# Patient Record
Sex: Female | Born: 1965 | Race: White | Hispanic: No | Marital: Married | Smoking: Never smoker
Health system: Southern US, Community
[De-identification: ages and names within clinical notes are randomized; demographics above are authoritative.]

## PROBLEM LIST (undated history)

## (undated) DIAGNOSIS — Z973 Presence of spectacles and contact lenses: Secondary | ICD-10-CM

## (undated) DIAGNOSIS — Z8619 Personal history of other infectious and parasitic diseases: Secondary | ICD-10-CM

## (undated) DIAGNOSIS — D649 Anemia, unspecified: Secondary | ICD-10-CM

## (undated) DIAGNOSIS — H3320 Serous retinal detachment, unspecified eye: Secondary | ICD-10-CM

## (undated) DIAGNOSIS — J189 Pneumonia, unspecified organism: Secondary | ICD-10-CM

## (undated) DIAGNOSIS — H9191 Unspecified hearing loss, right ear: Secondary | ICD-10-CM

## (undated) DIAGNOSIS — K219 Gastro-esophageal reflux disease without esophagitis: Secondary | ICD-10-CM

## (undated) DIAGNOSIS — H269 Unspecified cataract: Secondary | ICD-10-CM

## (undated) HISTORY — PX: OTHER SURGICAL HISTORY: SHX169

## (undated) HISTORY — PX: ABLATION: SHX5711

## (undated) HISTORY — DX: Unspecified cataract: H26.9

## (undated) HISTORY — PX: EXPLORATORY LAPAROTOMY: SUR591

## (undated) HISTORY — DX: Personal history of other infectious and parasitic diseases: Z86.19

## (undated) HISTORY — PX: ABDOMINAL EXPLORATION SURGERY: SHX538

## (undated) HISTORY — PX: DENTAL SURGERY: SHX609

## (undated) HISTORY — DX: Serous retinal detachment, unspecified eye: H33.20

## (undated) HISTORY — DX: Anemia, unspecified: D64.9

## (undated) HISTORY — DX: Unspecified hearing loss, right ear: H91.91

---

## 2014-12-26 ENCOUNTER — Encounter: Payer: Self-pay | Admitting: Family Medicine

## 2014-12-26 ENCOUNTER — Ambulatory Visit (INDEPENDENT_AMBULATORY_CARE_PROVIDER_SITE_OTHER): Payer: Federal, State, Local not specified - PPO | Admitting: Family Medicine

## 2014-12-26 VITALS — BP 129/84 | HR 90 | Temp 98.2°F | Resp 20 | Ht 62.75 in | Wt 162.5 lb

## 2014-12-26 DIAGNOSIS — E669 Obesity, unspecified: Secondary | ICD-10-CM | POA: Insufficient documentation

## 2014-12-26 DIAGNOSIS — R5382 Chronic fatigue, unspecified: Secondary | ICD-10-CM

## 2014-12-26 DIAGNOSIS — L659 Nonscarring hair loss, unspecified: Secondary | ICD-10-CM | POA: Diagnosis not present

## 2014-12-26 DIAGNOSIS — R5383 Other fatigue: Secondary | ICD-10-CM | POA: Insufficient documentation

## 2014-12-26 DIAGNOSIS — Z6829 Body mass index (BMI) 29.0-29.9, adult: Secondary | ICD-10-CM | POA: Diagnosis not present

## 2014-12-26 DIAGNOSIS — Z299 Encounter for prophylactic measures, unspecified: Secondary | ICD-10-CM

## 2014-12-26 DIAGNOSIS — Z0000 Encounter for general adult medical examination without abnormal findings: Secondary | ICD-10-CM | POA: Insufficient documentation

## 2014-12-26 DIAGNOSIS — E611 Iron deficiency: Secondary | ICD-10-CM | POA: Insufficient documentation

## 2014-12-26 NOTE — Progress Notes (Signed)
Subjective:    Patient ID: Gail Duncan, female    DOB: [birth year], 49 y.o.   MRN: [id number]  HPI  Patient presents for new patient establishment with multiple complaints. All past medical history, surgical history, allergies, family history, immunizations and social history was obtained from the patient today and entered into the electronic medical record. Records are requested from her prior PCP, and will be reviewed at the time they are received. All medical records will be updated at that time.  Dysphagia: Patient states that she has difficulty at times swallowing her meals. She states this is intermittent in nature. She states happened a few days ago when she took a bite of chicken it felt like it got caught in the bottom portion, points to lower third esophagus, hung in location for a few minutes, and then she was able to swallow completely. Patient denies choking on solids or liquids.  Iron deficiency: Patient states that she has had a chronic history of being iron deficient. She had uterine ablation surgery in 2014, and feels this has improved her iron deficiency. She stopped taking her iron pills a few weeks ago, she was taking iron at least 4-5 times a week. She is now just taking a multivitamin. Patient reports her menstrual cycles occur every 28 days, last for 3 days are light. She does admit to a 1 day of spotting around day 15 of her period.  Fatigue: Patient reports fatigue that has been worsening over the last few months. She endorses hair loss. She denies constipation, or increasing dry skin. She does have history of iron deficiency anemia, she was on iron supplementation until 1-2 weeks ago. She does take a daily vitamin. She does exercise. 3 times a week. She states she eats a healthy diet. She denies any chest pain, admits to not feeling like she can't catch her breath fully at times, she denies any dyspnea on exertion, lower leg edema, palpitations, constipation or  diarrhea.  Health maintenance:  Colonoscopy: 2012, screened for bleeding and internal hemorrhoids. Next due at age 64.  Mammogram: 2015,  Indicated, a family history, no personal history Cervical cancer screening: 2012, patient states she has had history of abnormal Paps. She has had at least 2 normal Paps consecutively. Her last Pap smear was in 2012. She is searching for a gynecologist in the area. Immunizations: need records, flu indicated Infectious disease screening: HIV indicated  Past Medical History  Diagnosis Date  . Anemia   . History of chicken pox    Allergies  Allergen Reactions  . Asa [Aspirin] Other (See Comments)    Contraindicated due to medical condition   Past Surgical History  Procedure Laterality Date  . Abdominal exploration surgery    . Ablation    . Dental surgery    . Exploratory laparotomy      "fibrous"   Family History  Problem Relation Age of Onset  . Diabetes Mother   . Prostate cancer Father   . Hypertension Father   . Lung cancer Paternal Grandfather    Social History   Social History  . Marital Status: Married    Spouse Name: N/A  . Number of Children: N/A  . Years of Education: N/A   Occupational History  . Not on file.   Social History Main Topics  . Smoking status: Never Smoker   . Smokeless tobacco: Not on file  . Alcohol Use: 0.0 oz/week    0 Standard drinks or equivalent per week  Comment: occasional social use  . Drug Use: No  . Sexual Activity: Yes   Other Topics Concern  . 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 Bachelor's degree    Moved to [place], from [place] in July 2016.    EtOH use occasional,, nonsmoker, denies recreational drugs.   Drinks caffeinated beverages, uses herbal remedies, takes daily vitamins   Patient wears her seatbelt, wears a bike,, exercises at least 3 times a week. There is a smoke alarm in her home.   Review of Systems  Constitutional:  Positive for fatigue. Negative for fever, chills, diaphoresis, activity change, appetite change and unexpected weight change.  HENT: Positive for hearing loss and trouble swallowing. Negative for congestion, dental problem, mouth sores, nosebleeds, postnasal drip, rhinorrhea, sinus pressure, sneezing, sore throat, tinnitus and voice change.   Eyes: Negative for redness and visual disturbance.  Respiratory: Positive for chest tightness. Negative for apnea, cough, choking, shortness of breath, wheezing and stridor.   Cardiovascular: Negative for chest pain, palpitations and leg swelling.  Gastrointestinal: Negative for abdominal pain, diarrhea and constipation.  Endocrine: Negative for polydipsia, polyphagia and polyuria.  Genitourinary: Positive for menstrual problem. Negative for dysuria, frequency and difficulty urinating.  Musculoskeletal: Negative for myalgias, arthralgias and gait problem.  Skin: Negative for rash.  Allergic/Immunologic: Negative for immunocompromised state.  Neurological: Negative for dizziness, tremors, weakness, light-headedness, numbness and headaches.  Hematological: Negative for adenopathy.  Psychiatric/Behavioral: Negative for confusion, dysphoric mood, decreased concentration and agitation. The patient is not nervous/anxious.        Objective:   Physical Exam BP 129/84 mmHg  Pulse 90  Temp(Src) 98.2 F (36.8 C) (Oral)  Resp 20  Ht 5' 2.75" (1.594 m)  Wt 162 lb 8 oz (73.71 kg)  BMI 29.01 kg/m2  SpO2 100%  LMP 12/22/2014 Gen: Afebrile. No acute distress. Nontoxic in appearance, well-developed, well-nourished, Caucasian female. HENT: AT. [place]. Bilateral TM visualized and normal in appearance. MMM. Bilateral nares mild erythema. Throat without erythema or exudates. No cough on exam, no hoarseness on exam. Eyes:Pupils Equal Round Reactive to light, Extraocular movements intact,  Conjunctiva without redness, discharge or icterus. Neck/lymp/endocrine: Supple, no  lymphadenopathy, no thyromegaly CV: RRR, no edema, +2/4 P posterior tibialis pulses Chest: CTAB, no wheeze or crackles Abd: Soft. Round. NTND. BS present. No Masses palpated.  Neuro: Normal gait. PERLA. EOMi. Alert. Orientedx3 Psych: Normal affect, dress and demeanor. Normal speech. Normal thought content and judgment..      Assessment & Plan:  1. Iron deficiency - Ferritin; Future - IBC panel; Future - CBC w/Diff; Future  2. Chronic fatigue - TSH; Future - B12; Future - Vitamin D (25 hydroxy); Future - Comp Met (CMET); Future  3. Hair loss - DDX: Vitamin D deficiency, thyroid disease, stress - TSH and vitamin D ordered.  4. BMI 29.0-29.9,adult - HgB A1c; Future - Lipid panel; Future  5. Preventive measure - MM DIGITAL SCREENING BILATERAL; Future Health maintenance:  Colonoscopy: 2012, screened for bleeding and internal hemorrhoids. Next due at age 82.  Mammogram: 2015,  Indicated, a family history, no personal history. Mammogram ordered today. Cervical cancer screening: 2012, patient states she has had history of abnormal Paps. She has had at least 2 normal Paps consecutively. Her last Pap smear was in 2012. She is searching for a gynecologist in the area. Immunizations: need records, flu declined 2016 Infectious disease screening: HIV declined 2016  Follow up yearly, unless labs indicate need to  be seen sooner, per patient she needs to be sooner.

## 2014-12-26 NOTE — Patient Instructions (Signed)
Health Maintenance, Female Adopting a healthy lifestyle and getting preventive care can go a long way to promote health and wellness. Talk with your health care provider about what schedule of regular examinations is right for you. This is a good chance for you to check in with your provider about disease prevention and staying healthy. In between checkups, there are plenty of things you can do on your own. Experts have done a lot of research about which lifestyle changes and preventive measures are most likely to keep you healthy. Ask your health care provider for more information. WEIGHT AND DIET  Eat a healthy diet  Be sure to include plenty of vegetables, fruits, low-fat dairy products, and lean protein.  Do not eat a lot of foods high in solid fats, added sugars, or salt.  Get regular exercise. This is one of the most important things you can do for your health.  Most adults should exercise for at least 150 minutes each week. The exercise should increase your heart rate and make you sweat (moderate-intensity exercise).  Most adults should also do strengthening exercises at least twice a week. This is in addition to the moderate-intensity exercise.  Maintain a healthy weight  Body mass index (BMI) is a measurement that can be used to identify possible weight problems. It estimates body fat based on height and weight. Your health care provider can help determine your BMI and help you achieve or maintain a healthy weight.  For females 20 years of age and older:   A BMI below 18.5 is considered underweight.  A BMI of 18.5 to 24.9 is normal.  A BMI of 25 to 29.9 is considered overweight.  A BMI of 30 and above is considered obese.  Watch levels of cholesterol and blood lipids  You should start having your blood tested for lipids and cholesterol at 48 years of age, then have this test every 5 years.  You may need to have your cholesterol levels checked more often if:  Your lipid  or cholesterol levels are high.  You are older than 48 years of age.  You are at high risk for heart disease.  CANCER SCREENING   Lung Cancer  Lung cancer screening is recommended for adults 55-80 years old who are at high risk for lung cancer because of a history of smoking.  A yearly low-dose CT scan of the lungs is recommended for people who:  Currently smoke.  Have quit within the past 15 years.  Have at least a 30-pack-year history of smoking. A pack year is smoking an average of one pack of cigarettes a day for 1 year.  Yearly screening should continue until it has been 15 years since you quit.  Yearly screening should stop if you develop a health problem that would prevent you from having lung cancer treatment.  Breast Cancer  Practice breast self-awareness. This means understanding how your breasts normally appear and feel.  It also means doing regular breast self-exams. Let your health care provider know about any changes, no matter how small.  If you are in your 20s or 30s, you should have a clinical breast exam (CBE) by a health care provider every 1-3 years as part of a regular health exam.  If you are 40 or older, have a CBE every year. Also consider having a breast X-ray (mammogram) every year.  If you have a family history of breast cancer, talk to your health care provider about genetic screening.  If you   are at high risk for breast cancer, talk to your health care provider about having an MRI and a mammogram every year.  Breast cancer gene (BRCA) assessment is recommended for women who have family members with BRCA-related cancers. BRCA-related cancers include:  Breast.  Ovarian.  Tubal.  Peritoneal cancers.  Results of the assessment will determine the need for genetic counseling and BRCA1 and BRCA2 testing. Cervical Cancer Your health care provider may recommend that you be screened regularly for cancer of the pelvic organs (ovaries, uterus, and  vagina). This screening involves a pelvic examination, including checking for microscopic changes to the surface of your cervix (Pap test). You may be encouraged to have this screening done every 3 years, beginning at age 21.  For women ages 30-65, health care providers may recommend pelvic exams and Pap testing every 3 years, or they may recommend the Pap and pelvic exam, combined with testing for human papilloma virus (HPV), every 5 years. Some types of HPV increase your risk of cervical cancer. Testing for HPV may also be done on women of any age with unclear Pap test results.  Other health care providers may not recommend any screening for nonpregnant women who are considered low risk for pelvic cancer and who do not have symptoms. Ask your health care provider if a screening pelvic exam is right for you.  If you have had past treatment for cervical cancer or a condition that could lead to cancer, you need Pap tests and screening for cancer for at least 20 years after your treatment. If Pap tests have been discontinued, your risk factors (such as having a new sexual partner) need to be reassessed to determine if screening should resume. Some women have medical problems that increase the chance of getting cervical cancer. In these cases, your health care provider may recommend more frequent screening and Pap tests. Colorectal Cancer  This type of cancer can be detected and often prevented.  Routine colorectal cancer screening usually begins at 48 years of age and continues through 48 years of age.  Your health care provider may recommend screening at an earlier age if you have risk factors for colon cancer.  Your health care provider may also recommend using home test kits to check for hidden blood in the stool.  A small camera at the end of a tube can be used to examine your colon directly (sigmoidoscopy or colonoscopy). This is done to check for the earliest forms of colorectal  cancer.  Routine screening usually begins at age 50.  Direct examination of the colon should be repeated every 5-10 years through 48 years of age. However, you may need to be screened more often if early forms of precancerous polyps or small growths are found. Skin Cancer  Check your skin from head to toe regularly.  Tell your health care provider about any new moles or changes in moles, especially if there is a change in a mole's shape or color.  Also tell your health care provider if you have a mole that is larger than the size of a pencil eraser.  Always use sunscreen. Apply sunscreen liberally and repeatedly throughout the day.  Protect yourself by wearing long sleeves, pants, a wide-brimmed hat, and sunglasses whenever you are outside. HEART DISEASE, DIABETES, AND HIGH BLOOD PRESSURE   High blood pressure causes heart disease and increases the risk of stroke. High blood pressure is more likely to develop in:  People who have blood pressure in the high end   of the normal range (130-139/85-89 mm Hg).  People who are overweight or obese.  People who are African American.  If you are 38-23 years of age, have your blood pressure checked every 3-5 years. If you are 61 years of age or older, have your blood pressure checked every year. You should have your blood pressure measured twice--once when you are at a hospital or clinic, and once when you are not at a hospital or clinic. Record the average of the two measurements. To check your blood pressure when you are not at a hospital or clinic, you can use:  An automated blood pressure machine at a pharmacy.  A home blood pressure monitor.  If you are between 45 years and 39 years old, ask your health care provider if you should take aspirin to prevent strokes.  Have regular diabetes screenings. This involves taking a blood sample to check your fasting blood sugar level.  If you are at a normal weight and have a low risk for diabetes,  have this test once every three years after 48 years of age.  If you are overweight and have a high risk for diabetes, consider being tested at a younger age or more often. PREVENTING INFECTION  Hepatitis B  If you have a higher risk for hepatitis B, you should be screened for this virus. You are considered at high risk for hepatitis B if:  You were born in a country where hepatitis B is common. Ask your health care provider which countries are considered high risk.  Your parents were born in a high-risk country, and you have not been immunized against hepatitis B (hepatitis B vaccine).  You have HIV or AIDS.  You use needles to inject street drugs.  You live with someone who has hepatitis B.  You have had sex with someone who has hepatitis B.  You get hemodialysis treatment.  You take certain medicines for conditions, including cancer, organ transplantation, and autoimmune conditions. Hepatitis C  Blood testing is recommended for:  Everyone born from [birth year] through 1964.  Anyone with known risk factors for hepatitis C. Sexually transmitted infections (STIs)  You should be screened for sexually transmitted infections (STIs) including gonorrhea and chlamydia if:  You are sexually active and are younger than 48 years of age.  You are older than 48 years of age and your health care provider tells you that you are at risk for this type of infection.  Your sexual activity has changed since you were last screened and you are at an increased risk for chlamydia or gonorrhea. Ask your health care provider if you are at risk.  If you do not have HIV, but are at risk, it may be recommended that you take a prescription medicine daily to prevent HIV infection. This is called pre-exposure prophylaxis (PrEP). You are considered at risk if:  You are sexually active and do not regularly use condoms or know the HIV status of your partner(s).  You take drugs by injection.  You are sexually  active with a partner who has HIV. Talk with your health care provider about whether you are at high risk of being infected with HIV. If you choose to begin PrEP, you should first be tested for HIV. You should then be tested every 3 months for as long as you are taking PrEP.  PREGNANCY   If you are premenopausal and you may become pregnant, ask your health care provider about preconception counseling.  If you may  become pregnant, take 400 to 800 micrograms (mcg) of folic acid every day.  If you want to prevent pregnancy, talk to your health care provider about birth control (contraception). OSTEOPOROSIS AND MENOPAUSE   Osteoporosis is a disease in which the bones lose minerals and strength with aging. This can result in serious bone fractures. Your risk for osteoporosis can be identified using a bone density scan.  If you are 55 years of age or older, or if you are at risk for osteoporosis and fractures, ask your health care provider if you should be screened.  Ask your health care provider whether you should take a calcium or vitamin D supplement to lower your risk for osteoporosis.  Menopause may have certain physical symptoms and risks.  Hormone replacement therapy may reduce some of these symptoms and risks. Talk to your health care provider about whether hormone replacement therapy is right for you.  HOME CARE INSTRUCTIONS   Schedule regular health, dental, and eye exams.  Stay current with your immunizations.   Do not use any tobacco products including cigarettes, chewing tobacco, or electronic cigarettes.  If you are pregnant, do not drink alcohol.  If you are breastfeeding, limit how much and how often you drink alcohol.  Limit alcohol intake to no more than 1 drink per day for nonpregnant women. One drink equals 12 ounces of beer, 5 ounces of wine, or 1 ounces of hard liquor.  Do not use street drugs.  Do not share needles.  Ask your health care provider for help if  you need support or information about quitting drugs.  Tell your health care provider if you often feel depressed.  Tell your health care provider if you have ever been abused or do not feel safe at home.   This information is not intended to replace advice given to you by your health care provider. Make sure you discuss any questions you have with your health care provider.   Document Released: 09/01/2010 Document Revised: 03/09/2014 Document Reviewed: 01/18/2013 Elsevier Interactive Patient Education [employer].  F/U 1 year unless labs are abnormal, or need [place] sooner.

## 2014-12-27 ENCOUNTER — Other Ambulatory Visit: Payer: Federal, State, Local not specified - PPO | Admitting: Family Medicine

## 2014-12-28 ENCOUNTER — Other Ambulatory Visit (INDEPENDENT_AMBULATORY_CARE_PROVIDER_SITE_OTHER): Payer: Federal, State, Local not specified - PPO

## 2014-12-28 DIAGNOSIS — Z6829 Body mass index (BMI) 29.0-29.9, adult: Secondary | ICD-10-CM

## 2014-12-28 DIAGNOSIS — R5382 Chronic fatigue, unspecified: Secondary | ICD-10-CM

## 2014-12-28 DIAGNOSIS — E611 Iron deficiency: Secondary | ICD-10-CM | POA: Diagnosis not present

## 2014-12-28 LAB — COMPREHENSIVE METABOLIC PANEL
ALT: 16 U/L (ref 0–35)
AST: 18 U/L (ref 0–37)
Albumin: 4.2 g/dL (ref 3.5–5.2)
Alkaline Phosphatase: 64 U/L (ref 39–117)
BUN: 13 mg/dL (ref 6–23)
CHLORIDE: 106 meq/L (ref 96–112)
CO2: 29 meq/L (ref 19–32)
Calcium: 9.5 mg/dL (ref 8.4–10.5)
Creatinine, Ser: 0.7 mg/dL (ref 0.40–1.20)
GFR: 94.69 mL/min (ref >60.00)
GLUCOSE: 77 mg/dL (ref 70–99)
POTASSIUM: 4.7 meq/L (ref 3.5–5.1)
Sodium: 142 mEq/L (ref 135–145)
TOTAL PROTEIN: 7.1 g/dL (ref 6.0–8.3)
Total Bilirubin: 0.6 mg/dL (ref 0.2–1.2)

## 2014-12-28 LAB — CBC WITH DIFFERENTIAL/PLATELET
BASOS ABS: 0 10*3/uL (ref 0.0–0.1)
Basophils Relative: 0.6 % (ref 0.0–3.0)
EOS PCT: 0.8 % (ref 0.0–5.0)
Eosinophils Absolute: 0 10*3/uL (ref 0.0–0.7)
HEMATOCRIT: 39.3 % (ref 36.0–46.0)
HEMOGLOBIN: 13.1 g/dL (ref 12.0–15.0)
Lymphocytes Relative: 35.7 % (ref 12.0–46.0)
Lymphs Abs: 1.8 10*3/uL (ref 0.7–4.0)
MCHC: 33.4 g/dL (ref 30.0–36.0)
MCV: 87.9 fl (ref 78.0–100.0)
MONOS PCT: 6.2 % (ref 3.0–12.0)
Monocytes Absolute: 0.3 10*3/uL (ref 0.1–1.0)
NEUTROS PCT: 56.7 % (ref 43.0–77.0)
Neutro Abs: 2.8 10*3/uL (ref 1.4–7.7)
Platelets: 226 10*3/uL (ref 150.0–400.0)
RBC: 4.47 Mil/uL (ref 3.87–5.11)
RDW: 13.4 % (ref 11.5–15.5)
WBC: 4.9 10*3/uL (ref 4.0–10.5)

## 2014-12-28 LAB — VITAMIN D 25 HYDROXY (VIT D DEFICIENCY, FRACTURES): VITD: 33.17 ng/mL (ref 30.00–100.00)

## 2014-12-28 LAB — LIPID PANEL
CHOLESTEROL: 220 mg/dL — AB (ref 0–200)
HDL: 82.3 mg/dL (ref >39.00)
LDL Cholesterol: 126 mg/dL — ABNORMAL HIGH (ref 0–99)
NonHDL: 137.62
TRIGLYCERIDES: 56 mg/dL (ref 0.0–149.0)
Total CHOL/HDL Ratio: 3
VLDL: 11.2 mg/dL (ref 0.0–40.0)

## 2014-12-28 LAB — TSH: TSH: 1.08 u[IU]/mL (ref 0.35–4.50)

## 2014-12-28 LAB — IBC PANEL
Iron: 94 ug/dL (ref 42–145)
Saturation Ratios: 32.3 % (ref 20.0–50.0)
Transferrin: 208 mg/dL — ABNORMAL LOW (ref 212.0–360.0)

## 2014-12-28 LAB — VITAMIN B12: VITAMIN B 12: 303 pg/mL (ref 211–911)

## 2014-12-28 LAB — HEMOGLOBIN A1C: Hgb A1c MFr Bld: 4.8 % (ref 4.6–6.5)

## 2014-12-28 LAB — FERRITIN: Ferritin: 86.2 ng/mL (ref 10.0–291.0)

## 2015-01-01 ENCOUNTER — Telehealth: Payer: Self-pay | Admitting: Family Medicine

## 2015-01-01 MED ORDER — VITAMIN D (ERGOCALCIFEROL) 1.25 MG (50000 UNIT) PO CAPS: 50000.0000 [IU] | ORAL_CAPSULE | ORAL | Status: DC

## 2015-01-01 NOTE — Telephone Encounter (Signed)
Please call pt: - her labs returned Her cholesterol is elevated and she should be encouraged to take at least a fish oil supplementation. She should make dietary changes, more fresh fruit/veggies and lean meats. Try to exercise at least 3 times a week.  -  I would want to retest this in 6 months to make sure it is improvementing, if not would consider starting a prescribed mediation at that time.  - her ferritin is normal, her transferrin is very mildly low. She should continue to take iron supplementation to keep iron reserves normal.  - Vitamin B was low normal, she could benefit for higher dose supplementation (she currently takes muti with B12). Consider at least 1000-2000 mcg a week.  - Vit d was also low normal. Her records show 8000u daily drops, I am uncertain if she takes this daily. I would like to call in a prescribed dose at 50000u weekly for 12 weeks and then retest.  - Her thyroid is functioning normal.  - will need to retest D and B in 12 weeks.

## 2015-01-01 NOTE — Telephone Encounter (Signed)
Spoke with patient reviewed lab results and dietary instructions. Patient will start fish oil as instructed . Patient states she is willing to do Vit D 50000 units for 12 weeks. Patient will call to set up lab appt after completing 12 weeks of Vit D . Patient will have Vit D and B12 levels drawn. Patient will set up appt for 6 month recheck on lipids when she comes in for lab work in 6 weeks. Patient verbalized understanding of all instructions.

## 2015-01-23 ENCOUNTER — Ambulatory Visit
Admission: RE | Admit: 2015-01-23 | Discharge: 2015-01-23 | Disposition: A | Discharge status: Home / Self Care | Payer: Federal, State, Local not specified - PPO | Source: Ambulatory Visit | Attending: Family Medicine | Admitting: Family Medicine

## 2015-01-23 DIAGNOSIS — Z299 Encounter for prophylactic measures, unspecified: Secondary | ICD-10-CM

## 2015-12-27 ENCOUNTER — Encounter: Payer: Federal, State, Local not specified - PPO | Admitting: Family Medicine

## 2016-02-05 DIAGNOSIS — K080 Exfoliation of teeth due to systemic causes: Secondary | ICD-10-CM | POA: Diagnosis not present

## 2016-03-05 DIAGNOSIS — K080 Exfoliation of teeth due to systemic causes: Secondary | ICD-10-CM | POA: Diagnosis not present

## 2016-03-17 ENCOUNTER — Ambulatory Visit (INDEPENDENT_AMBULATORY_CARE_PROVIDER_SITE_OTHER): Payer: Federal, State, Local not specified - PPO | Admitting: Family Medicine

## 2016-03-17 ENCOUNTER — Encounter: Payer: Self-pay | Admitting: Family Medicine

## 2016-03-17 VITALS — BP 134/95 | HR 78 | Temp 98.0°F | Resp 16 | Wt 165.0 lb

## 2016-03-17 DIAGNOSIS — R590 Localized enlarged lymph nodes: Secondary | ICD-10-CM | POA: Diagnosis not present

## 2016-03-17 DIAGNOSIS — J029 Acute pharyngitis, unspecified: Secondary | ICD-10-CM

## 2016-03-17 MED ORDER — PREDNISONE 50 MG PO TABS: 50.0000 mg | ORAL_TABLET | Freq: Every day | ORAL | 0 refills | Status: DC

## 2016-03-17 MED ORDER — AMOXICILLIN-POT CLAVULANATE 875-125 MG PO TABS: 1.0000 | ORAL_TABLET | Freq: Two times a day (BID) | ORAL | 0 refills | Status: DC

## 2016-03-17 MED ORDER — METHYLPREDNISOLONE ACETATE 80 MG/ML IJ SUSP
80.0000 mg | Freq: Once | INTRAMUSCULAR | Status: AC
  Administered 2016-03-17: 80 mg via INTRAMUSCULAR

## 2016-03-17 NOTE — Progress Notes (Signed)
Pre visit review using our clinic review tool, if applicable. No additional management support is needed unless otherwise documented below in the visit note. 

## 2016-03-17 NOTE — Addendum Note (Signed)
Addended by: Thomasena EdisWILLIAMS, Shailyn Weyandt N on: 03/17/2016 04:00 PM   Modules accepted: Orders

## 2016-03-17 NOTE — Patient Instructions (Signed)
Steroid shot today, and then start steroid pill tomorrow (3 days only). Start Augmentin today, every 12 hours for 10 days. Rest and hydrate.     Pharyngitis Pharyngitis is a sore throat (pharynx). There is redness, pain, and swelling of your throat. Follow these instructions at home:  Drink enough fluids to keep your pee (urine) clear or pale yellow.  Only take medicine as told by your doctor.  You may get sick again if you do not take medicine as told. Finish your medicines, even if you start to feel better.  Do not take aspirin.  Rest.  Rinse your mouth (gargle) with salt water ( tsp of salt per 1 qt of water) every 1-2 hours. This will help the pain.  If you are not at risk for choking, you can suck on hard candy or sore throat lozenges. Contact a doctor if:  You have large, tender lumps on your neck.  You have a rash.  You cough up green, yellow-brown, or bloody spit. Get help right away if:  You have a stiff neck.  You drool or cannot swallow liquids.  You throw up (vomit) or are not able to keep medicine or liquids down.  You have very bad pain that does not go away with medicine.  You have problems breathing (not from a stuffy nose). This information is not intended to replace advice given to you by your health care provider. Make sure you discuss any questions you have with your health care provider. Document Released: 08/05/2007 Document Revised: 07/25/2015 Document Reviewed: 10/24/2012 Elsevier Interactive Patient Education  2017 [employer]

## 2016-03-17 NOTE — Progress Notes (Signed)
Gail Duncan , Oct 18, 1965, 51 y.o., female MRN: [id number] Patient Care Team    Relationship Specialty Notifications Start End  Natalia Leatherwood, DO PCP - General Family Medicine  12/19/14     CC: Sore throat Subjective: Pt presents for an acute OV with complaints of sore throat of x 10 days duration.  Patient states she started with a fever about 10-12 days ago and started an abx she was given by her brother (Clarithromycin) . She states she started to feel better but then her throat became worse. She felt  a sensation of something being stuck in her throat last night and noticed swollen tonsils. She reports just not feeling like herself the last two days. She is tolerating food/liquid today.   Allergies  Allergen Reactions  . Asa [Aspirin] Other (See Comments)    Contraindicated due to medical condition   Social History  Substance Use Topics  . Smoking status: Never Smoker  . Smokeless tobacco: Never Used  . Alcohol use 0.0 oz/week     Comment: occasional social use   Past Medical History:  Diagnosis Date  . Anemia   . Hearing loss in right ear    Unknown cause, had full work up with Specialist in [place]  . History of chicken pox    Past Surgical History:  Procedure Laterality Date  . ABDOMINAL EXPLORATION SURGERY    . ABLATION    . DENTAL SURGERY    . EXPLORATORY LAPAROTOMY     "fibrous"   Family History  Problem Relation Age of Onset  . Diabetes Mother   . Prostate cancer Father   . Hypertension Father   . Lung cancer Paternal Grandfather    Allergies as of 03/17/2016      Reactions   Asa [aspirin] Other (See Comments)   Contraindicated due to medical condition      Medication List       Accurate as of 03/17/16  3:29 PM. Always use your most recent med list.          COQ10-VITAMIN E PO Take 1,280 mg by mouth.   ergocalciferol 8000 UNIT/ML drops Commonly known as:  DRISDOL Take by mouth daily.   Vitamin D (Ergocalciferol) 50000 units Caps  capsule Commonly known as:  DRISDOL Take 1 capsule (50,000 Units total) by mouth every 7 (seven) days.   IRON 100 PLUS PO Take by mouth.   multivitamin capsule Take 1 capsule by mouth daily.       No results found for this or any previous visit (from the past 24 hour(s)). No results found.   ROS: Negative, with the exception of above mentioned in HPI   Objective:  BP (!) 134/95 (BP Location: Left Arm, Patient Position: Sitting, Cuff Size: Normal)   Pulse 78   Temp 98 F (36.7 C) (Oral)   Resp 16   Wt 165 lb (74.8 kg)   SpO2 97%   BMI 29.46 kg/m  Body mass index is 29.46 kg/m. Gen: Afebrile. No acute distress. Nontoxic in appearance, well developed, well nourished.  HENT: AT. [place]. Bilateral TM visualized wnl. MMM, no oral lesions. Bilateral nares with mild erythema, no swelling, mild drainage. Throat without erythema or exudates. No cough. Hoarseness present, enlarged tonsils.  Eyes:Pupils Equal Round Reactive to light, Extraocular movements intact,  Conjunctiva without redness, discharge or icterus. Neck/lymp/endocrine: Supple,large right ant cervical lymphadenopathy CV: RRR Chest: CTAB, no wheeze or crackles. Good air movement, normal resp effort.  Skin: no rashes, purpura  or petechiae.   Assessment/Plan: Gail Duncan is a 51 y.o. female present for acute OV for  Lymphadenopathy, cervical Acute pharyngitis, unspecified etiology - rest, hydrate, fluids.  - STOP clarithromycin and start Augmentin today.  - IM depo medrol provided today and start prednisone tomorrow for 3 days.  - F/u PRN   electronically signed by:  Felix Pacinienee Kuneff, DO  [facility] [place]

## 2016-07-17 ENCOUNTER — Encounter: Payer: Self-pay | Admitting: Family Medicine

## 2016-07-17 ENCOUNTER — Ambulatory Visit (INDEPENDENT_AMBULATORY_CARE_PROVIDER_SITE_OTHER): Payer: Federal, State, Local not specified - PPO | Admitting: Family Medicine

## 2016-07-17 VITALS — BP 113/77 | HR 75 | Temp 98.1°F | Resp 20 | Wt 161.5 lb

## 2016-07-17 DIAGNOSIS — J029 Acute pharyngitis, unspecified: Secondary | ICD-10-CM | POA: Diagnosis not present

## 2016-07-17 DIAGNOSIS — R591 Generalized enlarged lymph nodes: Secondary | ICD-10-CM | POA: Diagnosis not present

## 2016-07-17 MED ORDER — AMOXICILLIN-POT CLAVULANATE 875-125 MG PO TABS: 1.0000 | ORAL_TABLET | Freq: Two times a day (BID) | ORAL | 0 refills | Status: DC

## 2016-07-17 NOTE — Progress Notes (Signed)
Gail Duncan , [birth year], 51 y.o., female MRN: [id number] Patient Care Team    Relationship Specialty Notifications Start End  Natalia Leatherwood, DO PCP - General Family Medicine  12/19/14     Chief Complaint  Patient presents with  . Adenopathy    right side neck    Subjective:  Gail Duncan is a 52 y.o. female present for sore throat and swollen gland on the right for 5 days. She endorses fever at night last 2 nights. No weight toss. She denies nausea, vomit, diarrhea, cough, headache, congestion. She had a similar episode in January that did completely resolve with prednisone and Augmentin. She is not on an allergy regimen.  Allergies  Allergen Reactions  . Asa [Aspirin] Other (See Comments)    Contraindicated due to medical condition   Social History  Substance Use Topics  . Smoking status: Never Smoker  . Smokeless tobacco: Never Used  . Alcohol use 0.0 oz/week     Comment: occasional social use   Past Medical History:  Diagnosis Date  . Anemia   . Hearing loss in right ear    Unknown cause, had full work up with Specialist in [place]  . History of chicken pox    Past Surgical History:  Procedure Laterality Date  . ABDOMINAL EXPLORATION SURGERY    . ABLATION    . DENTAL SURGERY    . EXPLORATORY LAPAROTOMY     "fibrous"   Family History  Problem Relation Age of Onset  . Diabetes Mother   . Prostate cancer Father   . Hypertension Father   . Lung cancer Paternal Grandfather    Allergies as of 07/17/2016      Reactions   Asa [aspirin] Other (See Comments)   Contraindicated due to medical condition      Medication List       Accurate as of 07/17/16  9:35 AM. Always use your most recent med list.          COQ10-VITAMIN E PO Take 1,280 mg by mouth.   FLAXSEED OIL PO Take by mouth.   IRON 100 PLUS PO Take by mouth.   Vitamin D3 5000 units Caps Take 1 capsule by mouth daily.       No results found for this or any previous visit (from the past 24  hour(s)). No results found.   ROS: Negative, with the exception of above mentioned in HPI   Objective:  BP 113/77 (BP Location: Right Arm, Patient Position: Sitting, Cuff Size: Large)   Pulse 75   Temp 98.1 F (36.7 C)   Resp 20   Wt 161 lb 8 oz (73.3 kg)   SpO2 100%   BMI 28.84 kg/m  Body mass index is 28.84 kg/m. Gen: Afebrile. No acute distress. Nontoxic looks well.  HENT: AT. [place]. Bilateral TM visualized and normal in appearance. MMM. Bilateral nares with erythema and drainage R>L. Throat without erythema or exudates. No cough, no hoarseness.  Eyes:Pupils Equal Round Reactive to light, Extraocular movements intact,  Conjunctiva without redness, discharge or icterus. Neck/lymp/endocrine: Supple, small right ant cervical lymphadenopathy CV: RRR, no edema, +2/4 P posterior tibialis pulses Chest: CTAB, no wheeze or crackles Abd: Soft. NTND. BS present.  Skin: no rashes, purpura or petechiae.  Neuro:  Normal gait. PERLA. EOMi. Alert. Oriented x3  Assessment/Plan: Gail Duncan is a 51 y.o. female present for acute OV for  Lymphadenopathy, cervical Acute pharyngitis, unspecified etiology - Second case of lymphadenitis in 6 months. Explained  to pt if completely resolves with treatment, it is secondary to  illness. If does not resolve then we would want to investigate further.  - rest, hydrate, fluids.  - Augmentin prescribed.  - mucinex and antihistamine encouraged.   - F/u 3-4 weeks if lymph node not resolved with abx--> then image   electronically signed by:  Felix Pacinienee Alanea Woolridge, DO  [facility] [place]

## 2016-07-17 NOTE — Patient Instructions (Signed)
- F/u 3-4 weeks if lymph node not resolved with antibiotics     Lymphangitis, Adult Lymphangitis is inflammation of one or more lymph vessels. This condition is usually caused by a bacterial infection. The lymphatic system is part of the body's defense system (immune system). It is a network of vessels, glands, and organs that carry fluid (lymph) and other substances around the body. Lymph vessels drain into glands called lymph nodes. These nodes filter bacteria and waste products from lymph. Lymphangitis usually develops when bacteria spreads to the lymphatic system from an infected wound or scrape on the skin of the arms or legs. It can also result from a skin infection (cellulitis) that spreads to the lymph nodes. Lymphangitis can spread quickly through your lymph system and into your blood (bacteremia). What are the causes? This condition is usually caused by bacteria that get into the lymph vessels. Most often, this is streptococcus bacteria. In some cases, staphylococcus bacteria may be the cause. Many other types of bacteria can also cause lymphangitis, but that is rare. What increases the risk? The following factors may make you more likely to develop this condition:  Being female. Men are more likely to get lymphangitis caused by cellulitis.  Having a decreased ability to fight infection or a weakened immune system.  Having diabetes.  Taking drugs that suppress the immune system.  Having chickenpox.  Being weak from another illness. What are the signs or symptoms? The most common symptom of lymphangitis is a wound or skin infection that develops red streaks in the skin. These are the infected lymph vessels. The red streaks will extend toward the lymph nodes that drain the vessels. Other symptoms may include:  Warmth and tenderness over the streaks.  Throbbing pain.  Swollen and tender lymph nodes.  For arm infections, these will be under the arm.  For leg infections, these  will be in the groin area.  Fever.  Chills.  Headache.  Appetite loss.  Muscle aches.  Fast pulse. How is this diagnosed? This condition may be diagnosed based on your symptoms and a physical exam. You may also have tests, such as:  Blood tests to check for an increase in white blood cells.  Blood cultures to look for bacteremia.  Culture and sensitivity testing. This is a test to find out what type of bacteria will grow from a sample of pus swabbed from the wound or skin infection. The results help determine which antibiotic medicines will kill the bacteria. How is this treated? Treatment for this condition may include:  Antibiotics.  You may be started on an antibiotic that is known to kill both streptococcus and staphylococcus bacteria.  Your antibiotics may need to be switched if tests show that your condition is caused by another type of bacteria.  If your infection is very bad or has spread to another area of your body, you may need to get antibiotics through an IV at the hospital.  Pain medicine.  Incision and drainage. This is a procedure that may be done at the hospital if pus needs to be drained from your wound. Follow these instructions at home:  Take over-the-counter and prescription medicines only as told by your health care provider.  Take your antibiotic medicine as told by your health care provider. Do not stop taking the antibiotic even if you start to feel better.  Rest at home until your health care provider says that you can return to your normal activities.  Follow instructions from your  health care provider about how to take care of your wound.  Raise (elevate) the affected area above the level of your heart while you are sitting or lying down.  Keep all follow-up visits as told by your health care provider. This is important. Contact a health care provider if:  You have chills or a fever.  Your symptoms do not go away with treatment.  Your  symptoms come back after treatment. This information is not intended to replace advice given to you by your health care provider. Make sure you discuss any questions you have with your health care provider. Document Released: 03/15/2015 Document Revised: 07/25/2015 Document Reviewed: 03/07/2014 Elsevier Interactive Patient Education  2017 [employer]

## 2016-08-25 ENCOUNTER — Encounter: Payer: Self-pay | Admitting: Family Medicine

## 2016-08-25 ENCOUNTER — Telehealth: Payer: Self-pay | Admitting: Family Medicine

## 2016-08-25 ENCOUNTER — Ambulatory Visit (INDEPENDENT_AMBULATORY_CARE_PROVIDER_SITE_OTHER): Payer: Federal, State, Local not specified - PPO | Admitting: Family Medicine

## 2016-08-25 VITALS — BP 128/87 | HR 71 | Temp 98.1°F | Resp 20 | Ht 63.0 in | Wt 167.8 lb

## 2016-08-25 DIAGNOSIS — R635 Abnormal weight gain: Secondary | ICD-10-CM | POA: Diagnosis not present

## 2016-08-25 DIAGNOSIS — R590 Localized enlarged lymph nodes: Secondary | ICD-10-CM | POA: Diagnosis not present

## 2016-08-25 DIAGNOSIS — R5383 Other fatigue: Secondary | ICD-10-CM

## 2016-08-25 LAB — CBC WITH DIFFERENTIAL/PLATELET
Basophils Absolute: 0 10*3/uL (ref 0.0–0.1)
Basophils Relative: 0.5 % (ref 0.0–3.0)
Eosinophils Absolute: 0 10*3/uL (ref 0.0–0.7)
Eosinophils Relative: 0.7 % (ref 0.0–5.0)
HCT: 38.7 % (ref 36.0–46.0)
Hemoglobin: 13.1 g/dL (ref 12.0–15.0)
LYMPHS ABS: 1.5 10*3/uL (ref 0.7–4.0)
LYMPHS PCT: 31.9 % (ref 12.0–46.0)
MCHC: 33.9 g/dL (ref 30.0–36.0)
MCV: 88.2 fl (ref 78.0–100.0)
MONO ABS: 0.3 10*3/uL (ref 0.1–1.0)
MONOS PCT: 5.8 % (ref 3.0–12.0)
NEUTROS ABS: 3 10*3/uL (ref 1.4–7.7)
NEUTROS PCT: 61.1 % (ref 43.0–77.0)
PLATELETS: 227 10*3/uL (ref 150.0–400.0)
RBC: 4.38 Mil/uL (ref 3.87–5.11)
RDW: 12.9 % (ref 11.5–15.5)
WBC: 4.8 10*3/uL (ref 4.0–10.5)

## 2016-08-25 LAB — TSH: TSH: 1.47 u[IU]/mL (ref 0.35–4.50)

## 2016-08-25 NOTE — Telephone Encounter (Signed)
Please call pt: - her labs are normal. US order was placed during her visit yesterday, they will call to schedule and once we get the report we will call her back.

## 2016-08-25 NOTE — Patient Instructions (Signed)
Pleasure seeing you today.  You will get a call to schedule your Ultrasound of your neck and we will call with results of labs and ultrasound once we get the results.   Lymphadenopathy Lymphadenopathy refers to swollen or enlarged lymph glands, also called lymph nodes. Lymph glands are part of your body's defense (immune) system, which protects the body from infections, germs, and diseases. Lymph glands are found in many locations in your body, including the neck, underarm, and groin. Many things can cause lymph glands to become enlarged. When your immune system responds to germs, such as viruses or bacteria, infection-fighting cells and fluid build up. This causes the glands to grow in size. Usually, this is not something to worry about. The swelling and any soreness often go away without treatment. However, swollen lymph glands can also be caused by a number of diseases. Your health care provider may do various tests to help determine the cause. If the cause of your swollen lymph glands cannot be found, it is important to monitor your condition to make sure the swelling goes away. Follow these instructions at home: Watch your condition for any changes. The following actions may help to lessen any discomfort you are feeling:  Get plenty of rest.  Take medicines only as directed by your health care provider. Your health care provider may recommend over-the-counter medicines for pain.  Apply moist heat compresses to the site of swollen lymph nodes as directed by your health care provider. This can help reduce any pain.  Check your lymph nodes daily for any changes.  Keep all follow-up visits as directed by your health care provider. This is important.  Contact a health care provider if:  Your lymph nodes are still swollen after 2 weeks.  Your swelling increases or spreads to other areas.  Your lymph nodes are hard, seem fixed to the skin, or are growing rapidly.  Your skin over the lymph  nodes is red and inflamed.  You have a fever.  You have chills.  You have fatigue.  You develop a sore throat.  You have abdominal pain.  You have weight loss.  You have night sweats. Get help right away if:  You notice fluid leaking from the area of the enlarged lymph node.  You have severe pain in any area of your body.  You have chest pain.  You have shortness of breath. This information is not intended to replace advice given to you by your health care provider. Make sure you discuss any questions you have with your health care provider. Document Released: 11/26/2007 Document Revised: 07/25/2015 Document Reviewed: 09/21/2013 Elsevier Interactive Patient Education  [employer]2018 Elsevier Inc.

## 2016-08-25 NOTE — Progress Notes (Signed)
Gail Duncan , 18-Dec-1965, 51 y.o., female MRN: [id number] Patient Care Team    Relationship Specialty Notifications Start End  Natalia LeatherwoodKuneff, Madaleine Simmon A, DO PCP - General Family Medicine  12/19/14     Chief Complaint  Patient presents with  . Adenopathy    right    Subjective:  Gail Duncan is a 51 y.o. female present for continued lymphadenopathy/swollen lymph node on the right side of her neck just under her jaw line for >7 weeks. She endorses it is painful when exercising or when she touches it. She feels her neck feels more full on that side. She endorses fatigue and weight gain. She denies fever, chills, nausea, vomit or rash. She denies mouth/teeth pain, red gums or drainage. She has issues with her right ear for a few ears and some hearing loss, that has been worked up by another provider (ENT) with a CT and no cause was established.  Pt has never smoked.  Fhx: prostate cancer and lung cancer.   Prior note 07/17/2016: sore throat and swollen gland on the right for 5 days. She endorses fever at night last 2 nights. No weight toss. She denies nausea, vomit, diarrhea, cough, headache, congestion. She had a similar episode in January that did completely resolve with prednisone and Augmentin. She is not on an allergy regimen.    Allergies  Allergen Reactions  . Asa [Aspirin] Other (See Comments)    Contraindicated due to medical condition   Social History  Substance Use Topics  . Smoking status: Never Smoker  . Smokeless tobacco: Never Used  . Alcohol use 0.0 oz/week     Comment: occasional social use   Past Medical History:  Diagnosis Date  . Anemia   . Hearing loss in right ear    Unknown cause, had full work up with Specialist in [place]  . History of chicken pox    Past Surgical History:  Procedure Laterality Date  . ABDOMINAL EXPLORATION SURGERY    . ABLATION    . DENTAL SURGERY    . EXPLORATORY LAPAROTOMY     "fibrous"   Family History  Problem Relation Age of Onset  .  Diabetes Mother   . Prostate cancer Father   . Hypertension Father   . Lung cancer Paternal Grandfather    Allergies as of 08/25/2016      Reactions   Asa [aspirin] Other (See Comments)   Contraindicated due to medical condition      Medication List       Accurate as of 08/25/16  9:30 AM. Always use your most recent med list.          COQ10-VITAMIN E PO Take 1,280 mg by mouth.   FLAXSEED OIL PO Take by mouth.   IRON 100 PLUS PO Take by mouth.   magnesium 30 MG tablet Take 30 mg by mouth 2 (two) times daily.   Vitamin D3 5000 units Caps Take 1 capsule by mouth daily.       No results found for this or any previous visit (from the past 24 hour(s)). No results found.   ROS: Negative, with the exception of above mentioned in HPI   Objective:  BP 128/87 (BP Location: Right Arm, Patient Position: Sitting, Cuff Size: Normal)   Pulse 71   Temp 98.1 F (36.7 C)   Resp 20   Ht 5\' 3"  (1.6 m)   Wt 167 lb 12 oz (76.1 kg)   SpO2 99%   BMI 29.72 kg/m  Body mass index is 29.72 kg/m. Gen: Afebrile. No acute distress. Nontoxic looks well.  HENT: AT. [place]. Bilateral TM visualized and normal in appearance. MMM. Bilateral nares with erythema and drainage R>L. Throat without erythema or exudates. No cough, no hoarseness.  Eyes:Pupils Equal Round Reactive to light, Extraocular movements intact,  Conjunctiva without redness, discharge or icterus. Neck/lymp/endocrine: Supple, small right ant cervical and moderate right submandibular lymphadenopathy CV: RRR, no edema, +2/4 P posterior tibialis pulses Chest: CTAB, no wheeze or crackles Abd: Soft. NTND. BS present.  Skin: no rashes, purpura or petechiae.  Neuro:  Normal gait. PERLA. EOMi. Alert. Oriented x3  Assessment/Plan: Rateel Beldin is a 51 y.o. female present for acute OV for  Lymphadenopathy, cervical Other fatigue Lymphadenopathy of right cervical region - lymphadenopathy present > 7 weeks, did not respond to abx and  steroids, with new onset fatigue.  - US Soft Tissue Head/Neck; Future - CBC w/Diff - TSH - F/U dependent on image/lab results.   Orders Placed This Encounter  Procedures  . US Soft Tissue Head/Neck  . CBC w/Diff  . TSH      electronically signed by:  Felix Pacini, DO   [facility] [place]

## 2016-08-26 NOTE — Telephone Encounter (Signed)
Detailed message left on voice mail. Okay per DPR. 

## 2016-08-27 ENCOUNTER — Encounter: Payer: Self-pay | Admitting: Family Medicine

## 2016-09-07 ENCOUNTER — Encounter: Payer: Self-pay | Admitting: *Deleted

## 2016-09-07 ENCOUNTER — Ambulatory Visit
Admission: RE | Admit: 2016-09-07 | Discharge: 2016-09-07 | Disposition: A | Discharge status: Home / Self Care | Payer: Federal, State, Local not specified - PPO | Source: Ambulatory Visit | Attending: Family Medicine | Admitting: Family Medicine

## 2016-09-07 ENCOUNTER — Telehealth: Payer: Self-pay | Admitting: Family Medicine

## 2016-09-07 DIAGNOSIS — R5383 Other fatigue: Secondary | ICD-10-CM

## 2016-09-07 DIAGNOSIS — R590 Localized enlarged lymph nodes: Secondary | ICD-10-CM | POA: Diagnosis not present

## 2016-09-07 NOTE — Telephone Encounter (Signed)
Left message with results on patient voice mail per DPR Also. [facility]

## 2016-09-07 NOTE — Telephone Encounter (Signed)
Please call pt: - her US of her neck was normal.

## 2016-09-21 DIAGNOSIS — K080 Exfoliation of teeth due to systemic causes: Secondary | ICD-10-CM | POA: Diagnosis not present

## 2016-12-09 DIAGNOSIS — K080 Exfoliation of teeth due to systemic causes: Secondary | ICD-10-CM | POA: Diagnosis not present

## 2017-01-25 DIAGNOSIS — K080 Exfoliation of teeth due to systemic causes: Secondary | ICD-10-CM | POA: Diagnosis not present

## 2017-02-02 DIAGNOSIS — K080 Exfoliation of teeth due to systemic causes: Secondary | ICD-10-CM | POA: Diagnosis not present

## 2017-04-27 DIAGNOSIS — K080 Exfoliation of teeth due to systemic causes: Secondary | ICD-10-CM | POA: Diagnosis not present

## 2017-05-05 ENCOUNTER — Encounter: Payer: Self-pay | Admitting: Family Medicine

## 2017-05-05 ENCOUNTER — Ambulatory Visit: Payer: Federal, State, Local not specified - PPO | Admitting: Family Medicine

## 2017-05-05 VITALS — BP 112/78 | HR 78 | Temp 98.0°F | Resp 20 | Ht 63.0 in | Wt 175.5 lb

## 2017-05-05 DIAGNOSIS — K649 Unspecified hemorrhoids: Secondary | ICD-10-CM

## 2017-05-05 NOTE — Progress Notes (Signed)
Gail Duncan , [birth year], 52 y.o., female MRN: [id number] Patient Care Team    Relationship Specialty Notifications Start End  Natalia LeatherwoodKuneff, Renee A, DO PCP - General Family Medicine  09/07/16     Chief Complaint  Patient presents with  . Hemorrhoids    discuss treatment     Subjective: Pt presents for an OV with complaints of hemorrhoid irritation of 1 week  duration.  Associated symptoms include pain and burning. 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 She has been seen in the past by GI and would like a referral (new pt area). She also would like a referral on her dilt 2% cream if possible. She reports that is what is helpful. She reports she has not had her BM today. She has seen small amount of bright red blood on toilet tissue. She denies palpitations, dizziness or fatigue.    Depression screen PHQ 2/9 05/05/2017  Decreased Interest 0  Down, Depressed, Hopeless 0  PHQ - 2 Score 0    Allergies  Allergen Reactions  . Asa [Aspirin] Other (See Comments)    Contraindicated due to medical condition   Social History   Tobacco Use  . Smoking status: Never Smoker  . Smokeless tobacco: Never Used  Substance Use Topics  . Alcohol use: Yes    Alcohol/week: 0.0 oz    Comment: occasional social use   Past Medical History:  Diagnosis Date  . Anemia   . Hearing loss in right ear    Unknown cause, had full work up with Specialist in [place]  . History of chicken pox    Past Surgical History:  Procedure Laterality Date  . ABDOMINAL EXPLORATION SURGERY    . ABLATION    . DENTAL SURGERY    . EXPLORATORY LAPAROTOMY     "fibrous"   Family History  Problem Relation Age of Onset  . Diabetes Mother   . Prostate cancer Father   . Hypertension Father   . Lung cancer Paternal Grandfather    Allergies as of 05/05/2017      Reactions   Asa [aspirin] Other (See Comments)   Contraindicated due to medical condition      Medication List        Accurate as of 05/05/17  8:29 AM. Always use your most recent med list.          CoQ10-Vitamin E 100-10 MG-UNIT Caps Take by mouth.   FLAXSEED OIL PO Take by mouth.   IRON 100 PLUS PO Take by mouth.   magnesium 30 MG tablet Take 30 mg by mouth 2 (two) times daily.   Vitamin D3 5000 units Caps Take 1 capsule by mouth daily.       All past medical history, surgical history, allergies, family history, immunizations andmedications were updated in the EMR today and reviewed under the history and medication portions of their EMR.     ROS: Negative, with the exception of above mentioned in HPI   Objective:  BP 112/78 (BP Location: Right Arm, Patient Position: Sitting, Cuff Size: Large)   Pulse 78   Temp 98 F (36.7 C)   Resp 20   Ht 5\' 3"  (1.6 m)   Wt 175 lb 8 oz (79.6 kg)   LMP 12/22/2014   SpO2 100%   BMI 31.09 kg/m  Body mass index is 31.09 kg/m. Gen: Afebrile. No acute distress. Nontoxic in appearance, well developed, well nourished.  HENT: AT. [place].. MMM,  no oral lesions.  Eyes:Pupils Equal Round Reactive to light, Extraocular movements intact,  Conjunctiva without redness, discharge or icterus. CV: RRR * Chest: CTAB, no wheeze or crackles.  Neuro: Normal gait. PERLA. EOMi. Alert. Oriented x3    No exam data present No results found. No results found for this or any previous visit (from the past 24 hour(s)).  Assessment/Plan: Gail Duncan is a 52 y.o. female present for OV for  Hemorrhoids, unspecified hemorrhoid type - hand written script for Dilt 2% oint cmp, 1 jar--> [facility] pharmacy recommended for compound.  - Ambulatory referral to Gastroenterology to establish for her chronic issues and she will be due for screening colonoscopy this year.   Reviewed expectations re: course of current medical issues.  Discussed self-management of symptoms.  Outlined signs and symptoms indicating need for more acute intervention.  Patient verbalized understanding  and all questions were answered.  Patient received an After-Visit Summary.    No orders of the defined types were placed in this encounter.    Note is dictated utilizing voice recognition software. Although note has been proof read prior to signing, occasional typographical errors still can be missed. If any questions arise, please do not hesitate to call for verification.   electronically signed by:  Felix Pacini, DO  [facility] [place]

## 2017-05-05 NOTE — Patient Instructions (Addendum)
[facility] will likely be able to fill your compound med.   Located in: [facility]  Address: [address], [place], [place] [address]  Phone: [phone]  Please help us help you:  We are honored [facility] have [facility]. Below you will find basic instructions that you may need to access in the future. Please help us help you by reading the instructions, which cover many of the frequent questions we experience.   Prescription refills and request:  -In order to allow more efficient response time, please call your pharmacy for all refills. They will forward the request electronically to us. This allows for the quickest possible response. Request left on a nurse line can take longer to refill, since these are checked as time allows between office patients and other phone calls.  - refill request can take up to 3-5 working days to complete.  - If request is sent electronically and request is appropiate, it is usually completed in 1-2 business days.  - all patients will need to be seen routinely for all chronic medical conditions requiring prescription medications (see follow-up below). If you are overdue for follow up on your condition, you will be asked to make an appointment and we will call in enough medication to cover you until your appointment (up to 30 days).  - all controlled substances will require a face to face visit to request/refill.  - if you desire your prescriptions to go through a new pharmacy, and have an active script at original pharmacy, you will need to call your pharmacy and have scripts transferred to new pharmacy. This is completed between the pharmacy locations and not by your provider.    Results: If any images or labs were ordered, it can take up to 1 week to get results depending on the test ordered and the lab/facility running and resulting the test. - Normal or stable results, which do not need further  discussion, may be released to your mychart immediately with attached note to you. A call may not be generated for normal results. Please make certain to sign up for mychart. If you have questions on how to activate your mychart you can call the front office.  - If your results need further discussion, our office will attempt to contact you via phone, and if unable to reach you after 2 attempts, we will release your abnormal result to your mychart with instructions.  - All results will be automatically released in mychart after 1 week.  - Your provider will provide you with explanation and instruction on all relevant material in your results. Please keep in mind, results and labs may appear confusing or abnormal to the untrained eye, but it does not mean they are actually abnormal for you personally. If you have any questions about your results that are not covered, or you desire more detailed explanation than what was provided, you should make an appointment with your provider to do so.   Our office handles many outgoing and incoming calls daily. If we have not contacted you within 1 week about your results, please check your mychart to see if there is a message first and if not, then contact our office.  In helping with this matter, you help decrease call volume, and therefore allow us to be able to respond to patients needs more efficiently.   Acute office visits (sick visit):  An acute visit is intended for a  new problem and are scheduled in shorter time slots to allow schedule openings for patients with new problems. This is the appropriate visit to discuss a new problem. In order to provide you with excellent quality medical care with proper time for you to explain your problem, have an exam and receive treatment with instructions, these appointments should be limited to one new problem per visit. If you experience a new problem, in which you desire to be addressed, please make an acute office visit,  we save openings on the schedule to accommodate you. Please do not save your new problem for any other type of visit, let us take care of it properly and quickly for you.   Follow up visits:  Depending on your condition(s) your provider will need to see you routinely in order to provide you with quality care and prescribe medication(s). Most chronic conditions (Example: hypertension, Diabetes, depression/anxiety... etc), require visits a couple times a year. Your provider will instruct you on proper follow up for your personal medical conditions and history. Please make certain to make follow up appointments for your condition as instructed. Failing to do so could result in lapse in your medication treatment/refills. If you request a refill, and are overdue to be seen on a condition, we will always provide you with a 30 day script (once) to allow you time to schedule.    Medicare wellness (well visit): - we have a wonderful Nurse Maudie Mercury), that will meet with you and provide you will yearly medicare wellness visits. These visits should occur yearly (can not be scheduled less than 1 calendar year apart) and cover preventive health, immunizations, advance directives and screenings you are entitled to yearly through your medicare benefits. Do not miss out on your entitled benefits, this is when medicare will pay for these benefits to be ordered for you.  These are strongly encouraged by your provider and is the appropriate type of visit to make certain you are up to date with all preventive health benefits. If you have not had your medicare wellness exam in the last 12 months, please make certain to schedule one by calling the office and schedule your medicare wellness with Maudie Mercury as soon as possible.   Yearly physical (well visit):  - Adults are recommended to be seen yearly for physicals. Check with your insurance and date of your last physical, most insurances require one calendar year between physicals.  Physicals include all preventive health topics, screenings, medical exam and labs that are appropriate for gender/age and history. You may have fasting labs needed at this visit. This is a well visit (not a sick visit), new problems should not be covered during this visit (see acute visit).  - Pediatric patients are seen more frequently when they are younger. Your provider will advise you on well child visit timing that is appropriate for your their age. - This is not a medicare wellness visit. Medicare wellness exams do not have an exam portion to the visit. Some medicare companies allow for a physical, some do not allow a yearly physical. If your medicare allows a yearly physical you can schedule the medicare wellness with our nurse Maudie Mercury and have your physical with your provider after, on the same day. Please check with insurance for your full benefits.   Late Policy/No Shows:  - all new patients should arrive 15-30 minutes earlier than appointment to allow [place] time  to  obtain all personal demographics,  insurance information and for you to complete  office paperwork. - All established patients should arrive 10-15 minutes earlier than appointment time to update all information and be checked in .  - In our best efforts to run on time, if you are late for your appointment you will be asked to either reschedule or if able, we will work you back into the schedule. There will be a wait time to work you back in the schedule,  depending on availability.  - If you are unable to make it to your appointment as scheduled, please call 24 hours ahead of time to allow us to fill the time slot with someone else who needs to be seen. If you do not cancel your appointment ahead of time, you may be charged a no show fee.

## 2017-05-13 ENCOUNTER — Ambulatory Visit (INDEPENDENT_AMBULATORY_CARE_PROVIDER_SITE_OTHER): Payer: Federal, State, Local not specified - PPO | Admitting: Family Medicine

## 2017-05-13 ENCOUNTER — Encounter: Payer: Self-pay | Admitting: Family Medicine

## 2017-05-13 VITALS — BP 92/68 | HR 77 | Temp 97.5°F | Ht 62.0 in | Wt 172.6 lb

## 2017-05-13 DIAGNOSIS — E611 Iron deficiency: Secondary | ICD-10-CM

## 2017-05-13 DIAGNOSIS — E785 Hyperlipidemia, unspecified: Secondary | ICD-10-CM

## 2017-05-13 DIAGNOSIS — Z1239 Encounter for other screening for malignant neoplasm of breast: Secondary | ICD-10-CM

## 2017-05-13 DIAGNOSIS — Z1231 Encounter for screening mammogram for malignant neoplasm of breast: Secondary | ICD-10-CM | POA: Diagnosis not present

## 2017-05-13 DIAGNOSIS — Z0000 Encounter for general adult medical examination without abnormal findings: Secondary | ICD-10-CM | POA: Diagnosis not present

## 2017-05-13 DIAGNOSIS — E669 Obesity, unspecified: Secondary | ICD-10-CM

## 2017-05-13 DIAGNOSIS — Z131 Encounter for screening for diabetes mellitus: Secondary | ICD-10-CM | POA: Diagnosis not present

## 2017-05-13 DIAGNOSIS — Z23 Encounter for immunization: Secondary | ICD-10-CM

## 2017-05-13 LAB — LIPID PANEL
Cholesterol: 245 mg/dL — ABNORMAL HIGH (ref 0–200)
HDL: 98.3 mg/dL (ref >39.00)
LDL CALC: 131 mg/dL — AB (ref 0–99)
NonHDL: 146.32
TRIGLYCERIDES: 78 mg/dL (ref 0.0–149.0)
Total CHOL/HDL Ratio: 2
VLDL: 15.6 mg/dL (ref 0.0–40.0)

## 2017-05-13 LAB — COMPREHENSIVE METABOLIC PANEL
ALBUMIN: 4.4 g/dL (ref 3.5–5.2)
ALK PHOS: 85 U/L (ref 39–117)
ALT: 24 U/L (ref 0–35)
AST: 20 U/L (ref 0–37)
BUN: 13 mg/dL (ref 6–23)
CALCIUM: 9.8 mg/dL (ref 8.4–10.5)
CHLORIDE: 105 meq/L (ref 96–112)
CO2: 32 mEq/L (ref 19–32)
Creatinine, Ser: 0.73 mg/dL (ref 0.40–1.20)
GFR: 89.34 mL/min (ref >60.00)
Glucose, Bld: 79 mg/dL (ref 70–99)
POTASSIUM: 4.2 meq/L (ref 3.5–5.1)
Sodium: 141 mEq/L (ref 135–145)
TOTAL PROTEIN: 7.3 g/dL (ref 6.0–8.3)
Total Bilirubin: 0.6 mg/dL (ref 0.2–1.2)

## 2017-05-13 LAB — CBC WITH DIFFERENTIAL/PLATELET
BASOS ABS: 0 10*3/uL (ref 0.0–0.1)
BASOS PCT: 0.5 % (ref 0.0–3.0)
EOS ABS: 0 10*3/uL (ref 0.0–0.7)
EOS PCT: 0.8 % (ref 0.0–5.0)
HCT: 40.3 % (ref 36.0–46.0)
HEMOGLOBIN: 13.8 g/dL (ref 12.0–15.0)
LYMPHS PCT: 31.5 % (ref 12.0–46.0)
Lymphs Abs: 1.5 10*3/uL (ref 0.7–4.0)
MCHC: 34.1 g/dL (ref 30.0–36.0)
MCV: 87.3 fl (ref 78.0–100.0)
MONOS PCT: 5.3 % (ref 3.0–12.0)
Monocytes Absolute: 0.3 10*3/uL (ref 0.1–1.0)
NEUTROS ABS: 3 10*3/uL (ref 1.4–7.7)
NEUTROS PCT: 61.9 % (ref 43.0–77.0)
Platelets: 237 10*3/uL (ref 150.0–400.0)
RBC: 4.62 Mil/uL (ref 3.87–5.11)
RDW: 13.2 % (ref 11.5–15.5)
WBC: 4.8 10*3/uL (ref 4.0–10.5)

## 2017-05-13 LAB — HEMOGLOBIN A1C: HEMOGLOBIN A1C: 4.8 % (ref 4.6–6.5)

## 2017-05-13 LAB — T4, FREE: Free T4: 0.93 ng/dL (ref 0.60–1.60)

## 2017-05-13 LAB — TSH: TSH: 1.44 u[IU]/mL (ref 0.35–4.50)

## 2017-05-13 NOTE — Progress Notes (Signed)
Patient ID: Arleny Kruger, female  DOB: 1965/11/10, 52 y.o.   MRN: [id number] Patient Care Team    Relationship Specialty Notifications Start End  Natalia Leatherwood, DO PCP - General Family Medicine  09/07/16   Zelphia Cairo, MD Consulting Physician Obstetrics and Gynecology  05/13/17     Chief Complaint  Patient presents with  . Annual Exam    CPE    Subjective:  Naelle Diegel is a 52 y.o.  Female  present for CPE. All past medical history, surgical history, allergies, family history, immunizations, medications and social history were updated in the electronic medical record today. All recent labs, ED visits and hospitalizations within the last year were reviewed.  Health maintenance:  Colonoscopy: 2012, screened for bleeding and internal hemorrhoids. Next due at age 45 she has scheduled for next month to meet new GI at [facility].  Mammogram: 2016,  Indicated, a family history, no personal history. Order placed today. Cervical cancer screening:  patient states she has had history of abnormal Paps. She has had at least 2 normal Paps consecutively. She states she had a PAP 2.5 years ago and is due this year (Dr. Renaldo Fiddler) Immunizations: Tdap due, completed today. Declined flu shot. Shingrix will be offered next year if she has not received by pharmacy then.  Infectious disease screening: HIV completed with pregnancy.  Oxygen WUJ:WJXB Patient has a Dental home. Hospitalizations/ED visits: None Assistive device: None   Depression screen [facility] 2/9 05/13/2017 05/05/2017  Decreased Interest 0 0  Down, Depressed, Hopeless 0 0  PHQ - 2 Score 0 0   No flowsheet data found.   Current Exercise Habits: Home exercise routine, Type of exercise: walking, Time (Minutes): 30, Frequency (Times/Week): 2, Weekly Exercise (Minutes/Week): 60, Intensity: Mild   There is no immunization history for the selected administration types on file for this patient.   Past Medical History:  Diagnosis Date  .  Anemia   . Hearing loss in right ear    Unknown cause, had full work up with Specialist in [place]  . History of chicken pox    Allergies  Allergen Reactions  . Asa [Aspirin] Other (See Comments)    Contraindicated due to medical condition   Past Surgical History:  Procedure Laterality Date  . ABDOMINAL EXPLORATION SURGERY    . ABLATION    . DENTAL SURGERY    . EXPLORATORY LAPAROTOMY     "fibrous"   Family History  Problem Relation Age of Onset  . Diabetes Mother   . Prostate cancer Father   . Hypertension Father   . Lung cancer Paternal Grandfather    Social History   Socioeconomic History  . Marital status: Married    Spouse name: Not on file  . Number of children: Not on file  . Years of education: Not on file  . Highest education level: Not on file  Social Needs  . Financial resource strain: Not on file  . Food insecurity - worry: Not on file  . Food insecurity - inability: Not on file  . Transportation needs - medical: Not on file  . Transportation needs - non-medical: Not on file  Occupational History  . Not on file  Tobacco Use  . Smoking status: Never Smoker  . Smokeless tobacco: Never Used  Substance and Sexual Activity  . Alcohol use: Yes    Alcohol/week: 0.0 oz    Comment: occasional social use  . Drug use: No  . Sexual activity: Yes  Other  Topics Concern  . 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 Bachelor's degree    Moved to [place], from [place] in July 2016.    EtOH use occasional,, nonsmoker, denies recreational drugs.   Drinks caffeinated beverages, uses herbal remedies, takes daily vitamins   Patient wears her seatbelt, wears a bike,, exercises at least 3 times a week. There is a smoke alarm in her home.   Allergies as of 05/13/2017      Reactions   Asa [aspirin] Other (See Comments)   Contraindicated due to medical condition      Medication List        Accurate as of 05/13/17 11:59 PM. Always  use your most recent med list.          CoQ10-Vitamin E 100-10 MG-UNIT Caps Take by mouth.   FLAXSEED OIL PO Take by mouth.   IRON 100 PLUS PO Take by mouth.   magnesium 30 MG tablet Take 30 mg by mouth 2 (two) times daily.   Vitamin D3 5000 units Caps Take 1 capsule by mouth daily.       All past medical history, surgical history, allergies, family history, immunizations andmedications were updated in the EMR today and reviewed under the history and medication portions of their EMR.     Recent Results (from the past 2160 hour(s))  CBC with Differential/Platelet     Status: None   Collection Time: 05/13/17  8:53 AM  Result Value Ref Range   WBC 4.8 4.0 - 10.5 K/uL   RBC 4.62 3.87 - 5.11 Mil/uL   Hemoglobin 13.8 12.0 - 15.0 g/dL   HCT 60.4 54.0 - 98.1 %   MCV 87.3 78.0 - 100.0 fl   MCHC 34.1 30.0 - 36.0 g/dL   RDW 19.1 47.8 - 29.5 %   Platelets 237.0 150.0 - 400.0 K/uL   Neutrophils Relative % 61.9 43.0 - 77.0 %   Lymphocytes Relative 31.5 12.0 - 46.0 %   Monocytes Relative 5.3 3.0 - 12.0 %   Eosinophils Relative 0.8 0.0 - 5.0 %   Basophils Relative 0.5 0.0 - 3.0 %   Neutro Abs 3.0 1.4 - 7.7 K/uL   Lymphs Abs 1.5 0.7 - 4.0 K/uL   Monocytes Absolute 0.3 0.1 - 1.0 K/uL   Eosinophils Absolute 0.0 0.0 - 0.7 K/uL   Basophils Absolute 0.0 0.0 - 0.1 K/uL  Comprehensive metabolic panel     Status: None   Collection Time: 05/13/17  8:53 AM  Result Value Ref Range   Sodium 141 135 - 145 mEq/L   Potassium 4.2 3.5 - 5.1 mEq/L   Chloride 105 96 - 112 mEq/L   CO2 32 19 - 32 mEq/L   Glucose, Bld 79 70 - 99 mg/dL   BUN 13 6 - 23 mg/dL   Creatinine, Ser 6.21 0.40 - 1.20 mg/dL   Total Bilirubin 0.6 0.2 - 1.2 mg/dL   Alkaline Phosphatase 85 39 - 117 U/L   AST 20 0 - 37 U/L   ALT 24 0 - 35 U/L   Total Protein 7.3 6.0 - 8.3 g/dL   Albumin 4.4 3.5 - 5.2 g/dL   Calcium 9.8 8.4 - 30.8 mg/dL   GFR 65.78 >46.96 mL/min  Lipid panel     Status: Abnormal   Collection Time:  05/13/17  8:53 AM  Result Value Ref Range   Cholesterol 245 (H) 0 - 200 mg/dL    Comment: ATP III  Classification       Desirable:  < 200 mg/dL               Borderline High:  200 - 239 mg/dL          High:  > = 161240 mg/dL   Triglycerides 09.678.0 0.0 - 149.0 mg/dL    Comment: Normal:  <045<150 mg/dLBorderline High:  150 - 199 mg/dL   HDL 40.9898.30 >11.91>39.00 mg/dL   VLDL 47.815.6 0.0 - 29.540.0 mg/dL   LDL Cholesterol 621131 (H) 0 - 99 mg/dL   Total CHOL/HDL Ratio 2     Comment:                Men          Women1/2 Average Risk     3.4          3.3Average Risk          5.0          4.42X Average Risk          9.6          7.13X Average Risk          15.0          11.0                       NonHDL 146.32     Comment: NOTE:  Non-HDL goal should be 30 mg/dL higher than patient's LDL goal (i.e. LDL goal of < 70 mg/dL, would have non-HDL goal of < 100 mg/dL)  Hemoglobin H0QA1c     Status: None   Collection Time: 05/13/17  8:53 AM  Result Value Ref Range   Hgb A1c MFr Bld 4.8 4.6 - 6.5 %    Comment: Glycemic Control Guidelines for People with Diabetes:Non Diabetic:  <6%Goal of Therapy: <7%Additional Action Suggested:  >8%   TSH     Status: None   Collection Time: 05/13/17  8:53 AM  Result Value Ref Range   TSH 1.44 0.35 - 4.50 uIU/mL  T4, free     Status: None   Collection Time: 05/13/17  8:53 AM  Result Value Ref Range   Free T4 0.93 0.60 - 1.60 ng/dL    Comment: Specimens from patients who are undergoing biotin therapy and /or ingesting biotin supplements may contain high levels of biotin.  The higher biotin concentration in these specimens interferes with this Free T4 assay.  Specimens that contain high levels  of biotin may cause false high results for this Free T4 assay.  Please interpret results in light of the total clinical presentation of the patient.    Iron, TIBC and Ferritin Panel     Status: None (In process)   Collection Time: 05/13/17  8:53 AM  Result Value Ref Range   Iron 102 45 - 160 mcg/dL   TIBC 657321  846250 - 962450 mcg/dL (calc)   %SAT 32 11 - 50 % (calc)     ROS: 14 pt review of systems performed and negative (unless mentioned in an HPI)  Objective: BP 92/68 (BP Location: Left Arm, Patient Position: Sitting, Cuff Size: Large)   Pulse 77   Temp (!) 97.5 F (36.4 C) (Oral)   Ht 5\' 2"  (1.575 m)   Wt 172 lb 9.6 oz (78.3 kg)   LMP 12/22/2014   SpO2 100%   BMI 31.57 kg/m  Gen: Afebrile. No acute distress. Nontoxic in appearance, well-developed, well-nourished,  Pleasant Caucasian female.  HENT: AT. [place]. Bilateral TM visualized and normal in appearance, normal external auditory canal. MMM, no oral lesions, adequate dentition. Bilateral nares within normal limits. Throat without erythema, ulcerations or exudates. no Cough on exam, no hoarseness on exam. Eyes:Pupils Equal Round Reactive to light, Extraocular movements intact,  Conjunctiva without redness, discharge or icterus. Neck/lymp/endocrine: Supple,no lymphadenopathy, no thyromegaly CV: RRR no murmur, no edema, +2/4 P posterior tibialis pulses. no carotid bruits. No JVD. Chest: CTAB, no wheeze, rhonchi or crackles. normal Respiratory effort. good Air movement. Abd: Soft. round. NTND. BS present. no Masses palpated. No hepatosplenomegaly. No rebound tenderness or guarding. Skin: no rashes, purpura or petechiae. Warm and well-perfused. Skin intact. Neuro/Msk:  Normal gait. PERLA. EOMi. Alert. Oriented x3.  Cranial nerves II through XII intact. Muscle strength 5/5 upper/lower extremity. DTRs equal bilaterally. Psych: Normal affect, dress and demeanor. Normal speech. Normal thought content and judgment.   No exam data present  Assessment/plan: Caylei Sperry is a 52 y.o. female present for CPE. Iron deficiency - CBC with Differential/Platelet - Iron, TIBC and Ferritin Panel Hyperlipidemia, unspecified hyperlipidemia type/obesity - diet and exercise modifications encouraged - Comprehensive metabolic panel - Lipid panel - TSH - T4,  free Diabetes mellitus screening - Hemoglobin A1c Breast cancer screening - MM DIGITAL SCREENING BILATERAL; Future Encounter for preventive health examination Patient was encouraged to exercise greater than 150 minutes a week. Patient was encouraged to choose a diet filled with fresh fruits and vegetables, and lean meats. AVS provided to patient today for education/recommendation on gender specific health and safety maintenance. Colonoscopy: 2012, screened for bleeding and internal hemorrhoids. Next due at age 33 she has scheduled for next month to meet new GI at [facility]  Mammogram: 2016,  Indicated, a family history, no personal history. Order placed today. Cervical cancer screening:  patient states she has had history of abnormal Paps. She has had at least 2 normal Paps consecutively. She states she had a PAP 2.5 years ago and is due this year (Dr. Renaldo Fiddler) Immunizations: Tdap due, completed today. Declined flu shot. Shingrix will be offered next year if she has not received by pharmacy then.  Infectious disease screening: HIV likely completed with pregnancy, will offer next year if it has not been completed by GYN     Return in about 1 year (around 05/14/2018) for CPE.  Electronically signed by: Felix Pacini, DO [facility]

## 2017-05-13 NOTE — Patient Instructions (Signed)
Health Maintenance, Female Adopting a healthy lifestyle and getting preventive care can go a long way to promote health and wellness. Talk with your health care provider about what schedule of regular examinations is right for you. This is a good chance for you to check in with your provider about disease prevention and staying healthy. In between checkups, there are plenty of things you can do on your own. Experts have done a lot of research about which lifestyle changes and preventive measures are most likely to keep you healthy. Ask your health care provider for more information. Weight and diet Eat a healthy diet  Be sure to include plenty of vegetables, fruits, low-fat dairy products, and lean protein.  Do not eat a lot of foods high in solid fats, added sugars, or salt.  Get regular exercise. This is one of the most important things you can do for your health. ? Most adults should exercise for at least 150 minutes each week. The exercise should increase your heart rate and make you sweat (moderate-intensity exercise). ? Most adults should also do strengthening exercises at least twice a week. This is in addition to the moderate-intensity exercise.  Maintain a healthy weight  Body mass index (BMI) is a measurement that can be used to identify possible weight problems. It estimates body fat based on height and weight. Your health care provider can help determine your BMI and help you achieve or maintain a healthy weight.  For females 27 years of age and older: ? A BMI below 18.5 is considered underweight. ? A BMI of 18.5 to 24.9 is normal. ? A BMI of 25 to 29.9 is considered overweight. ? A BMI of 30 and above is considered obese.  Watch levels of cholesterol and blood lipids  You should start having your blood tested for lipids and cholesterol at 51 years of age, then have this test every 5 years.  You may need to have your cholesterol levels checked more often if: ? Your lipid or  cholesterol levels are high. ? You are older than 51 years of age. ? You are at high risk for heart disease.  Cancer screening Lung Cancer  Lung cancer screening is recommended for adults 86-88 years old who are at high risk for lung cancer because of a history of smoking.  A yearly low-dose CT scan of the lungs is recommended for people who: ? Currently smoke. ? Have quit within the past 15 years. ? Have at least a 30-pack-year history of smoking. A pack year is smoking an average of one pack of cigarettes a day for 1 year.  Yearly screening should continue until it has been 15 years since you quit.  Yearly screening should stop if you develop a health problem that would prevent you from having lung cancer treatment.  Breast Cancer  Practice breast self-awareness. This means understanding how your breasts normally appear and feel.  It also means doing regular breast self-exams. Let your health care provider know about any changes, no matter how small.  If you are in your 20s or 30s, you should have a clinical breast exam (CBE) by a health care provider every 1-3 years as part of a regular health exam.  If you are 15 or older, have a CBE every year. Also consider having a breast X-ray (mammogram) every year.  If you have a family history of breast cancer, talk to your health care provider about genetic screening.  If you are at high risk  for breast cancer, talk to your health care provider about having an MRI and a mammogram every year.  Breast cancer gene (BRCA) assessment is recommended for women who have family members with BRCA-related cancers. BRCA-related cancers include: ? Breast. ? Ovarian. ? Tubal. ? Peritoneal cancers.  Results of the assessment will determine the need for genetic counseling and BRCA1 and BRCA2 testing.  Cervical Cancer Your health care provider may recommend that you be screened regularly for cancer of the pelvic organs (ovaries, uterus, and  vagina). This screening involves a pelvic examination, including checking for microscopic changes to the surface of your cervix (Pap test). You may be encouraged to have this screening done every 3 years, beginning at age 22.  For women ages 56-65, health care providers may recommend pelvic exams and Pap testing every 3 years, or they may recommend the Pap and pelvic exam, combined with testing for human papilloma virus (HPV), every 5 years. Some types of HPV increase your risk of cervical cancer. Testing for HPV may also be done on women of any age with unclear Pap test results.  Other health care providers may not recommend any screening for nonpregnant women who are considered low risk for pelvic cancer and who do not have symptoms. Ask your health care provider if a screening pelvic exam is right for you.  If you have had past treatment for cervical cancer or a condition that could lead to cancer, you need Pap tests and screening for cancer for at least 20 years after your treatment. If Pap tests have been discontinued, your risk factors (such as having a new sexual partner) need to be reassessed to determine if screening should resume. Some women have medical problems that increase the chance of getting cervical cancer. In these cases, your health care provider may recommend more frequent screening and Pap tests.  Colorectal Cancer  This type of cancer can be detected and often prevented.  Routine colorectal cancer screening usually begins at 51 years of age and continues through 51 years of age.  Your health care provider may recommend screening at an earlier age if you have risk factors for colon cancer.  Your health care provider may also recommend using home test kits to check for hidden blood in the stool.  A small camera at the end of a tube can be used to examine your colon directly (sigmoidoscopy or colonoscopy). This is done to check for the earliest forms of colorectal  cancer.  Routine screening usually begins at age 33.  Direct examination of the colon should be repeated every 5-10 years through 51 years of age. However, you may need to be screened more often if early forms of precancerous polyps or small growths are found.  Skin Cancer  Check your skin from head to toe regularly.  Tell your health care provider about any new moles or changes in moles, especially if there is a change in a mole's shape or color.  Also tell your health care provider if you have a mole that is larger than the size of a pencil eraser.  Always use sunscreen. Apply sunscreen liberally and repeatedly throughout the day.  Protect yourself by wearing long sleeves, pants, a wide-brimmed hat, and sunglasses whenever you are outside.  Heart disease, diabetes, and high blood pressure  High blood pressure causes heart disease and increases the risk of stroke. High blood pressure is more likely to develop in: ? People who have blood pressure in the high end of  the normal range (130-139/85-89 mm Hg). ? People who are overweight or obese. ? People who are African American.  If you are 21-29 years of age, have your blood pressure checked every 3-5 years. If you are 3 years of age or older, have your blood pressure checked every year. You should have your blood pressure measured twice-once when you are at a hospital or clinic, and once when you are not at a hospital or clinic. Record the average of the two measurements. To check your blood pressure when you are not at a hospital or clinic, you can use: ? An automated blood pressure machine at a pharmacy. ? A home blood pressure monitor.  If you are between 17 years and 37 years old, ask your health care provider if you should take aspirin to prevent strokes.  Have regular diabetes screenings. This involves taking a blood sample to check your fasting blood sugar level. ? If you are at a normal weight and have a low risk for diabetes,  have this test once every three years after 51 years of age. ? If you are overweight and have a high risk for diabetes, consider being tested at a younger age or more often. Preventing infection Hepatitis B  If you have a higher risk for hepatitis B, you should be screened for this virus. You are considered at high risk for hepatitis B if: ? You were born in a country where hepatitis B is common. Ask your health care provider which countries are considered high risk. ? Your parents were born in a high-risk country, and you have not been immunized against hepatitis B (hepatitis B vaccine). ? You have HIV or AIDS. ? You use needles to inject street drugs. ? You live with someone who has hepatitis B. ? You have had sex with someone who has hepatitis B. ? You get hemodialysis treatment. ? You take certain medicines for conditions, including cancer, organ transplantation, and autoimmune conditions.  Hepatitis C  Blood testing is recommended for: ? Everyone born from [birth year] through 1964. ? Anyone with known risk factors for hepatitis C.  Sexually transmitted infections (STIs)  You should be screened for sexually transmitted infections (STIs) including gonorrhea and chlamydia if: ? You are sexually active and are younger than 51 years of age. ? You are older than 51 years of age and your health care provider tells you that you are at risk for this type of infection. ? Your sexual activity has changed since you were last screened and you are at an increased risk for chlamydia or gonorrhea. Ask your health care provider if you are at risk.  If you do not have HIV, but are at risk, it may be recommended that you take a prescription medicine daily to prevent HIV infection. This is called pre-exposure prophylaxis (PrEP). You are considered at risk if: ? You are sexually active and do not regularly use condoms or know the HIV status of your partner(s). ? You take drugs by injection. ? You are  sexually active with a partner who has HIV.  Talk with your health care provider about whether you are at high risk of being infected with HIV. If you choose to begin PrEP, you should first be tested for HIV. You should then be tested every 3 months for as long as you are taking PrEP. Pregnancy  If you are premenopausal and you may become pregnant, ask your health care provider about preconception counseling.  If you may become  pregnant, take 400 to 800 micrograms (mcg) of folic acid every day.  If you want to prevent pregnancy, talk to your health care provider about birth control (contraception). Osteoporosis and menopause  Osteoporosis is a disease in which the bones lose minerals and strength with aging. This can result in serious bone fractures. Your risk for osteoporosis can be identified using a bone density scan.  If you are 62 years of age or older, or if you are at risk for osteoporosis and fractures, ask your health care provider if you should be screened.  Ask your health care provider whether you should take a calcium or vitamin D supplement to lower your risk for osteoporosis.  Menopause may have certain physical symptoms and risks.  Hormone replacement therapy may reduce some of these symptoms and risks. Talk to your health care provider about whether hormone replacement therapy is right for you. Follow these instructions at home:  Schedule regular health, dental, and eye exams.  Stay current with your immunizations.  Do not use any tobacco products including cigarettes, chewing tobacco, or electronic cigarettes.  If you are pregnant, do not drink alcohol.  If you are breastfeeding, limit how much and how often you drink alcohol.  Limit alcohol intake to no more than 1 drink per day for nonpregnant women. One drink equals 12 ounces of beer, 5 ounces of wine, or 1 ounces of hard liquor.  Do not use street drugs.  Do not share needles.  Ask your health care  provider for help if you need support or information about quitting drugs.  Tell your health care provider if you often feel depressed.  Tell your health care provider if you have ever been abused or do not feel safe at home. This information is not intended to replace advice given to you by your health care provider. Make sure you discuss any questions you have with your health care provider. Document Released: 09/01/2010 Document Revised: 07/25/2015 Document Reviewed: 11/20/2014 Elsevier Interactive Patient Education  2018 [employer].   Halitosis Halitosis is bad breath. Halitosis may be caused by:  Foods and drinks that you ingest.  Poor oral hygiene.  Medical conditions, such as sinus infections, mouth infections, and diabetes.  Medicines that dry out your mouth.  Smoking.  Follow these instructions at home:  Practice good oral hygiene. Do this by: ? Flossing every day. Ask your dentist to show you the best way to floss. ? Brushing your teeth at least two times each day using toothpaste that is recommended by your dentist. Ask your dentist to show you the best way to brush your teeth. ? Brushing your tongue when you brush your teeth. This may help to improve your breath. ? Rinsing your mouth one time each day using mouthwash that is recommended by your dentist. ? Scheduling and attending regular dental appointments.  Drink enough water to keep your urine clear or pale yellow.  Eat foods that help to keep your teeth clean, such as carrots and celery.  Avoid foods and drinks that can lead to bad breath, such as: ? Garlic. ? Onions. ? Fish. ? Coffee. ? Alcohol. ? Horseradish. ? Red meat.  Do not use any tobacco products, including cigarettes, chewing tobacco, or electronic cigarettes. If you need help quitting, ask your health care provider.  Make sure that any mouth devices that you wear, such as a retainer or dentures, are worn and cleaned properly.  If you have  a dry mouth, try chewing gum  or mints that do not contain sugar. Contact a health care provider if:  You have new symptoms.  Your symptoms get worse or they do not improve with home care. This information is not intended to replace advice given to you by your health care provider. Make sure you discuss any questions you have with your health care provider. Document Released: 03/26/2004 Document Revised: 07/25/2015 Document Reviewed: 02/12/2014 Elsevier Interactive Patient Education  [employer].

## 2017-05-14 ENCOUNTER — Telehealth: Payer: Self-pay | Admitting: Family Medicine

## 2017-05-14 ENCOUNTER — Encounter: Payer: Self-pay | Admitting: Family Medicine

## 2017-05-14 LAB — IRON,TIBC AND FERRITIN PANEL
%SAT: 32 % (ref 11–50)
Ferritin: 131 ng/mL (ref 10–232)
IRON: 102 ug/dL (ref 45–160)
TIBC: 321 mcg/dL (calc) (ref 250–450)

## 2017-05-14 NOTE — Telephone Encounter (Signed)
Left detailed message with results  on patient voice mail per DPR 

## 2017-05-14 NOTE — Telephone Encounter (Signed)
Please inform patient the following information: Her labs look good. Thyroid and diabetes screen also normal. Iron is a good.  Her cholesterol looks higher, but that is because her good cholesterol (HDL) is so high, which is good.

## 2017-05-24 ENCOUNTER — Other Ambulatory Visit: Payer: Self-pay | Admitting: Family Medicine

## 2017-05-24 DIAGNOSIS — Z1231 Encounter for screening mammogram for malignant neoplasm of breast: Secondary | ICD-10-CM

## 2017-06-09 DIAGNOSIS — R1310 Dysphagia, unspecified: Secondary | ICD-10-CM | POA: Diagnosis not present

## 2017-06-09 DIAGNOSIS — R196 Halitosis: Secondary | ICD-10-CM | POA: Diagnosis not present

## 2017-06-09 DIAGNOSIS — K625 Hemorrhage of anus and rectum: Secondary | ICD-10-CM | POA: Diagnosis not present

## 2017-06-09 DIAGNOSIS — K601 Chronic anal fissure: Secondary | ICD-10-CM | POA: Diagnosis not present

## 2017-06-11 ENCOUNTER — Other Ambulatory Visit: Payer: Self-pay | Admitting: Gastroenterology

## 2017-06-11 ENCOUNTER — Ambulatory Visit
Admission: RE | Admit: 2017-06-11 | Discharge: 2017-06-11 | Disposition: A | Discharge status: Home / Self Care | Payer: Federal, State, Local not specified - PPO | Source: Ambulatory Visit | Attending: Family Medicine | Admitting: Family Medicine

## 2017-06-11 DIAGNOSIS — R196 Halitosis: Secondary | ICD-10-CM

## 2017-06-11 DIAGNOSIS — Z1231 Encounter for screening mammogram for malignant neoplasm of breast: Secondary | ICD-10-CM

## 2017-06-14 ENCOUNTER — Ambulatory Visit
Admission: RE | Admit: 2017-06-14 | Discharge: 2017-06-14 | Disposition: A | Discharge status: Home / Self Care | Payer: Federal, State, Local not specified - PPO | Source: Ambulatory Visit | Attending: Gastroenterology | Admitting: Gastroenterology

## 2017-06-14 DIAGNOSIS — R196 Halitosis: Secondary | ICD-10-CM

## 2017-06-14 DIAGNOSIS — K449 Diaphragmatic hernia without obstruction or gangrene: Secondary | ICD-10-CM | POA: Diagnosis not present

## 2017-06-30 DIAGNOSIS — K625 Hemorrhage of anus and rectum: Secondary | ICD-10-CM | POA: Diagnosis not present

## 2017-06-30 DIAGNOSIS — B9681 Helicobacter pylori [H. pylori] as the cause of diseases classified elsewhere: Secondary | ICD-10-CM | POA: Diagnosis not present

## 2017-06-30 DIAGNOSIS — R1310 Dysphagia, unspecified: Secondary | ICD-10-CM | POA: Diagnosis not present

## 2017-06-30 DIAGNOSIS — K2930 Chronic superficial gastritis without bleeding: Secondary | ICD-10-CM | POA: Diagnosis not present

## 2017-06-30 DIAGNOSIS — K449 Diaphragmatic hernia without obstruction or gangrene: Secondary | ICD-10-CM | POA: Diagnosis not present

## 2017-06-30 LAB — HM COLONOSCOPY

## 2017-07-07 DIAGNOSIS — B9681 Helicobacter pylori [H. pylori] as the cause of diseases classified elsewhere: Secondary | ICD-10-CM | POA: Diagnosis not present

## 2017-07-07 DIAGNOSIS — K2930 Chronic superficial gastritis without bleeding: Secondary | ICD-10-CM | POA: Diagnosis not present

## 2017-07-20 DIAGNOSIS — K649 Unspecified hemorrhoids: Secondary | ICD-10-CM | POA: Diagnosis not present

## 2017-07-20 DIAGNOSIS — A048 Other specified bacterial intestinal infections: Secondary | ICD-10-CM | POA: Diagnosis not present

## 2017-07-20 DIAGNOSIS — K209 Esophagitis, unspecified: Secondary | ICD-10-CM | POA: Diagnosis not present

## 2017-08-09 ENCOUNTER — Ambulatory Visit (INDEPENDENT_AMBULATORY_CARE_PROVIDER_SITE_OTHER): Payer: Federal, State, Local not specified - PPO | Admitting: Ophthalmology

## 2017-08-09 ENCOUNTER — Encounter (INDEPENDENT_AMBULATORY_CARE_PROVIDER_SITE_OTHER): Payer: Self-pay | Admitting: Ophthalmology

## 2017-08-09 ENCOUNTER — Encounter (HOSPITAL_COMMUNITY)
Admission: RE | Disposition: A | Discharge status: Home / Self Care | Payer: Self-pay | Source: Ambulatory Visit | Attending: Ophthalmology

## 2017-08-09 ENCOUNTER — Ambulatory Visit (HOSPITAL_COMMUNITY): Payer: Federal, State, Local not specified - PPO | Admitting: Critical Care Medicine

## 2017-08-09 ENCOUNTER — Encounter (HOSPITAL_COMMUNITY): Payer: Self-pay | Admitting: Emergency Medicine

## 2017-08-09 ENCOUNTER — Emergency Department (HOSPITAL_COMMUNITY): Payer: Federal, State, Local not specified - PPO

## 2017-08-09 ENCOUNTER — Ambulatory Visit (HOSPITAL_COMMUNITY)
Admission: RE | Admit: 2017-08-09 | Discharge: 2017-08-09 | Disposition: A | Discharge status: Home / Self Care | Payer: Federal, State, Local not specified - PPO | Source: Ambulatory Visit | Attending: Ophthalmology | Admitting: Ophthalmology

## 2017-08-09 ENCOUNTER — Emergency Department (HOSPITAL_COMMUNITY)
Admission: EM | Admit: 2017-08-09 | Discharge: 2017-08-09 | Disposition: A | Discharge status: Home / Self Care | Payer: Federal, State, Local not specified - PPO | Source: Home / Self Care | Attending: Emergency Medicine | Admitting: Emergency Medicine

## 2017-08-09 ENCOUNTER — Encounter (HOSPITAL_COMMUNITY): Payer: Self-pay | Admitting: *Deleted

## 2017-08-09 ENCOUNTER — Other Ambulatory Visit: Payer: Self-pay

## 2017-08-09 DIAGNOSIS — H5711 Ocular pain, right eye: Secondary | ICD-10-CM | POA: Insufficient documentation

## 2017-08-09 DIAGNOSIS — H43813 Vitreous degeneration, bilateral: Secondary | ICD-10-CM

## 2017-08-09 DIAGNOSIS — Z79899 Other long term (current) drug therapy: Secondary | ICD-10-CM | POA: Insufficient documentation

## 2017-08-09 DIAGNOSIS — H35412 Lattice degeneration of retina, left eye: Secondary | ICD-10-CM | POA: Diagnosis not present

## 2017-08-09 DIAGNOSIS — H53451 Other localized visual field defect, right eye: Secondary | ICD-10-CM | POA: Diagnosis not present

## 2017-08-09 DIAGNOSIS — H3321 Serous retinal detachment, right eye: Secondary | ICD-10-CM | POA: Diagnosis not present

## 2017-08-09 DIAGNOSIS — H33011 Retinal detachment with single break, right eye: Secondary | ICD-10-CM | POA: Insufficient documentation

## 2017-08-09 DIAGNOSIS — H33332 Multiple defects of retina without detachment, left eye: Secondary | ICD-10-CM | POA: Diagnosis not present

## 2017-08-09 DIAGNOSIS — H35413 Lattice degeneration of retina, bilateral: Secondary | ICD-10-CM | POA: Diagnosis not present

## 2017-08-09 DIAGNOSIS — H25813 Combined forms of age-related cataract, bilateral: Secondary | ICD-10-CM

## 2017-08-09 DIAGNOSIS — H5340 Unspecified visual field defects: Secondary | ICD-10-CM | POA: Insufficient documentation

## 2017-08-09 DIAGNOSIS — H33021 Retinal detachment with multiple breaks, right eye: Secondary | ICD-10-CM | POA: Diagnosis not present

## 2017-08-09 DIAGNOSIS — R29818 Other symptoms and signs involving the nervous system: Secondary | ICD-10-CM | POA: Diagnosis not present

## 2017-08-09 DIAGNOSIS — H3581 Retinal edema: Secondary | ICD-10-CM

## 2017-08-09 DIAGNOSIS — H338 Other retinal detachments: Secondary | ICD-10-CM | POA: Diagnosis not present

## 2017-08-09 DIAGNOSIS — H539 Unspecified visual disturbance: Secondary | ICD-10-CM | POA: Diagnosis not present

## 2017-08-09 DIAGNOSIS — H33302 Unspecified retinal break, left eye: Secondary | ICD-10-CM | POA: Diagnosis not present

## 2017-08-09 HISTORY — DX: Gastro-esophageal reflux disease without esophagitis: K21.9

## 2017-08-09 HISTORY — PX: LASER PHOTO ABLATION: SHX5942

## 2017-08-09 HISTORY — PX: SCLERAL BUCKLE WITH POSSIBLE 25 GAUGE PARS PLANA VITRECTOMY: SHX6206

## 2017-08-09 HISTORY — PX: EYE SURGERY: SHX253

## 2017-08-09 HISTORY — PX: GAS INSERTION: SHX5336

## 2017-08-09 HISTORY — PX: RETINAL DETACHMENT SURGERY: SHX105

## 2017-08-09 LAB — CBC WITH DIFFERENTIAL/PLATELET
Basophils Absolute: 0 10*3/uL (ref 0.0–0.1)
Basophils Relative: 0 %
Eosinophils Absolute: 0 10*3/uL (ref 0.0–0.7)
Eosinophils Relative: 0 %
HEMATOCRIT: 39.3 % (ref 36.0–46.0)
HEMOGLOBIN: 13.1 g/dL (ref 12.0–15.0)
LYMPHS ABS: 1.2 10*3/uL (ref 0.7–4.0)
LYMPHS PCT: 21 %
MCH: 29.6 pg (ref 26.0–34.0)
MCHC: 33.3 g/dL (ref 30.0–36.0)
MCV: 88.7 fL (ref 78.0–100.0)
MONOS PCT: 3 %
Monocytes Absolute: 0.2 10*3/uL (ref 0.1–1.0)
NEUTROS PCT: 76 %
Neutro Abs: 4.1 10*3/uL (ref 1.7–7.7)
Platelets: 228 10*3/uL (ref 150–400)
RBC: 4.43 MIL/uL (ref 3.87–5.11)
RDW: 12.7 % (ref 11.5–15.5)
WBC: 5.5 10*3/uL (ref 4.0–10.5)

## 2017-08-09 LAB — BASIC METABOLIC PANEL
Anion gap: 8 (ref 5–15)
BUN: 14 mg/dL (ref 6–20)
CHLORIDE: 108 mmol/L (ref 101–111)
CO2: 28 mmol/L (ref 22–32)
Calcium: 9.6 mg/dL (ref 8.9–10.3)
Creatinine, Ser: 0.78 mg/dL (ref 0.44–1.00)
GFR calc Af Amer: >60 mL/min (ref >60)
GFR calc non Af Amer: >60 mL/min (ref >60)
GLUCOSE: 103 mg/dL — AB (ref 65–99)
Potassium: 4.2 mmol/L (ref 3.5–5.1)
Sodium: 144 mmol/L (ref 135–145)

## 2017-08-09 LAB — PROTIME-INR
INR: 0.92
PROTHROMBIN TIME: 12.3 s (ref 11.4–15.2)

## 2017-08-09 SURGERY — SCLERAL BUCKLE WITH POSSIBLE 25 GAUGE PARS PLANA VITRECTOMY
Anesthesia: General | Site: Eye | Laterality: Right

## 2017-08-09 MED ORDER — HYDROCODONE-ACETAMINOPHEN 5-325 MG PO TABS: 1.0000 | ORAL_TABLET | ORAL | 0 refills | Status: DC | PRN

## 2017-08-09 MED ORDER — PROPOFOL 10 MG/ML IV BOLUS
INTRAVENOUS | Status: DC | PRN
  Administered 2017-08-09: 150 mg via INTRAVENOUS

## 2017-08-09 MED ORDER — DORZOLAMIDE HCL-TIMOLOL MAL 2-0.5 % OP SOLN
OPHTHALMIC | Status: DC | PRN
  Administered 2017-08-09: 1 [drp] via OPHTHALMIC

## 2017-08-09 MED ORDER — BSS IO SOLN
INTRAOCULAR | Status: AC
  Filled 2017-08-09: qty 15

## 2017-08-09 MED ORDER — CARBACHOL 0.01 % IO SOLN
INTRAOCULAR | Status: AC
  Filled 2017-08-09: qty 1.5

## 2017-08-09 MED ORDER — BRIMONIDINE TARTRATE 0.2 % OP SOLN
OPHTHALMIC | Status: AC
  Filled 2017-08-09: qty 5

## 2017-08-09 MED ORDER — BUPIVACAINE HCL (PF) 0.75 % IJ SOLN
INTRAMUSCULAR | Status: DC | PRN
  Administered 2017-08-09: 5 mL

## 2017-08-09 MED ORDER — ACETAZOLAMIDE SODIUM 500 MG IJ SOLR
INTRAMUSCULAR | Status: AC
  Filled 2017-08-09: qty 500

## 2017-08-09 MED ORDER — PREDNISOLONE ACETATE 1 % OP SUSP
OPHTHALMIC | Status: DC | PRN
  Administered 2017-08-09: 1 [drp] via OPHTHALMIC

## 2017-08-09 MED ORDER — LIDOCAINE HCL (PF) 4 % IJ SOLN
INTRAMUSCULAR | Status: AC
  Filled 2017-08-09: qty 5

## 2017-08-09 MED ORDER — STERILE WATER FOR INJECTION IJ SOLN
INTRAMUSCULAR | Status: AC
  Filled 2017-08-09: qty 10

## 2017-08-09 MED ORDER — GATIFLOXACIN 0.5 % OP SOLN
OPHTHALMIC | Status: DC | PRN
  Administered 2017-08-09: 1 [drp] via OPHTHALMIC

## 2017-08-09 MED ORDER — HYDROMORPHONE HCL 2 MG/ML IJ SOLN
INTRAMUSCULAR | Status: AC
  Filled 2017-08-09: qty 1

## 2017-08-09 MED ORDER — LIDOCAINE HCL (CARDIAC) PF 100 MG/5ML IV SOSY
PREFILLED_SYRINGE | INTRAVENOUS | Status: DC | PRN
  Administered 2017-08-09: 60 mg via INTRAVENOUS

## 2017-08-09 MED ORDER — LIDOCAINE HCL (PF) 2 % IJ SOLN
INTRAMUSCULAR | Status: AC
  Filled 2017-08-09: qty 10

## 2017-08-09 MED ORDER — LIDOCAINE HCL 2 % IJ SOLN
INTRAMUSCULAR | Status: DC | PRN
  Administered 2017-08-09: 5 mL

## 2017-08-09 MED ORDER — EPINEPHRINE PF 1 MG/ML IJ SOLN
INTRAMUSCULAR | Status: AC
  Filled 2017-08-09: qty 1

## 2017-08-09 MED ORDER — DEXAMETHASONE SODIUM PHOSPHATE 10 MG/ML IJ SOLN
INTRAMUSCULAR | Status: DC | PRN
  Administered 2017-08-09: 10 mg via INTRAVENOUS

## 2017-08-09 MED ORDER — BUPIVACAINE HCL (PF) 0.75 % IJ SOLN
INTRAMUSCULAR | Status: AC
Start: 2017-08-09 — End: 2017-08-09
  Filled 2017-08-09: qty 10

## 2017-08-09 MED ORDER — SODIUM CHLORIDE 0.9 % IJ SOLN
INTRAMUSCULAR | Status: AC
  Filled 2017-08-09: qty 10

## 2017-08-09 MED ORDER — PHENYLEPHRINE HCL 10 % OP SOLN
1.0000 [drp] | OPHTHALMIC | Status: AC | PRN
  Administered 2017-08-09 (×3): 1 [drp] via OPHTHALMIC
  Filled 2017-08-09: qty 5

## 2017-08-09 MED ORDER — DEXTROSE 50 % IV SOLN
INTRAVENOUS | Status: AC
  Filled 2017-08-09: qty 50

## 2017-08-09 MED ORDER — TRIAMCINOLONE ACETONIDE 40 MG/ML IJ SUSP
INTRAMUSCULAR | Status: AC
  Filled 2017-08-09: qty 5

## 2017-08-09 MED ORDER — FLUORESCEIN SODIUM 1 MG OP STRP
1.0000 | ORAL_STRIP | Freq: Once | OPHTHALMIC | Status: AC
  Administered 2017-08-09: 1 via OPHTHALMIC
  Filled 2017-08-09: qty 1

## 2017-08-09 MED ORDER — ROCURONIUM BROMIDE 100 MG/10ML IV SOLN
INTRAVENOUS | Status: DC | PRN
  Administered 2017-08-09: 20 mg via INTRAVENOUS
  Administered 2017-08-09: 50 mg via INTRAVENOUS

## 2017-08-09 MED ORDER — DEXAMETHASONE SODIUM PHOSPHATE 10 MG/ML IJ SOLN
INTRAMUSCULAR | Status: AC
  Filled 2017-08-09: qty 1

## 2017-08-09 MED ORDER — TROPICAMIDE 1 % OP SOLN
1.0000 [drp] | OPHTHALMIC | Status: AC | PRN
  Administered 2017-08-09 (×3): 1 [drp] via OPHTHALMIC
  Filled 2017-08-09: qty 15

## 2017-08-09 MED ORDER — BACITRACIN-POLYMYXIN B 500-10000 UNIT/GM OP OINT
TOPICAL_OINTMENT | OPHTHALMIC | Status: DC | PRN
  Administered 2017-08-09: 1 via OPHTHALMIC

## 2017-08-09 MED ORDER — NA CHONDROIT SULF-NA HYALURON 40-30 MG/ML IO SOLN
INTRAOCULAR | Status: AC
  Filled 2017-08-09: qty 1

## 2017-08-09 MED ORDER — ATROPINE SULFATE 1 % OP SOLN
OPHTHALMIC | Status: AC
  Filled 2017-08-09: qty 5

## 2017-08-09 MED ORDER — NA CHONDROIT SULF-NA HYALURON 40-30 MG/ML IO SOLN
INTRAOCULAR | Status: DC | PRN
  Administered 2017-08-09: 0.5 mL via INTRAOCULAR

## 2017-08-09 MED ORDER — ATROPINE SULFATE 1 % OP SOLN
OPHTHALMIC | Status: DC | PRN
  Administered 2017-08-09: 1 [drp] via OPHTHALMIC

## 2017-08-09 MED ORDER — BACITRACIN-POLYMYXIN B 500-10000 UNIT/GM OP OINT
TOPICAL_OINTMENT | OPHTHALMIC | Status: AC
  Filled 2017-08-09: qty 3.5

## 2017-08-09 MED ORDER — FENTANYL CITRATE (PF) 250 MCG/5ML IJ SOLN
INTRAMUSCULAR | Status: AC
  Filled 2017-08-09: qty 5

## 2017-08-09 MED ORDER — TETRACAINE HCL 0.5 % OP SOLN
2.0000 [drp] | Freq: Once | OPHTHALMIC | Status: AC
  Administered 2017-08-09: 2 [drp] via OPHTHALMIC
  Filled 2017-08-09: qty 4

## 2017-08-09 MED ORDER — ONDANSETRON HCL 4 MG/2ML IJ SOLN
INTRAMUSCULAR | Status: DC | PRN
  Administered 2017-08-09: 4 mg via INTRAVENOUS

## 2017-08-09 MED ORDER — EPINEPHRINE PF 1 MG/ML IJ SOLN
INTRAOCULAR | Status: DC | PRN
  Administered 2017-08-09: 14:00:00

## 2017-08-09 MED ORDER — STERILE WATER FOR INJECTION IJ SOLN
INTRAMUSCULAR | Status: DC | PRN
  Administered 2017-08-09: 20 mL

## 2017-08-09 MED ORDER — GADOBENATE DIMEGLUMINE 529 MG/ML IV SOLN
20.0000 mL | Freq: Once | INTRAVENOUS | Status: AC | PRN
  Administered 2017-08-09: 16 mL via INTRAVENOUS

## 2017-08-09 MED ORDER — BSS PLUS IO SOLN
INTRAOCULAR | Status: AC
  Filled 2017-08-09: qty 500

## 2017-08-09 MED ORDER — 0.9 % SODIUM CHLORIDE (POUR BTL) OPTIME
TOPICAL | Status: DC | PRN
  Administered 2017-08-09: 1000 mL

## 2017-08-09 MED ORDER — TOBRAMYCIN-DEXAMETHASONE 0.3-0.1 % OP OINT
TOPICAL_OINTMENT | OPHTHALMIC | Status: AC
  Filled 2017-08-09: qty 3.5

## 2017-08-09 MED ORDER — TRIAMCINOLONE ACETONIDE 40 MG/ML IJ SUSP
INTRAMUSCULAR | Status: DC | PRN
  Administered 2017-08-09: 1 mL

## 2017-08-09 MED ORDER — GATIFLOXACIN 0.5 % OP SOLN
OPHTHALMIC | Status: AC
  Filled 2017-08-09: qty 2.5

## 2017-08-09 MED ORDER — PREDNISOLONE ACETATE 1 % OP SUSP
OPHTHALMIC | Status: AC
  Filled 2017-08-09: qty 5

## 2017-08-09 MED ORDER — SUGAMMADEX SODIUM 200 MG/2ML IV SOLN
INTRAVENOUS | Status: DC | PRN
  Administered 2017-08-09: 160 mg via INTRAVENOUS

## 2017-08-09 MED ORDER — PROMETHAZINE HCL 25 MG/ML IJ SOLN: 6.2500 mg | INTRAMUSCULAR | Status: DC | PRN

## 2017-08-09 MED ORDER — POLYMYXIN B SULFATE 500000 UNITS IJ SOLR
INTRAMUSCULAR | Status: AC
  Filled 2017-08-09: qty 500000

## 2017-08-09 MED ORDER — BRIMONIDINE TARTRATE 0.2 % OP SOLN
OPHTHALMIC | Status: DC | PRN
  Administered 2017-08-09: 1 [drp] via OPHTHALMIC

## 2017-08-09 MED ORDER — MIDAZOLAM HCL 5 MG/5ML IJ SOLN
INTRAMUSCULAR | Status: DC | PRN
  Administered 2017-08-09: 2 mg via INTRAVENOUS

## 2017-08-09 MED ORDER — INDOCYANINE GREEN 25 MG IV SOLR
INTRAVENOUS | Status: AC
  Filled 2017-08-09: qty 25

## 2017-08-09 MED ORDER — SODIUM CHLORIDE 0.9 % IV SOLN
INTRAVENOUS | Status: DC
Start: 2017-08-09 — End: 2017-08-09
  Administered 2017-08-09: 13:00:00 via INTRAVENOUS

## 2017-08-09 MED ORDER — LACTATED RINGERS IV SOLN
INTRAVENOUS | Status: DC | PRN
Start: 2017-08-09 — End: 2017-08-09
  Administered 2017-08-09: 14:00:00 via INTRAVENOUS

## 2017-08-09 MED ORDER — FENTANYL CITRATE (PF) 100 MCG/2ML IJ SOLN
INTRAMUSCULAR | Status: DC | PRN
  Administered 2017-08-09 (×2): 50 ug via INTRAVENOUS
  Administered 2017-08-09: 25 ug via INTRAVENOUS
  Administered 2017-08-09 (×2): 50 ug via INTRAVENOUS
  Administered 2017-08-09: 25 ug via INTRAVENOUS

## 2017-08-09 MED ORDER — DORZOLAMIDE HCL-TIMOLOL MAL 2-0.5 % OP SOLN
OPHTHALMIC | Status: AC
  Filled 2017-08-09: qty 10

## 2017-08-09 MED ORDER — PROPARACAINE HCL 0.5 % OP SOLN
1.0000 [drp] | OPHTHALMIC | Status: AC | PRN
  Administered 2017-08-09 (×3): 1 [drp] via OPHTHALMIC
  Filled 2017-08-09: qty 15

## 2017-08-09 MED ORDER — CYCLOPENTOLATE HCL 1 % OP SOLN
1.0000 [drp] | OPHTHALMIC | Status: AC | PRN
  Administered 2017-08-09 (×3): 1 [drp] via OPHTHALMIC
  Filled 2017-08-09: qty 2

## 2017-08-09 MED ORDER — HYDROMORPHONE HCL 2 MG/ML IJ SOLN
0.3000 mg | INTRAMUSCULAR | Status: DC | PRN
  Administered 2017-08-09: 0.3 mg via INTRAVENOUS

## 2017-08-09 MED ORDER — CEFTAZIDIME 1 G IJ SOLR
INTRAMUSCULAR | Status: AC
  Filled 2017-08-09: qty 1

## 2017-08-09 SURGICAL SUPPLY — 66 items
APPLICATOR DR MATTHEWS STRL (MISCELLANEOUS) ×12 IMPLANT
BAND SCLERAL BUCKLING TYPE 41 (Ophthalmic Related) ×3 IMPLANT
BANDAGE EYE OVAL (MISCELLANEOUS) IMPLANT
BLADE EYE CATARACT 19 1.4 BEAV (BLADE) IMPLANT
BLADE MVR KNIFE 19G (BLADE) IMPLANT
CABLE BIPOLOR RESECTION CORD (MISCELLANEOUS) ×3 IMPLANT
CANNULA DUAL BORE 23G (CANNULA) IMPLANT
CANNULA FLEX TIP 25G (CANNULA) ×3 IMPLANT
CANNULA VLV SOFT TIP 25GA (OPHTHALMIC) IMPLANT
COVER SURGICAL LIGHT HANDLE (MISCELLANEOUS) ×3 IMPLANT
DRAPE MICROSCOPE LEICA 46X105 (MISCELLANEOUS) ×3 IMPLANT
DRAPE OPHTHALMIC 77X100 STRL (CUSTOM PROCEDURE TRAY) ×3 IMPLANT
ERASER HMR WETFIELD 23G BP (MISCELLANEOUS) IMPLANT
FILTER BLUE MILLIPORE (MISCELLANEOUS) IMPLANT
FILTER STRAW FLUID ASPIR (MISCELLANEOUS) IMPLANT
FORCEPS GRIESHABER ILM 25G A (INSTRUMENTS) IMPLANT
GAS AUTO FILL CONSTEL (OPHTHALMIC) ×3
GAS AUTO FILL CONSTELLATION (OPHTHALMIC) ×2 IMPLANT
GLOVE BIOGEL M 7.0 STRL (GLOVE) ×3 IMPLANT
GLOVE BIOGEL PI IND STRL 6.5 (GLOVE) ×4 IMPLANT
GLOVE BIOGEL PI INDICATOR 6.5 (GLOVE) ×2
GOWN STRL REUS W/ TWL LRG LVL3 (GOWN DISPOSABLE) ×4 IMPLANT
GOWN STRL REUS W/ TWL XL LVL3 (GOWN DISPOSABLE) ×2 IMPLANT
GOWN STRL REUS W/TWL LRG LVL3 (GOWN DISPOSABLE) ×2
GOWN STRL REUS W/TWL XL LVL3 (GOWN DISPOSABLE) ×1
KIT BASIN OR (CUSTOM PROCEDURE TRAY) ×3 IMPLANT
KIT PERFLUORON PROCEDURE 5ML (MISCELLANEOUS) IMPLANT
KNIFE CRESCENT 1.75 EDGEAHEAD (BLADE) IMPLANT
KNIFE GRIESHABER SHARP 2.5MM (MISCELLANEOUS) ×3 IMPLANT
LENS BIOM SUPER VIEW SET DISP (OPHTHALMIC RELATED) IMPLANT
LENS VITRECTOMY FLAT OCLR DISP (MISCELLANEOUS) IMPLANT
MICROPICK 25G (MISCELLANEOUS)
NEEDLE 18GX1X1/2 (RX/OR ONLY) (NEEDLE) ×3 IMPLANT
NEEDLE 25GX 5/8IN NON SAFETY (NEEDLE) ×6 IMPLANT
NEEDLE HYPO 30X.5 LL (NEEDLE) ×6 IMPLANT
NEEDLE PRECISIONGLIDE 27X1.5 (NEEDLE) IMPLANT
NS IRRIG 1000ML POUR BTL (IV SOLUTION) ×3 IMPLANT
PACK VITRECTOMY CUSTOM (CUSTOM PROCEDURE TRAY) ×3 IMPLANT
PAD ARMBOARD 7.5X6 YLW CONV (MISCELLANEOUS) ×6 IMPLANT
PAK PIK VITRECTOMY CVS 25GA (OPHTHALMIC) ×3 IMPLANT
PIC ILLUMINATED 25G (OPHTHALMIC)
PICK MICROPICK 25G (MISCELLANEOUS) IMPLANT
PIK ILLUMINATED 25G (OPHTHALMIC) IMPLANT
PROBE DIATHERMY DSP 27GA (MISCELLANEOUS) ×3 IMPLANT
PROBE ENDO DIATHERMY 25G (MISCELLANEOUS) IMPLANT
PROBE LASER ILLUM FLEX CVD 25G (OPHTHALMIC) ×3 IMPLANT
REPL STRA BRUSH NEEDLE (NEEDLE) IMPLANT
RESERVOIR BACK FLUSH (MISCELLANEOUS) IMPLANT
ROLLS DENTAL (MISCELLANEOUS) ×6 IMPLANT
SCRAPER DIAMOND 25GA (OPHTHALMIC RELATED) IMPLANT
SPONGE SURGIFOAM ABS GEL 12-7 (HEMOSTASIS) IMPLANT
STOPCOCK 4 WAY LG BORE MALE ST (IV SETS) IMPLANT
SUT CHROMIC 7 0 TG140 8 (SUTURE) IMPLANT
SUT ETHILON 5.0 S-24 (SUTURE) ×3 IMPLANT
SUT ETHILON 9 0 TG140 8 (SUTURE) IMPLANT
SUT SILK 2 0 (SUTURE) ×1
SUT SILK 2-0 18XBRD TIE 12 (SUTURE) ×2 IMPLANT
SUT VICRYL 7 0 TG140 8 (SUTURE) ×3 IMPLANT
SYR 10ML LL (SYRINGE) IMPLANT
SYR 20CC LL (SYRINGE) ×6 IMPLANT
SYR 5ML LL (SYRINGE) IMPLANT
SYR TB 1ML LUER SLIP (SYRINGE) ×9 IMPLANT
TOWEL NATURAL 6PK STERILE (DISPOSABLE) ×3 IMPLANT
TRAY FOLEY CATH 14FR (SET/KITS/TRAYS/PACK) IMPLANT
TUBING HIGH PRESS EXTEN 6IN (TUBING) IMPLANT
WATER STERILE IRR 1000ML POUR (IV SOLUTION) ×3 IMPLANT

## 2017-08-09 NOTE — Discharge Instructions (Signed)
POSTOPERATIVE INSTRUCTIONS ° °Your doctor has performed vitreoretinal surgery on you at . [facility]. ° °- Keep eye patched and shielded until seen by Dr. Adline Kirshenbaum 9 AM tomorrow in clinic °- Do not use drops until return °- FACE DOWN POSITIONING WHILE AWAKE °- Sleep with belly down or on left side, avoid laying flat on back.   ° °- No strenuous bending, stooping or lifting. ° °- You may not drive until further notice. ° °- If your doctor used a gas bubble in your eye during the procedure he will advise you on postoperative positioning. If you have a gas bubble you will be wearing a green bracelet that was applied in the operating room. The green bracelet should stay on as long as the gas bubble is in your eye. While the gas bubble is present you should not fly in an airplane. If you require general anesthesia while the gas bubble is present you must notify your anesthesiologist that an intraocular gas bubble is present so he can take the appropriate precautions. ° °- Tylenol or any other over-the-counter pain reliever can be used according to your doctor. If more pain medicine is required, your doctor will have a prescription for you. ° °- You may read, go up and down stairs, and watch television. ° ° ° ° Steffie Waggoner, M.D., Ph.D. ° °

## 2017-08-09 NOTE — ED Provider Notes (Addendum)
[facility]-EMERGENCY DEPT Provider Note   CSN: [id number] Arrival date & time: 08/09/17  0343     History   Chief Complaint Chief Complaint  Patient presents with  . Eye Problem    HPI Gail Duncan is a 52 y.o. female.  HPI   52 yo F here with visual field loss. Pt reports that over the last several days, she's noticed dark spots and now a large dark "wedge" in her right nasal visual field. She first noticed it and thought it was 2/2 floaters (h/o same), but states that the visual loss has now worsened and is persistent. She's not had any associated HA, weakness, or numbness. No headache. She does have a mild R eye pressure but this has been constant. Denies any difficulty speaking or swallowing. No h/o previous eye injuries or problems other than mild nearsightedness. No h/o stroke. Sx do not improve or change when she opens/closes her other eye. No left-sided eye.visual sx.  Past Medical History:  Diagnosis Date  . Anemia   . Hearing loss in right ear    Unknown cause, had full work up with Specialist in [place]  . History of chicken pox     Patient Active Problem List   Diagnosis Date Noted  . Lymphadenopathy of right cervical region 08/25/2016  . Iron deficiency 12/26/2014  . Obesity (BMI 30-39.9) 12/26/2014    Past Surgical History:  Procedure Laterality Date  . ABDOMINAL EXPLORATION SURGERY    . ABLATION    . DENTAL SURGERY    . EXPLORATORY LAPAROTOMY     "fibrous"     OB History    Gravida  2   Para  1   Term      Preterm      AB      Living  1     SAB      TAB      Ectopic      Multiple      Live Births               Home Medications    Prior to Admission medications   Medication Sig Start Date End Date Taking? Authorizing Provider  ANUCORT-HC 25 MG suppository Place 25 mg rectally 2 (two) times daily as needed for hemorrhoids or anal itching.  07/28/17  Yes [provider]  BIOTIN PO Take 1 capsule by  mouth daily.   Yes [provider]  Cholecalciferol (VITAMIN D3) 5000 units CAPS Take 5,000 capsules by mouth 3 (three) times a week.    Yes [provider]  clarithromycin (BIAXIN) 500 MG tablet Take 500 mg by mouth 2 (two) times daily. 07/25/17 08/12/17 Yes [provider]  Coenzyme Q10-Vitamin E (COQ10-VITAMIN E) 100-10 MG-UNIT CAPS Take 1 capsule by mouth daily.    Yes [provider]  Flaxseed, Linseed, (FLAXSEED OIL PO) Take 15 mLs by mouth daily.    Yes [provider]  pantoprazole (PROTONIX) 40 MG tablet Take 40 mg by mouth 2 (two) times daily.   Yes [provider]    Family History Family History  Problem Relation Age of Onset  . Diabetes Mother   . Prostate cancer Father   . Hypertension Father   . Lung cancer Paternal Grandfather     Social History Social History   Tobacco Use  . Smoking status: Never Smoker  . Smokeless tobacco: Never Used  Substance Use Topics  . Alcohol use: Yes    Alcohol/week:  0.0 oz    Comment: occasional social use  . Drug use: No     Allergies   Asa [aspirin]   Review of Systems Review of Systems  Constitutional: Negative for chills and fever.  HENT: Negative for congestion, rhinorrhea and sore throat.   Eyes: Positive for visual disturbance.  Respiratory: Negative for cough, shortness of breath and wheezing.   Cardiovascular: Negative for chest pain and leg swelling.  Gastrointestinal: Negative for abdominal pain, diarrhea, nausea and vomiting.  Genitourinary: Negative for dysuria, flank pain, vaginal bleeding and vaginal discharge.  Musculoskeletal: Negative for neck pain.  Skin: Negative for rash.  Allergic/Immunologic: Negative for immunocompromised state.  Neurological: Negative for syncope and headaches.  Hematological: Does not bruise/bleed easily.  All other systems reviewed and are negative.    Physical Exam Updated Vital Signs BP (!) 151/88   Pulse 74   Temp 98  F (36.7 C) (Oral)   Resp 14   Ht 5\' 2"  (1.575 m)   Wt 77.1 kg (170 lb)   LMP 12/22/2014   SpO2 99%   BMI 31.09 kg/m   Physical Exam  Constitutional: She is oriented to person, place, and time. She appears well-developed and well-nourished. No distress.  HENT:  Head: Normocephalic and atraumatic.  Eyes: Conjunctivae are normal.  EOM normal. PERRL with no apparent APD. IOP 12 OS, 13 OD. Visualized retina and disc appear normal. Anterior chamber unremarkable. No fluorescein uptake.   Neck: Neck supple.  Cardiovascular: Normal rate, regular rhythm and normal heart sounds. Exam reveals no friction rub.  No murmur heard. Pulmonary/Chest: Effort normal and breath sounds normal. No respiratory distress. She has no wheezes. She has no rales.  Abdominal: She exhibits no distension.  Musculoskeletal: She exhibits no edema.  Neurological: She is alert and oriented to person, place, and time. She exhibits normal muscle tone.  Skin: Skin is warm. Capillary refill takes less than 2 seconds.  Psychiatric: She has a normal mood and affect.  Nursing note and vitals reviewed.   Neurological Exam:  Mental Status: Alert and oriented to person, place, and time. Attention and concentration normal. Speech clear. Recent memory is intact. Cranial Nerves:  Diminished vision in nasal inferior quadrant of right eye, visual fields o/w intact. EOMI and PERRLA. No nystagmus noted. Facial sensation intact at forehead, maxillary cheek, and chin/mandible bilaterally. No facial asymmetry or weakness. Hearing grossly normal. Uvula is midline, and palate elevates symmetrically. Normal SCM and trapezius strength. Tongue midline without fasciculations. Motor: Muscle strength 5/5 in proximal and distal UE and LE bilaterally. No pronator drift. Muscle tone normal. Reflexes: 2+ and symmetrical in all four extremities.  Sensation: Intact to light touch in upper and lower extremities distally bilaterally.  Gait: Normal  without ataxia. Coordination: Normal FTN bilaterally.     ED Treatments / Results  Labs (all labs ordered are listed, but only abnormal results are displayed) Labs Reviewed  BASIC METABOLIC PANEL - Abnormal; Notable for the following components:      Result Value   Glucose, Bld 103 (*)    All other components within normal limits  CBC WITH DIFFERENTIAL/PLATELET  PROTIME-INR    EKG None  Radiology Ct Head Wo Contrast  Result Date: 08/09/2017 CLINICAL DATA:  Focal neurologic deficit greater than 6 hours. Stroke suspected. Right vision changes. EXAM: CT HEAD WITHOUT CONTRAST TECHNIQUE: Contiguous axial images were obtained from the base of the skull through the vertex without intravenous contrast. COMPARISON:  None. FINDINGS: Brain: No intracranial hemorrhage, mass effect,  or midline shift. No hydrocephalus. The basilar cisterns are patent. No evidence of territorial infarct or acute ischemia. No extra-axial or intracranial fluid collection. Vascular: No hyperdense vessel or unexpected calcification. Skull: No fracture or focal lesion. Sinuses/Orbits: Paranasal sinuses and mastoid air cells are clear. The visualized orbits are unremarkable. Other: None. IMPRESSION: No acute intracranial abnormality. Electronically Signed   By: Rubye Oaks M.D.   On: 08/09/2017 06:03   Mr Maxine Glenn Head Wo Contrast  Result Date: 08/09/2017 CLINICAL DATA:  Right eye vision changes. EXAM: MRI HEAD AND ORBITS WITHOUT AND WITH CONTRAST MRA HEAD WITHOUT CONTRAST TECHNIQUE: Multiplanar, multiecho pulse sequences of the brain and surrounding structures were obtained without and with intravenous contrast. Multiplanar, multiecho pulse sequences of the orbits and surrounding structures were obtained including fat saturation techniques, before and after intravenous contrast administration. Angiographic images of the head were obtained using MRA technique without contrast. CONTRAST:  16mL MULTIHANCE GADOBENATE DIMEGLUMINE  529 MG/ML IV SOLN COMPARISON:  Head CT 08/09/2017 FINDINGS: MRI HEAD FINDINGS Brain: There is no evidence of acute infarct, intracranial hemorrhage, mass, midline shift, or extra-axial fluid collection. The ventricles and sulci are normal. The brain is normal in signal. No abnormal enhancement is identified. Vascular: Major intracranial vascular flow voids are preserved. Skull and upper cervical spine: Unremarkable bone marrow signal. Other: Scattered small benign-appearing scalp nodules measuring up to 1 cm in size, possibly sebaceous cysts. MRI ORBITS FINDINGS Orbits: The globes appear intact. The optic nerve complexes are unremarkable. No orbital mass or inflammation is identified. The extraocular muscles and lacrimal glands are symmetric and normal in appearance. Visualized sinuses: The paranasal sinuses are clear. Trapped fluid or a small mucocele is noted in the left petrous apex without associated enhancement. Soft tissues: None. Limited intracranial: Unremarkable pituitary gland, optic chiasm, and cavernous sinuses. MRA HEAD FINDINGS The visualized distal vertebral arteries are widely patent to the basilar and codominant. Visualized cerebellar branches are unremarkable. The basilar artery is widely patent. Posterior communicating arteries are not identified and may be diminutive or absent. PCAs are patent without evidence of significant stenosis. The internal carotid arteries are widely patent from skull base to carotid termini. ACAs and MCAs are patent without evidence of proximal branch occlusion or significant proximal stenosis. No aneurysm is identified. IMPRESSION: 1. Unremarkable appearance of the brain and orbits. No acute intracranial abnormality. 2. Left petrous apex fluid/small mucocele. 3. Negative head MRA. Electronically Signed   By: Sebastian Ache M.D.   On: 08/09/2017 07:43   Mr Brain W And Wo Contrast  Result Date: 08/09/2017 CLINICAL DATA:  Right eye vision changes. EXAM: MRI HEAD AND  ORBITS WITHOUT AND WITH CONTRAST MRA HEAD WITHOUT CONTRAST TECHNIQUE: Multiplanar, multiecho pulse sequences of the brain and surrounding structures were obtained without and with intravenous contrast. Multiplanar, multiecho pulse sequences of the orbits and surrounding structures were obtained including fat saturation techniques, before and after intravenous contrast administration. Angiographic images of the head were obtained using MRA technique without contrast. CONTRAST:  16mL MULTIHANCE GADOBENATE DIMEGLUMINE 529 MG/ML IV SOLN COMPARISON:  Head CT 08/09/2017 FINDINGS: MRI HEAD FINDINGS Brain: There is no evidence of acute infarct, intracranial hemorrhage, mass, midline shift, or extra-axial fluid collection. The ventricles and sulci are normal. The brain is normal in signal. No abnormal enhancement is identified. Vascular: Major intracranial vascular flow voids are preserved. Skull and upper cervical spine: Unremarkable bone marrow signal. Other: Scattered small benign-appearing scalp nodules measuring up to 1 cm in size, possibly sebaceous cysts. MRI ORBITS  FINDINGS Orbits: The globes appear intact. The optic nerve complexes are unremarkable. No orbital mass or inflammation is identified. The extraocular muscles and lacrimal glands are symmetric and normal in appearance. Visualized sinuses: The paranasal sinuses are clear. Trapped fluid or a small mucocele is noted in the left petrous apex without associated enhancement. Soft tissues: None. Limited intracranial: Unremarkable pituitary gland, optic chiasm, and cavernous sinuses. MRA HEAD FINDINGS The visualized distal vertebral arteries are widely patent to the basilar and codominant. Visualized cerebellar branches are unremarkable. The basilar artery is widely patent. Posterior communicating arteries are not identified and may be diminutive or absent. PCAs are patent without evidence of significant stenosis. The internal carotid arteries are widely patent  from skull base to carotid termini. ACAs and MCAs are patent without evidence of proximal branch occlusion or significant proximal stenosis. No aneurysm is identified. IMPRESSION: 1. Unremarkable appearance of the brain and orbits. No acute intracranial abnormality. 2. Left petrous apex fluid/small mucocele. 3. Negative head MRA. Electronically Signed   By: Sebastian Ache M.D.   On: 08/09/2017 07:43   Mr Rockwell [place] ON Contrast  Result Date: 08/09/2017 CLINICAL DATA:  Right eye vision changes. EXAM: MRI HEAD AND ORBITS WITHOUT AND WITH CONTRAST MRA HEAD WITHOUT CONTRAST TECHNIQUE: Multiplanar, multiecho pulse sequences of the brain and surrounding structures were obtained without and with intravenous contrast. Multiplanar, multiecho pulse sequences of the orbits and surrounding structures were obtained including fat saturation techniques, before and after intravenous contrast administration. Angiographic images of the head were obtained using MRA technique without contrast. CONTRAST:  16mL MULTIHANCE GADOBENATE DIMEGLUMINE 529 MG/ML IV SOLN COMPARISON:  Head CT 08/09/2017 FINDINGS: MRI HEAD FINDINGS Brain: There is no evidence of acute infarct, intracranial hemorrhage, mass, midline shift, or extra-axial fluid collection. The ventricles and sulci are normal. The brain is normal in signal. No abnormal enhancement is identified. Vascular: Major intracranial vascular flow voids are preserved. Skull and upper cervical spine: Unremarkable bone marrow signal. Other: Scattered small benign-appearing scalp nodules measuring up to 1 cm in size, possibly sebaceous cysts. MRI ORBITS FINDINGS Orbits: The globes appear intact. The optic nerve complexes are unremarkable. No orbital mass or inflammation is identified. The extraocular muscles and lacrimal glands are symmetric and normal in appearance. Visualized sinuses: The paranasal sinuses are clear. Trapped fluid or a small mucocele is noted in the left petrous apex without  associated enhancement. Soft tissues: None. Limited intracranial: Unremarkable pituitary gland, optic chiasm, and cavernous sinuses. MRA HEAD FINDINGS The visualized distal vertebral arteries are widely patent to the basilar and codominant. Visualized cerebellar branches are unremarkable. The basilar artery is widely patent. Posterior communicating arteries are not identified and may be diminutive or absent. PCAs are patent without evidence of significant stenosis. The internal carotid arteries are widely patent from skull base to carotid termini. ACAs and MCAs are patent without evidence of proximal branch occlusion or significant proximal stenosis. No aneurysm is identified. IMPRESSION: 1. Unremarkable appearance of the brain and orbits. No acute intracranial abnormality. 2. Left petrous apex fluid/small mucocele. 3. Negative head MRA. Electronically Signed   By: Sebastian Ache M.D.   On: 08/09/2017 07:43    Procedures Procedures (including critical care time)  Medications Ordered in ED Medications  tetracaine (PONTOCAINE) 0.5 % ophthalmic solution 2 drop (2 drops Both Eyes Given by Other 08/09/17 0443)  fluorescein ophthalmic strip 1 strip (1 strip Both Eyes Given by Other 08/09/17 0443)  gadobenate dimeglumine (MULTIHANCE) injection 20 mL (16 mLs Intravenous Contrast Given 08/09/17  [id number])    EMERGENCY DEPARTMENT US OCULAR EXAM "Study: Limited Ultrasound of Orbit "  INDICATIONS: Floaters/Flashes Linear probe utilized to obtain images in both long and short axis of the orbit having the patient look left and right if possible.  PERFORMED BY: Myself IMAGES ARCHIVED?: Yes LIMITATIONS: none VIEWS USED: Right orbit INTERPRETATION: Lens in proper position, Normal optic nerve diameter; no large retinal detachment noted   Initial Impression / Assessment and Plan / ED Course  I have reviewed the triage vital signs and the nursing notes.  Pertinent labs & imaging results that were available during my  care of the patient were reviewed by me and considered in my medical decision making (see chart for details).     52 yo F here with R sided visual field cut. Pt with monocular, unilateral hemianopsia on exam. Visual acuity normal. U/S without large RD though clinically, concern for small RD not apparent on gross exam or U/S. Discussed with Neuro, Radiology - they recommend MR Orbit for eval of mass/compression lesion, ischemia which was obtained and is neg. Discussed with Dr. Vanessa BarbaraZamora of Ophthalmology as well - will send immediately to clinic for eval of possible small RD. Pt updated and in agreement. No other apparent emergent pathologies. IOP normal bilaterally.  Final Clinical Impressions(s) / ED Diagnoses   Final diagnoses:  Visual field defect    ED Discharge Orders    None       Shaune PollackIsaacs, Sarea Fyfe, MD 08/09/17 [id number]    Shaune PollackIsaacs, Cage Gupton, MD 08/09/17 1525

## 2017-08-09 NOTE — Progress Notes (Signed)
[facility] Note  08/10/2017     CHIEF COMPLAINT Patient presents for Post-op Follow-up   HISTORY OF PRESENT ILLNESS: Gail Duncan is a 52 y.o. female who presents to the clinic today for:   HPI    Post-op Follow-up    In right eye.  Discomfort includes pain and discharge.  Negative for itching, foreign body sensation, tearing, floaters and none.  Vision is stable.  I, the attending physician,  performed the HPI with the patient and updated documentation appropriately.          Comments    Pt presents for POD 1, pt states she had a hard time bieng on her stomach bc she has acid reflux, so she tried to be on her stomach and then would sit up with her head down, states she was able to keep the head down position as instructed, states she did not eat much bc her stomach was upset and she still has shakes when she tries to exert herself, states she is not in any pain, OS is crusted over so shes ubale to see out of it        Last edited by Bernarda Caffey, MD on 08/10/2017 11:50 AM. (History)      Referring physician: Ma Hillock, DO [id number] [place], [place] [address]  HISTORICAL INFORMATION:   Selected notes from the MEDICAL RECORD NUMBER Referred by ED for possible RD   CURRENT MEDICATIONS: No current outpatient medications on file. (Ophthalmic Drugs)   No current facility-administered medications for this visit.  (Ophthalmic Drugs)   Current Outpatient Medications (Other)  Medication Sig  . ANUCORT-HC 25 MG suppository Place 25 mg rectally 2 (two) times daily as needed for hemorrhoids or anal itching.   Marland Kitchen BIOTIN PO Take 1 capsule by mouth daily.  . Cholecalciferol (VITAMIN D3) 5000 units CAPS Take 5,000 capsules by mouth 3 (three) times a week.   . clarithromycin (BIAXIN) 500 MG tablet Take 500 mg by mouth 2 (two) times daily.  . Coenzyme Q10-Vitamin E (COQ10-VITAMIN E) 100-10 MG-UNIT CAPS Take 1 capsule by mouth daily.   . Flaxseed,  Linseed, (FLAXSEED OIL PO) Take 15 mLs by mouth daily.   Marland Kitchen HYDROcodone-acetaminophen (NORCO/VICODIN) 5-325 MG tablet Take 1 tablet by mouth every 4 (four) hours as needed for moderate pain.  . pantoprazole (PROTONIX) 40 MG tablet Take 40 mg by mouth 2 (two) times daily.   No current facility-administered medications for this visit.  (Other)      REVIEW OF SYSTEMS: ROS    Positive for: Eyes   Negative for: Constitutional, Gastrointestinal, Neurological, Skin, Genitourinary, Musculoskeletal, HENT, Endocrine, Cardiovascular, Respiratory, Psychiatric, Allergic/Imm, Heme/Lymph   Last edited by Debbrah Alar, COT on 08/10/2017  9:36 AM. (History)       ALLERGIES Allergies  Allergen Reactions  . Asa [Aspirin] Other (See Comments)    Contraindicated due to medical condition    PAST MEDICAL HISTORY Past Medical History:  Diagnosis Date  . Anemia   . GERD (gastroesophageal reflux disease)   . Hearing loss in right ear    Unknown cause, had full work up with Specialist in [place]  . History of chicken pox    Past Surgical History:  Procedure Laterality Date  . ABDOMINAL EXPLORATION SURGERY    . ABLATION    . DENTAL SURGERY    . EXPLORATORY LAPAROTOMY     "fibrous"    FAMILY HISTORY Family History  Problem Relation  Age of Onset  . Diabetes Mother   . Prostate cancer Father   . Hypertension Father   . Retinal detachment Father   . Lung cancer Paternal Grandfather   . Amblyopia Neg Hx   . Blindness Neg Hx   . Cataracts Neg Hx   . Glaucoma Neg Hx   . Macular degeneration Neg Hx   . Strabismus Neg Hx   . Retinitis pigmentosa Neg Hx     SOCIAL HISTORY Social History   Tobacco Use  . Smoking status: Never Smoker  . Smokeless tobacco: Never Used  Substance Use Topics  . Alcohol use: Yes    Alcohol/week: 0.0 oz    Comment: occasional social use  . Drug use: No         OPHTHALMIC EXAM:  Base Eye Exam    Visual Acuity (Snellen - Linear)      Right Left   Dist cc  HM 20/50 +2   Dist ph cc NI 20/25 -2       Tonometry (Tonopen, 9:44 AM)      Right Left   Pressure 19 18       Pupils      Dark Light Shape React APD   Right 7 7 Round NR None   Left 7 7 Round NR None       Neuro/Psych    Oriented x3:  Yes   Mood/Affect:  Normal       Dilation    Both eyes:  1.0% Mydriacyl, 2.5% Phenylephrine @ 9:44 AM        Slit Lamp and Fundus Exam    External Exam      Right Left   External Edema, Ecchymosis        Slit Lamp Exam      Right Left   Lids/Lashes Normal Normal   Conjunctiva/Sclera 360 Subconjunctival hemorrhage White and quiet   Cornea Central epi defect 1+ Punctate epithelial erosions   Anterior Chamber Deep Deep and quiet   Iris Round and well dilated Round and well dilated   Lens 2+ Nuclear sclerosis, 2+ Cortical cataract, 1-2+ PC feathering  2+ Nuclear sclerosis, 2+ Cortical cataract   Vitreous Post vitrectomy; good gas fill Vitreous syneresis, Posterior vitreous detachment       Fundus Exam      Right Left   Disc Pink and Sharp, mildly Tilted disc Pink and Sharp, mildly Tilted disc   C/D Ratio 0.4 0.4   Macula Flat under gas Flat, mild Retinal pigment epithelial mottling, No heme or edema   Vessels Mild Vascular attenuation Normal   Periphery Retina attached, good buckle height, good laser 360 on buckle and surrounding breaks Attached, pigmented Lattice degeneration with atrophic holes at 0430; lattice at 1200 with atrophic break          IMAGING AND PROCEDURES  Imaging and Procedures for @TODAY @           ASSESSMENT/PLAN:    ICD-10-CM   1. 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 Retinal edema H35.81   5. Posterior vitreous detachment of both eyes H43.813   6. Combined forms of age-related cataract of both eyes H25.813     1. Rhegmatogenous retinal detachment, RIGHT eye - bullous superotemporal  mac on detachment, onset of foveal involvement Saturday, 01/30/17 by history - detached from 1030-1200 oclock, large horseshoe tear from 1030-1100  and linear tear at 1130 within bed of lattice - POD1 s/p SBP + PPV/EL/FAX/14% C3F8 OD, 8.17.18             - doing well this morning             - retina attached and in good position unde gas -- good buckle height and laser around breaks             - IOP mildly elevated             - start   PF 6x/day OD                         zymaxid QID OD                         Atropine BID OD                         Alphagan P BID OD                         Cosopt BID OD                         PSO ung QID OD             - cont face down positioning x3 days; avoid laying flat on back             - eye shield when sleeping             - post op drop and positioning instructions reviewed             - tylenol/ibuprofen for pain             - Rx given for breakthrough pain  - F/U Friday  2,3. Lattice degeneration OU;  w/ atrophic breaks left eye - OD - superotemporal lattice at site of breaks and detachment - OS - patches of lattice at 12 and 430 with a single atrophic breaks at each site - s/p laser retinopexy OS at time of surgery OD - will start PF QID OS x7 days  4. No retinal edema on exam or OCT  5. PVD / vitreous syneresis OU  Discussed findings and prognosis  RTs and RD as above  6. Combined form age-related cataract OU-  - The symptoms of cataract, surgical options, and treatments and risks were discussed with patient. - discussed diagnosis and likely progression following PPV OD - monitor for now   Ophthalmic Meds Ordered this visit:  No orders of the defined types were placed in this encounter.      Return in about 3 days (around 08/13/2017) for POV 2.  There are no Patient Instructions on file for this visit.   Explained the diagnoses, plan, and follow up with the patient and they expressed understanding.  Patient  expressed understanding of the importance of proper follow up care.   This document serves as a record of services personally performed by Gardiner Sleeper, MD, PhD. It was created on their behalf by Ernest Mallick, OA, an ophthalmic assistant. The creation of this record is the provider's dictation and/or activities during the visit.    Electronically signed by: Ernest Mallick, OA  06.10.2019 11:51 AM     Gardiner Sleeper, M.D., Ph.D. Diseases & Surgery  of the Retina and Vitreous [facility]   I have reviewed the above documentation for accuracy and completeness, and I agree with the above. Gardiner Sleeper, M.D., Ph.D. 08/10/17 11:52 AM     Abbreviations: M myopia (nearsighted); A astigmatism; H hyperopia (farsighted); P presbyopia; Mrx spectacle prescription;  CTL contact lenses; OD right eye; OS left eye; OU both eyes  XT exotropia; ET esotropia; PEK punctate epithelial keratitis; PEE punctate epithelial erosions; DES dry eye syndrome; MGD meibomian gland dysfunction; ATs artificial tears; PFAT's preservative free artificial tears; [place] nuclear sclerotic cataract; PSC posterior subcapsular cataract; ERM epi-retinal membrane; PVD posterior vitreous detachment; RD retinal detachment; DM diabetes mellitus; DR diabetic retinopathy; NPDR non-proliferative diabetic retinopathy; PDR proliferative diabetic retinopathy; CSME clinically significant macular edema; DME diabetic macular edema; dbh dot blot hemorrhages; CWS cotton wool spot; POAG primary open angle glaucoma; C/D cup-to-disc ratio; HVF humphrey visual field; GVF goldmann visual field; OCT optical coherence tomography; IOP intraocular pressure; BRVO Branch retinal vein occlusion; CRVO central retinal vein occlusion; CRAO central retinal artery occlusion; BRAO branch retinal artery occlusion; RT retinal tear; SB scleral buckle; PPV pars plana vitrectomy; VH Vitreous hemorrhage; PRP panretinal laser photocoagulation; IVK  intravitreal kenalog; VMT vitreomacular traction; MH Macular hole;  NVD neovascularization of the disc; NVE neovascularization elsewhere; AREDS age related eye disease study; ARMD age related macular degeneration; POAG primary open angle glaucoma; EBMD epithelial/anterior basement membrane dystrophy; ACIOL anterior chamber intraocular lens; IOL intraocular lens; PCIOL posterior chamber intraocular lens; Phaco/IOL phacoemulsification with intraocular lens placement; [place] photorefractive keratectomy; LASIK laser assisted in situ keratomileusis; HTN hypertension; DM diabetes mellitus; COPD chronic obstructive pulmonary disease

## 2017-08-09 NOTE — Discharge Instructions (Addendum)
Go to Dr. Laban EmperorZamora's office immediately. You could still have a small retinal detachment and it's important that you see him to clear this.

## 2017-08-09 NOTE — Op Note (Signed)
Date of procedure: 6.10.19  Surgeon: Rennis Chris, MD, PhD  Assistant: Virgilio Belling, COA  Pre-operative Diagnosis:  1. Macula-on, Rhegmatogenous Retinal Detachment, Right Eye 2. Lattice degeneration with atrophic retinal breaks, Left Eye  Post-operative diagnosis:  1. Macula-on, Rhegmatogenous Retinal Detachment, Right Eye 2. Lattice degeneration with atrophic retinal breaks, Left Eye  Anesthesia: GETA  Procedure: 1. Laser indirect ophthalmoscopy, Left Eye CPT [phone] 2. Scleral buckle procedure, Right Eye 3. 25 gauge pars plana vitrectomy, Right Eye CPT [phone] 4. Fluid-air exchange, Right Eye 5. Endolaser, Right Eye 6. Injection of 14% C3F8 gas  Complications: none Estimated blood loss: minimal Specimens: none  Brief history: The patient has a history of decreased vision in the affected eye, and on examination, was noted to have a macula-on retinal detachment, affecting activities of daily living. The risks, benefits, and alternatives were explained to the patient, including pain, bleeding, infection, loss of vision, double vision, droopy eyelids, and need for more surgeries. Informed consent was obtained from the patient and placed in the chart.    Procedure:    The patient was brought to the preoperative holding area where the correct eye was confirmed and marked. The patient was then brought to the operating room where general endotracheal anesthesia was induced. Prophylactic laser retinopexy was performed via laser indirect ophthalmoscopy to the left eye. Atrophic breaks within lattice degeneration were treated at the 1200 and 0430 positions without complication.   Attention was then turned to the retinal detachment repair, right eye, portion of case. A secondary time-out was performed again to identify the correct patient, eye, surgical procedure, and any allergies. The eye was prepped and draped in the usual sterile ophthalmic fashion followed by placement  of a lid speculum.  A 360 conjunctival peritomy was created using Westcott scissors and 0.12 forceps. Each of the four quadrants between the rectus muscles was dissected using Stevens scissors to detach Tenon's attachments from the globe. Each of the four rectus muscles was isolated on a muscle hook and slung using 2-0 Silk suture in the usual standard fashion. Each of the four quadrants between the rectus muscles was inspected and there were noted to be no areas of scleral thinning. A #41 silicone band was then brought onto the field and was threaded under each rectus muscle. The band was then loosely secured using a #70 Watzke sleeve in the superotemporal quadrant The band was then sutured to the sclera in each quadrant using 5-0 nylon sutures passed partial thickness through the sclera in a horizontal mattress fashion. The scleral buckle was then tightened to the appropriate height with two locking needle drivers. Attention was then turned to the vitrectomy portion of the procedure.  A 25 gauge trocar was placed in the inferotemporal quadrant in a beveled fashion. A 4 mm infusion cannula was placed through this trocar, and the infusion cannula was confirmed in the vitreous cavity with no incarceration of retina or choroid prior to turning it on. Two additional 25 gauge trocars were placed in the superonasal and superotemporal quadrants (2 and 10 oclock, respectively) in a similar beveled fashion. A standard three-port pars plana vitrectomy was performed using the light pipe, the cutter, and the BIOM viewing system. A thorough core and peripheral vitreous dissection was performed. A posterior vitreous detachment was confirmed over the optic nerve.There was a superior bullous retinal detachment from 1030-1200 oclock with the macula attached. Within the detached retina, there was a large ragged horseshoe tear from 1030-11, a linear tear at 1130, and  lattice degeneration. Another large ragged retinal tear  was noted at the 0900 oclock position, but the surrounding retina remained attached.   Traction was removed from all retinal breaks. The breaks were trimmed using the cutter to smooth the edges. A complete fluid-air exchange was performed with a soft tip extrusion cannula over the superior breaks. An endolaser probe was brought onto the field and was used to apply photocoagulation around all the retinal breaks and 360 panretinal photocoagulation over and just posterior to the scleral buckle. After completion of these maneuvers, the retina was noted to be flat. Additional endolaser was applied to the anterior peripheral retina.  At this time, the buckle height was confirmed and the buckle was finalized by trimming the band ends. The superotemporal trocar was removed and sutured with 7-0 vicryl in an interrupted fashion. A complete air to 14% C3F8 gas exchange was performed through the infusion cannula and vented through the superonasal trocar using the extrusion cannula.The superonasal trocar and infusion cannula and associated trocar were then removed and sutured with 7-0 vicryl in an interrupted fashion. A subtenon's block containing 0.75% marcaine and 2% lidocaine was administered. Kefzol + polymixin irrigation was then used over the buckle.  The conjunctiva was closed with 7-0 vicryl sutures. The eye was confirmed to be at a physiologic level by digital palpation. Subconjunctival injections of Antibiotic and kenalog were administered. The lid speculum and drapes were removed. Drops of an antibiotic, steroid were given. The eye was patched and shielded. The patient tolerated the procedure well without any intraoperative or immediate postoperative complications. The patient was taken to the recovery room in good condition. The patient was instructed to maintain a strict face-down position and will be seen by Dr. Vanessa BarbaraZamora in clinic, tomorrow morning.

## 2017-08-09 NOTE — ED Triage Notes (Signed)
Pt reports having worsening development of right eye having a black area on lower portion of eye. Pt reports initial symptoms started on Wednesday but progressed last night.

## 2017-08-09 NOTE — Anesthesia Procedure Notes (Signed)
Procedure Name: Intubation Date/Time: 08/09/2017 12:51 PM Performed by: Cleda Daub, CRNA Pre-anesthesia Checklist: Patient identified, Emergency Drugs available, Suction available and Patient being monitored Patient Re-evaluated:Patient Re-evaluated prior to induction Oxygen Delivery Method: Circle system utilized Preoxygenation: Pre-oxygenation with 100% oxygen Induction Type: IV induction Ventilation: Mask ventilation without difficulty and Mask ventilation throughout procedure Laryngoscope Size: Mac and 3 Grade View: Grade I Tube type: Oral Tube size: 7.0 mm Number of attempts: 1 Airway Equipment and Method: Stylet Placement Confirmation: ETT inserted through vocal cords under direct vision,  positive ETCO2 and breath sounds checked- equal and bilateral Secured at: 21 cm Tube secured with: Tape Dental Injury: Teeth and Oropharynx as per pre-operative assessment

## 2017-08-09 NOTE — H&P (Signed)
Gail Duncan is an 52 y.o. female.    Chief Complaint: inf nasal scotoma, right eye  HPI: Pt presents from ED with 3 day history of floaters and inf nasal scotoma right eye. On exam, found to have a macula-sparing retinal detachment right eye with multiple retinal tears. Left eye with lattice degeneration and atrophic retinal breaks.  Past Medical History:  Diagnosis Date  . Anemia   . Hearing loss in right ear    Unknown cause, had full work up with Specialist in [place]  . History of chicken pox     Past Surgical History:  Procedure Laterality Date  . ABDOMINAL EXPLORATION SURGERY    . ABLATION    . DENTAL SURGERY    . EXPLORATORY LAPAROTOMY     "fibrous"    Family History  Problem Relation Age of Onset  . Diabetes Mother   . Prostate cancer Father   . Hypertension Father   . Retinal detachment Father   . Lung cancer Paternal Grandfather   . Amblyopia Neg Hx   . Blindness Neg Hx   . Cataracts Neg Hx   . Glaucoma Neg Hx   . Macular degeneration Neg Hx   . Strabismus Neg Hx   . Retinitis pigmentosa Neg Hx    Social History:  reports that she has never smoked. She has never used smokeless tobacco. She reports that she drinks alcohol. She reports that she does not use drugs.  Allergies:  Allergies  Allergen Reactions  . Asa [Aspirin] Other (See Comments)    Contraindicated due to medical condition    No medications prior to admission.    Review of systems otherwise negative  Last menstrual period 12/22/2014.  Physical exam: Mental status: oriented x3. Eyes: See eye exam associated with this date of surgery Ears, Nose, Throat: within normal limits Neck: Within Normal limits General: within normal limits Chest: Within normal limits Breast: deferred Heart: Within normal limits Abdomen: Within normal limits GU: deferred Extremities: within normal limits Skin: within normal limits  Assessment/Plan 1. Retinal detachment with multiple retinal breaks, OD -  mac-on superotemporal detachment OD with multiple breaks  2. Lattice degeneration w/ atrophic retinal breaks OS - asymptomatic  Plan: To [facility] for scleral buckle procedure + 25g PPV with endolaser and gas OD, and laser retinopexy OS under general anesthesia    Gardiner Sleeper, M.D., Ph.D. Vitreoretinal Surgeon [facility]

## 2017-08-09 NOTE — Anesthesia Preprocedure Evaluation (Signed)
Anesthesia Evaluation  Patient identified by MRN, date of birth, ID band Patient awake    Reviewed: Allergy & Precautions, NPO status , Patient's Chart, lab work & pertinent test results  Airway Mallampati: II  TM Distance: >3 FB Neck ROM: Full    Dental no notable dental hx.    Pulmonary neg pulmonary ROS,    Pulmonary exam normal breath sounds clear to auscultation       Cardiovascular negative cardio ROS Normal cardiovascular exam Rhythm:Regular Rate:Normal     Neuro/Psych negative neurological ROS  negative psych ROS   GI/Hepatic negative GI ROS, Neg liver ROS,   Endo/Other  negative endocrine ROS  Renal/GU negative Renal ROS  negative genitourinary   Musculoskeletal negative musculoskeletal ROS (+)   Abdominal   Peds negative pediatric ROS (+)  Hematology negative hematology ROS (+)   Anesthesia Other Findings   Reproductive/Obstetrics negative OB ROS                             Anesthesia Physical Anesthesia Plan  ASA: II  Anesthesia Plan: General   Post-op Pain Management:    Induction: Intravenous  PONV Risk Score and Plan: 3 and Ondansetron, Dexamethasone and Treatment may vary due to age or medical condition  Airway Management Planned: Oral ETT  Additional Equipment:   Intra-op Plan:   Post-operative Plan: Extubation in OR  Informed Consent: I have reviewed the patients History and Physical, chart, labs and discussed the procedure including the risks, benefits and alternatives for the proposed anesthesia with the patient or authorized representative who has indicated his/her understanding and acceptance.     Dental advisory given  Plan Discussed with: CRNA and Surgeon  Anesthesia Plan Comments:         Anesthesia Quick Evaluation  

## 2017-08-09 NOTE — Brief Op Note (Signed)
08/09/2017  4:25 PM  PATIENT:  Gail Duncan  52 y.o. female  PRE-OPERATIVE DIAGNOSIS:  mac-on retinal detachment  POST-OPERATIVE DIAGNOSIS:  mac-on retinal detachment  PROCEDURE:  Procedure(s): SCLERAL BUCKLE WITH 25 GAUGE PARS PLANA VITRECTOMY AND ENDOLASER (Right) LASER PHOTO ABLATION (Left) INSERTION OF GAS (Right)  SURGEON:  Surgeon(s) and Role:    Bernarda Caffey, MD - Primary  ASSISTANTS: Catha Brow, COA   ANESTHESIA:   local and general  EBL:  minimal   BLOOD ADMINISTERED:none  DRAINS: none   LOCAL MEDICATIONS USED:  BUPIVICAINE  and XYLOCAINE   SPECIMEN:  No Specimen  DISPOSITION OF SPECIMEN:  N/A  COUNTS:  YES  TOURNIQUET:  * No tourniquets in log *  DICTATION: .Note written in EPIC  PLAN OF CARE: Discharge to home after PACU  PATIENT DISPOSITION:  PACU - hemodynamically stable.   Delay start of Pharmacological VTE agent (>24hrs) due to surgical blood loss or risk of bleeding: not applicable

## 2017-08-09 NOTE — ED Notes (Signed)
Patient transported to MRI 

## 2017-08-09 NOTE — Transfer of Care (Signed)
Immediate Anesthesia Transfer of Care Note  Patient: Gail Duncan  Procedure(s) Performed: SCLERAL BUCKLE WITH 25 GAUGE PARS PLANA VITRECTOMY AND ENDOLASER (Right Eye) LASER PHOTO ABLATION (Left Eye) INSERTION OF GAS (Right Eye)  Patient Location: PACU  Anesthesia Type:General  Level of Consciousness: awake, alert , oriented and patient cooperative  Airway & Oxygen Therapy: Patient Spontanous Breathing and Patient connected to face mask oxygen  Post-op Assessment: Report given to RN and Post -op Vital signs reviewed and stable  Post vital signs: Reviewed and stable  Last Vitals:  Vitals Value Taken Time  BP 143/86 08/09/2017  4:22 PM  Temp 36.4 C 08/09/2017  4:22 PM  Pulse 106 08/09/2017  4:27 PM  Resp 12 08/09/2017  4:27 PM  SpO2 97 % 08/09/2017  4:27 PM  Vitals shown include unvalidated device data.  Last Pain:  Vitals:   08/09/17 1622  TempSrc:   PainSc: (P) 0-No pain      Patients Stated Pain Goal: 3 (08/09/17 1221)  Complications: No apparent anesthesia complications

## 2017-08-09 NOTE — Progress Notes (Signed)
[facility] Note  08/09/2017     CHIEF COMPLAINT Patient presents for Retina Evaluation   HISTORY OF PRESENT ILLNESS: Gail Duncan is a 52 y.o. female who presents to the clinic today for:   HPI    Retina Evaluation    In right eye.  Duration of 7 hours.  Associated Symptoms Floaters and Pain.  Negative for Flashes, Blind Spot, Photophobia, Scalp Tenderness, Fever, Weight Loss, Jaw Claudication, Glare, Fatigue, Shoulder/Hip pain, Trauma, Redness and Distortion.  Context:  distance vision, mid-range vision and near vision.  Treatments tried include no treatments.  I, the attending physician,  performed the HPI with the patient and updated documentation appropriately.          Comments    Pt presents on the referral of the ED for concern of RD OD; Pt states she went to ED at 2 am bc she was afraid she was having a RD, pt states from 0600-0900 there is a dark, shadowy area that was all black earlier this morning, pt states when she looked up the area turned red, but that is gone now, pt states when she looks down she cannot see it, pt states this started Thursday night when she thought she saw black floaters that would move whenever her eye moved, pt states they were not like regular floaters, pt states Friday it was like a shadow on her nose and she planned to see regular eye dr this morning, pt denies flashes or wavy vision, but states eye feels swollen       Last edited by Bernarda Caffey, MD on 08/09/2017 11:16 AM. (History)    Pt states on Thursday she was having floaters that were "really dark"; Pt states she began to have a shadow nasally on Friday; Pt states there is a pressure feeling; Pt states she went to the ED at 3:30 this morning; Pt states she sees Dr. Gwynn Burly in [place];  Referring physician: Ma Hillock, DO [address] [place], [place] [address]  HISTORICAL INFORMATION:   Selected notes from the MEDICAL RECORD NUMBER Referred by ED for  possible RD   CURRENT MEDICATIONS: No current outpatient medications on file. (Ophthalmic Drugs)   No current facility-administered medications for this visit.  (Ophthalmic Drugs)   No current outpatient medications on file. (Other)   No current facility-administered medications for this visit.  (Other)      REVIEW OF SYSTEMS: ROS    Positive for: Eyes   Negative for: Constitutional, Gastrointestinal, Neurological, Skin, Genitourinary, Musculoskeletal, HENT, Endocrine, Cardiovascular, Respiratory, Psychiatric, Allergic/Imm, Heme/Lymph   Last edited by Cherrie Gauze, COA on 08/09/2017  9:01 AM. (History)       ALLERGIES Allergies  Allergen Reactions  . Asa [Aspirin] Other (See Comments)    Contraindicated due to medical condition    PAST MEDICAL HISTORY Past Medical History:  Diagnosis Date  . Anemia   . Hearing loss in right ear    Unknown cause, had full work up with Specialist in [place]  . History of chicken pox    Past Surgical History:  Procedure Laterality Date  . ABDOMINAL EXPLORATION SURGERY    . ABLATION    . DENTAL SURGERY    . EXPLORATORY LAPAROTOMY     "fibrous"    FAMILY HISTORY Family History  Problem Relation Age of Onset  . Diabetes Mother   . Prostate cancer Father   . Hypertension Father   . Retinal detachment Father   .  Lung cancer Paternal Grandfather   . Amblyopia Neg Hx   . Blindness Neg Hx   . Cataracts Neg Hx   . Glaucoma Neg Hx   . Macular degeneration Neg Hx   . Strabismus Neg Hx   . Retinitis pigmentosa Neg Hx     SOCIAL HISTORY Social History   Tobacco Use  . Smoking status: Never Smoker  . Smokeless tobacco: Never Used  Substance Use Topics  . Alcohol use: Yes    Alcohol/week: 0.0 oz    Comment: occasional social use  . Drug use: No         OPHTHALMIC EXAM:  Base Eye Exam    Visual Acuity (Snellen - Linear)      Right Left   Dist cc 20/40 20/40 +1   Dist ph cc 20/25 -1 20/25 -2   Correction:  Glasses        Tonometry (Tonopen, 9:09 AM)      Right Left   Pressure 14 17       Pupils      Dark Light Shape React APD   Right 6 3 Round Brisk None   Left 6 3 Round Brisk None       Visual Fields (Counting fingers)      Left Right    Full Full       Extraocular Movement      Right Left    Full, Ortho Full, Ortho       Neuro/Psych    Oriented x3:  Yes   Mood/Affect:  Normal       Dilation    Both eyes:  1.0% Mydriacyl, 2.5% Phenylephrine @ 9:09 AM        Slit Lamp and Fundus Exam    Slit Lamp Exam      Right Left   Lids/Lashes Normal Normal   Conjunctiva/Sclera White and quiet White and quiet   Cornea 1+ Punctate epithelial erosions 1+ Punctate epithelial erosions   Anterior Chamber Deep and quiet Deep and quiet   Iris Round and well dilated Round and well dilated   Lens 2+ Nuclear sclerosis, 2+ Cortical cataract 2+ Nuclear sclerosis, 2+ Cortical cataract   Vitreous Vitreous syneresis, +tobacco dust, Posterior vitreous detachment, vitreous debris inferiorly Vitreous syneresis, Posterior vitreous detachment       Fundus Exam      Right Left   Disc Pink and Sharp, mildly Tilted disc Pink and Sharp, mildly Tilted disc   C/D Ratio 0.4 0.4   Macula Flat, mild Retinal pigment epithelial mottling, No heme or edema Flat, mild Retinal pigment epithelial mottling, No heme or edema   Vessels Mild Vascular attenuation Normal   Periphery Focal bullous RRD from 1030 to 1200 with lattice, large horseshoe tear 1030-11, Linear retinal tear at 1130, large ragged horseshoe tear from 0800 to 0900 with mild SRF Attached, pigmented Lattice degeneration with atrophic holes at 0430; lattice at 1200 with atrophic break          IMAGING AND PROCEDURES  Imaging and Procedures for _0 @  OCT, Retina - OU - Both Eyes       Right Eye Quality was good. Central Foveal Thickness: 285. Progression has no prior data. Findings include normal foveal contour, no IRF, subretinal fluid (+SRF caught  on wide field).   Left Eye Quality was good. Central Foveal Thickness: 289. Progression has no prior data. Findings include normal foveal contour, no IRF, no SRF.   Notes *Images captured and stored on drive  Diagnosis / Impression:  OD: focal SRF ST quadrant; macula -- NFP, No IRF/SRF  OS: NFP, No IRF/SRF  Clinical management:  See below  Abbreviations: NFP - Normal foveal profile. CME - cystoid macular edema. PED - pigment epithelial detachment. IRF - intraretinal fluid. SRF - subretinal fluid. EZ - ellipsoid zone. ERM - epiretinal membrane. ORA - outer retinal atrophy. ORT - outer retinal tubulation. SRHM - subretinal hyper-reflective material         CT Head Wo Contrast       CLINICAL DATA:  Focal neurologic deficit greater than 6 hours. Stroke suspected. Right vision changes.  EXAM: CT HEAD WITHOUT CONTRAST  TECHNIQUE: Contiguous axial images were obtained from the base of the skull through the vertex without intravenous contrast.  COMPARISON:  None.  FINDINGS: Brain: No intracranial hemorrhage, mass effect, or midline shift. No hydrocephalus. The basilar cisterns are patent. No evidence of territorial infarct or acute ischemia. No extra-axial or intracranial fluid collection.  Vascular: No hyperdense vessel or unexpected calcification.  Skull: No fracture or focal lesion.  Sinuses/Orbits: Paranasal sinuses and mastoid air cells are clear. The visualized orbits are unremarkable.  Other: None.  IMPRESSION: No acute intracranial abnormality.   Electronically Signed   By: Jeb Levering M.D.   On: 08/09/2017 06:03        CBC with Differential     Component Value Flag Ref Range Units Status   WBC 5.5    4.0 - 10.5 K/uL Final   RBC 4.43    3.87 - 5.11 MIL/uL Final   Hemoglobin 13.1    12.0 - 15.0 g/dL Final   HCT 39.3    36.0 - 46.0 % Final   MCV 88.7    78.0 - 100.0 fL Final   MCH 29.6    26.0 - 34.0 pg Final   MCHC 33.3    30.0 - 36.0 g/dL  Final   RDW 12.7    11.5 - 15.5 % Final   Platelets 228    150 - 400 K/uL Final   Neutrophils Relative % 76     % Final   Neutro Abs 4.1    1.7 - 7.7 K/uL Final   Lymphocytes Relative 21     % Final   Lymphs Abs 1.2    0.7 - 4.0 K/uL Final   Monocytes Relative 3     % Final   Monocytes Absolute 0.2    0.1 - 1.0 K/uL Final   Eosinophils Relative 0     % Final   Eosinophils Absolute 0.0    0.0 - 0.7 K/uL Final   Basophils Relative 0     % Final   Basophils Absolute 0.0    0.0 - 0.1 K/uL Final   Comment:   Performed at [facility], [place] [address], [place], [place] [address]        Basic metabolic panel     Component Value Flag Ref Range Units Status   Sodium 144    135 - 145 mmol/L Final   Potassium 4.2    3.5 - 5.1 mmol/L Final   Chloride 108    101 - 111 mmol/L Final   CO2 28    22 - 32 mmol/L Final   Glucose, Bld 103    65 - 99 mg/dL Final   BUN 14    6 - 20 mg/dL Final   Creatinine, Ser 0.78    0.44 - 1.00 mg/dL Final  Calcium 9.6    8.9 - 10.3 mg/dL Final   GFR calc non Af Amer >60    >60 mL/min Final   GFR calc Af Amer >60    >60 mL/min Final   Comment:   (NOTE) The eGFR has been calculated using the CKD EPI equation. This calculation has not been validated in all clinical situations. eGFR's persistently <60 mL/min signify possible Chronic Kidney Disease.    Anion gap 8    5 - 15  Final   Comment:   Performed at [facility], [place] [address] [place], [place] [address]        Protime-INR     Component Value Flag Ref Range Units Status   Prothrombin Time 12.3    11.4 - 15.2 seconds Final   INR 0.92      Final   Comment:   Performed at [facility], [place] [address], [place], [place] [address]        MR Brain W and Wo Contrast       CLINICAL DATA:  Right eye vision changes.  EXAM: MRI HEAD AND ORBITS WITHOUT AND WITH CONTRAST  MRA HEAD WITHOUT CONTRAST  TECHNIQUE: Multiplanar, multiecho pulse  sequences of the brain and surrounding structures were obtained without and with intravenous contrast. Multiplanar, multiecho pulse sequences of the orbits and surrounding structures were obtained including fat saturation techniques, before and after intravenous contrast administration. Angiographic images of the head were obtained using MRA technique without contrast.  CONTRAST:  55m MULTIHANCE GADOBENATE DIMEGLUMINE 529 MG/ML IV SOLN  COMPARISON:  Head CT 08/09/2017  FINDINGS: MRI HEAD FINDINGS  Brain: There is no evidence of acute infarct, intracranial hemorrhage, mass, midline shift, or extra-axial fluid collection. The ventricles and sulci are normal. The brain is normal in signal. No abnormal enhancement is identified.  Vascular: Major intracranial vascular flow voids are preserved.  Skull and upper cervical spine: Unremarkable bone marrow signal.  Other: Scattered small benign-appearing scalp nodules measuring up to 1 cm in size, possibly sebaceous cysts.  MRI ORBITS FINDINGS  Orbits: The globes appear intact. The optic nerve complexes are unremarkable. No orbital mass or inflammation is identified. The extraocular muscles and lacrimal glands are symmetric and normal in appearance.  Visualized sinuses: The paranasal sinuses are clear. Trapped fluid or a small mucocele is noted in the left petrous apex without associated enhancement.  Soft tissues: None.  Limited intracranial: Unremarkable pituitary gland, optic chiasm, and cavernous sinuses.  MRA HEAD FINDINGS  The visualized distal vertebral arteries are widely patent to the basilar and codominant. Visualized cerebellar branches are unremarkable. The basilar artery is widely patent. Posterior communicating arteries are not identified and may be diminutive or absent. PCAs are patent without evidence of significant stenosis.  The internal carotid arteries are widely patent from skull base to carotid termini.  ACAs and MCAs are patent without evidence of proximal branch occlusion or significant proximal stenosis. No aneurysm is identified.  IMPRESSION: 1. Unremarkable appearance of the brain and orbits. No acute intracranial abnormality. 2. Left petrous apex fluid/small mucocele. 3. Negative head MRA.   Electronically Signed   By: ALogan BoresM.D.   On: 08/09/2017 07:43        MR MRA HEAD WO CONTRAST       CLINICAL DATA:  Right eye vision changes.  EXAM: MRI HEAD AND ORBITS WITHOUT AND WITH CONTRAST  MRA HEAD WITHOUT CONTRAST  TECHNIQUE: Multiplanar, multiecho pulse sequences of the brain and surrounding  structures were obtained without and with intravenous contrast. Multiplanar, multiecho pulse sequences of the orbits and surrounding structures were obtained including fat saturation techniques, before and after intravenous contrast administration. Angiographic images of the head were obtained using MRA technique without contrast.  CONTRAST:  72m MULTIHANCE GADOBENATE DIMEGLUMINE 529 MG/ML IV SOLN  COMPARISON:  Head CT 08/09/2017  FINDINGS: MRI HEAD FINDINGS  Brain: There is no evidence of acute infarct, intracranial hemorrhage, mass, midline shift, or extra-axial fluid collection. The ventricles and sulci are normal. The brain is normal in signal. No abnormal enhancement is identified.  Vascular: Major intracranial vascular flow voids are preserved.  Skull and upper cervical spine: Unremarkable bone marrow signal.  Other: Scattered small benign-appearing scalp nodules measuring up to 1 cm in size, possibly sebaceous cysts.  MRI ORBITS FINDINGS  Orbits: The globes appear intact. The optic nerve complexes are unremarkable. No orbital mass or inflammation is identified. The extraocular muscles and lacrimal glands are symmetric and normal in appearance.  Visualized sinuses: The paranasal sinuses are clear. Trapped fluid or a small mucocele is noted in the left  petrous apex without associated enhancement.  Soft tissues: None.  Limited intracranial: Unremarkable pituitary gland, optic chiasm, and cavernous sinuses.  MRA HEAD FINDINGS  The visualized distal vertebral arteries are widely patent to the basilar and codominant. Visualized cerebellar branches are unremarkable. The basilar artery is widely patent. Posterior communicating arteries are not identified and may be diminutive or absent. PCAs are patent without evidence of significant stenosis.  The internal carotid arteries are widely patent from skull base to carotid termini. ACAs and MCAs are patent without evidence of proximal branch occlusion or significant proximal stenosis. No aneurysm is identified.  IMPRESSION: 1. Unremarkable appearance of the brain and orbits. No acute intracranial abnormality. 2. Left petrous apex fluid/small mucocele. 3. Negative head MRA.   Electronically Signed   By: ALogan BoresM.D.   On: 08/09/2017 07:43        MR ORBITS W WO CONTRAST       CLINICAL DATA:  Right eye vision changes.  EXAM: MRI HEAD AND ORBITS WITHOUT AND WITH CONTRAST  MRA HEAD WITHOUT CONTRAST  TECHNIQUE: Multiplanar, multiecho pulse sequences of the brain and surrounding structures were obtained without and with intravenous contrast. Multiplanar, multiecho pulse sequences of the orbits and surrounding structures were obtained including fat saturation techniques, before and after intravenous contrast administration. Angiographic images of the head were obtained using MRA technique without contrast.  CONTRAST:  155mMULTIHANCE GADOBENATE DIMEGLUMINE 529 MG/ML IV SOLN  COMPARISON:  Head CT 08/09/2017  FINDINGS: MRI HEAD FINDINGS  Brain: There is no evidence of acute infarct, intracranial hemorrhage, mass, midline shift, or extra-axial fluid collection. The ventricles and sulci are normal. The brain is normal in signal. No abnormal enhancement is  identified.  Vascular: Major intracranial vascular flow voids are preserved.  Skull and upper cervical spine: Unremarkable bone marrow signal.  Other: Scattered small benign-appearing scalp nodules measuring up to 1 cm in size, possibly sebaceous cysts.  MRI ORBITS FINDINGS  Orbits: The globes appear intact. The optic nerve complexes are unremarkable. No orbital mass or inflammation is identified. The extraocular muscles and lacrimal glands are symmetric and normal in appearance.  Visualized sinuses: The paranasal sinuses are clear. Trapped fluid or a small mucocele is noted in the left petrous apex without associated enhancement.  Soft tissues: None.  Limited intracranial: Unremarkable pituitary gland, optic chiasm, and cavernous sinuses.  MRA HEAD FINDINGS  The visualized  distal vertebral arteries are widely patent to the basilar and codominant. Visualized cerebellar branches are unremarkable. The basilar artery is widely patent. Posterior communicating arteries are not identified and may be diminutive or absent. PCAs are patent without evidence of significant stenosis.  The internal carotid arteries are widely patent from skull base to carotid termini. ACAs and MCAs are patent without evidence of proximal branch occlusion or significant proximal stenosis. No aneurysm is identified.  IMPRESSION: 1. Unremarkable appearance of the brain and orbits. No acute intracranial abnormality. 2. Left petrous apex fluid/small mucocele. 3. Negative head MRA.   Electronically Signed   By: Logan Bores M.D.   On: 08/09/2017 07:43                 ASSESSMENT/PLAN:    ICD-10-CM   1. 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 Retinal edema H35.81 OCT, Retina - OU - Both Eyes  5. Posterior vitreous detachment of both eyes H43.813   6. Combined forms of age-related  cataract of both eyes H25.813     1. Rhegmatogenous retinal detachment, RIGHT eye - bullous superotemporal mac on detachment, onset of foveal involvement Saturday, 01/30/17 by history - detached from 1030-1200 oclock, large horseshoe tear from 1030-1100 and linear tear at 1130 within bed of lattice - The incidence, risk factors, and natural history of retinal detachment was discussed with patient.  Potential treatment options including delimiting laser, pneumatic retinopexy, scleral buckle, and vitrectomy, cryotherapy and laser, and the use of air, gas, and oil discussed with patient.  The risks of blindness, loss of vision, infection, hemorrhage, cataract progression or lens displacement were discussed with patient. - recommend SBP + 25g PPV/EL/Gas OD under general anesthesia today - pt wishes to proceed with surgery - RBA of procedure discussed, questions answered - informed consent obtained and signed - case scheduled for 1230 -- MC OR 8 - f/u POD1  2,3. Lattice degeneration OU;  w/ atrophic breaks left eye - OD - superotemporal lattice at site of breaks and detachment - OS - patches of lattice at 12 and 430 with a single atrophic breaks at each site - discussed findings, prognosis, and treatment options including observation - recommend laser retinopexy OS at time of surgery OD - wil start PF QID OS x7 days tomorrow  4. No retinal edema on exam or OCT  5. PVD / vitreous syneresis OU  Discussed findings and prognosis  RTs and RD as above  6. Combined form age-related cataract OU-  - The symptoms of cataract, surgical options, and treatments and risks were discussed with patient. - discussed diagnosis and likely progression following PPV OD - monitor for now   Ophthalmic Meds Ordered this visit:  No orders of the defined types were placed in this encounter.      Return in about 1 day (around 08/10/2017) for POV.  There are no Patient Instructions on file for this  visit.   Explained the diagnoses, plan, and follow up with the patient and they expressed understanding.  Patient expressed understanding of the importance of proper follow up care.   This document serves as a record of services personally performed by Gardiner Sleeper, MD, PhD. It was created on their behalf by Catha Brow, [place], a certified ophthalmic assistant. The creation of this record is the provider's dictation and/or activities during the visit.  Electronically signed by:  Catha Brow, COA  06.10.19 11:42 AM    Gardiner Sleeper, M.D., Ph.D. Diseases & Surgery of the Retina and Vitreous [facility]  I have reviewed the above documentation for accuracy and completeness, and I agree with the above. Gardiner Sleeper, M.D., Ph.D. 08/09/17 11:54 AM     Abbreviations: M myopia (nearsighted); A astigmatism; H hyperopia (farsighted); P presbyopia; Mrx spectacle prescription;  CTL contact lenses; OD right eye; OS left eye; OU both eyes  XT exotropia; ET esotropia; PEK punctate epithelial keratitis; PEE punctate epithelial erosions; DES dry eye syndrome; MGD meibomian gland dysfunction; ATs artificial tears; PFAT's preservative free artificial tears; [place] nuclear sclerotic cataract; PSC posterior subcapsular cataract; ERM epi-retinal membrane; PVD posterior vitreous detachment; RD retinal detachment; DM diabetes mellitus; DR diabetic retinopathy; NPDR non-proliferative diabetic retinopathy; PDR proliferative diabetic retinopathy; CSME clinically significant macular edema; DME diabetic macular edema; dbh dot blot hemorrhages; CWS cotton wool spot; POAG primary open angle glaucoma; C/D cup-to-disc ratio; HVF humphrey visual field; GVF goldmann visual field; OCT optical coherence tomography; IOP intraocular pressure; BRVO Branch retinal vein occlusion; CRVO central retinal vein occlusion; CRAO central retinal artery occlusion; BRAO branch retinal artery occlusion; RT retinal  tear; SB scleral buckle; PPV pars plana vitrectomy; VH Vitreous hemorrhage; PRP panretinal laser photocoagulation; IVK intravitreal kenalog; VMT vitreomacular traction; MH Macular hole;  NVD neovascularization of the disc; NVE neovascularization elsewhere; AREDS age related eye disease study; ARMD age related macular degeneration; POAG primary open angle glaucoma; EBMD epithelial/anterior basement membrane dystrophy; ACIOL anterior chamber intraocular lens; IOL intraocular lens; PCIOL posterior chamber intraocular lens; Phaco/IOL phacoemulsification with intraocular lens placement; [place] photorefractive keratectomy; LASIK laser assisted in situ keratomileusis; HTN hypertension; DM diabetes mellitus; COPD chronic obstructive pulmonary disease

## 2017-08-10 ENCOUNTER — Encounter (INDEPENDENT_AMBULATORY_CARE_PROVIDER_SITE_OTHER): Payer: Self-pay | Admitting: Ophthalmology

## 2017-08-10 ENCOUNTER — Ambulatory Visit (INDEPENDENT_AMBULATORY_CARE_PROVIDER_SITE_OTHER): Payer: Federal, State, Local not specified - PPO | Admitting: Ophthalmology

## 2017-08-10 DIAGNOSIS — H33332 Multiple defects of retina without detachment, left eye: Secondary | ICD-10-CM

## 2017-08-10 DIAGNOSIS — H3581 Retinal edema: Secondary | ICD-10-CM

## 2017-08-10 DIAGNOSIS — H43813 Vitreous degeneration, bilateral: Secondary | ICD-10-CM

## 2017-08-10 DIAGNOSIS — H25813 Combined forms of age-related cataract, bilateral: Secondary | ICD-10-CM

## 2017-08-10 DIAGNOSIS — H35413 Lattice degeneration of retina, bilateral: Secondary | ICD-10-CM

## 2017-08-10 DIAGNOSIS — H3321 Serous retinal detachment, right eye: Secondary | ICD-10-CM

## 2017-08-10 NOTE — Anesthesia Postprocedure Evaluation (Signed)
Anesthesia Post Note  Patient: Gail Duncan  Procedure(s) Performed: SCLERAL BUCKLE WITH 25 GAUGE PARS PLANA VITRECTOMY AND ENDOLASER (Right Eye) LASER PHOTO ABLATION (Left Eye) INSERTION OF GAS (Right Eye)     Patient location during evaluation: PACU Anesthesia Type: General Level of consciousness: awake and sedated Pain management: pain level controlled Vital Signs Assessment: post-procedure vital signs reviewed and stable Respiratory status: spontaneous breathing, nonlabored ventilation, respiratory function stable and patient connected to nasal cannula oxygen Cardiovascular status: blood pressure returned to baseline and stable Postop Assessment: no apparent nausea or vomiting Anesthetic complications: no    Last Vitals:  Vitals:   08/09/17 1730 08/09/17 1818  BP: 140/89 (!) 143/89  Pulse: 69 78  Resp: 14 14  Temp: (!) 36.3 C (!) 36.3 C  SpO2: 98% 98%    Last Pain:  Vitals:   08/09/17 1730  TempSrc:   PainSc: Asleep                 Alise Calais,JAMES TERRILL

## 2017-08-12 NOTE — Progress Notes (Signed)
[facility] Note  08/13/2017     CHIEF COMPLAINT Patient presents for Post-op Follow-up   HISTORY OF PRESENT ILLNESS: Gail Duncan is a 52 y.o. female who presents to the clinic today for:   HPI    Post-op Follow-up    In right eye.  Discomfort includes none.  Negative for pain, itching, foreign body sensation, tearing, discharge and floaters.  Vision is stable.          Comments    POV 2 S/P 25g [place] vit. W/fluid-air exch. Lannette Donath /14% C3F8gas OD ;Laser OS (08/09/17). Patient states she can distinguish colors, she also can see her hand, she denies eye pain. Pt is using gtt's as instructed       Last edited by Cherrie Gauze, COA on 08/13/2017 10:04 AM. (History)      Referring physician: Ma Hillock, DO [address] [place], [place] [address]  HISTORICAL INFORMATION:   Selected notes from the MEDICAL RECORD NUMBER Referred by ED for possible RD   CURRENT MEDICATIONS: Current Outpatient Medications (Ophthalmic Drugs)  Medication Sig  . prednisoLONE acetate (PRED FORTE) 1 % ophthalmic suspension Place 1 drop into the right eye 6 (six) times daily.   No current facility-administered medications for this visit.  (Ophthalmic Drugs)   Current Outpatient Medications (Other)  Medication Sig  . ANUCORT-HC 25 MG suppository Place 25 mg rectally 2 (two) times daily as needed for hemorrhoids or anal itching.   Marland Kitchen BIOTIN PO Take 1 capsule by mouth daily.  . Cholecalciferol (VITAMIN D3) 5000 units CAPS Take 5,000 capsules by mouth 3 (three) times a week.   . Coenzyme Q10-Vitamin E (COQ10-VITAMIN E) 100-10 MG-UNIT CAPS Take 1 capsule by mouth daily.   . Flaxseed, Linseed, (FLAXSEED OIL PO) Take 15 mLs by mouth daily.   Marland Kitchen HYDROcodone-acetaminophen (NORCO/VICODIN) 5-325 MG tablet Take 1 tablet by mouth every 4 (four) hours as needed for moderate pain.  . pantoprazole (PROTONIX) 40 MG tablet Take 40 mg by mouth 2 (two) times daily.   No  current facility-administered medications for this visit.  (Other)      REVIEW OF SYSTEMS: ROS    Positive for: Eyes   Negative for: Constitutional, Gastrointestinal, Neurological, Skin, Genitourinary, Musculoskeletal, HENT, Endocrine, Cardiovascular, Respiratory, Psychiatric, Allergic/Imm, Heme/Lymph   Last edited by Zenovia Jordan, LPN on 6/46/8032  1:22 AM. (History)       ALLERGIES Allergies  Allergen Reactions  . Asa [Aspirin] Other (See Comments)    Contraindicated due to medical condition    PAST MEDICAL HISTORY Past Medical History:  Diagnosis Date  . Anemia   . GERD (gastroesophageal reflux disease)   . Hearing loss in right ear    Unknown cause, had full work up with Specialist in [place]  . History of chicken pox    Past Surgical History:  Procedure Laterality Date  . ABDOMINAL EXPLORATION SURGERY    . ABLATION    . DENTAL SURGERY    . EXPLORATORY LAPAROTOMY     "fibrous"  . GAS INSERTION Right 08/09/2017   Procedure: INSERTION OF GAS;  Surgeon: Bernarda Caffey, MD;  Location: [facility];  Service: Ophthalmology;  Laterality: Right;  . LASER PHOTO ABLATION Left 08/09/2017   Procedure: LASER PHOTO ABLATION;  Surgeon: Bernarda Caffey, MD;  Location: [facility];  Service: Ophthalmology;  Laterality: Left;  . SCLERAL BUCKLE WITH POSSIBLE 25 GAUGE PARS PLANA VITRECTOMY Right 08/09/2017   Procedure: SCLERAL BUCKLE WITH 25 GAUGE  PARS PLANA VITRECTOMY AND ENDOLASER;  Surgeon: Bernarda Caffey, MD;  Location: [facility];  Service: Ophthalmology;  Laterality: Right;    FAMILY HISTORY Family History  Problem Relation Age of Onset  . Diabetes Mother   . Prostate cancer Father   . Hypertension Father   . Retinal detachment Father   . Lung cancer Paternal Grandfather   . Amblyopia Neg Hx   . Blindness Neg Hx   . Cataracts Neg Hx   . Glaucoma Neg Hx   . Macular degeneration Neg Hx   . Strabismus Neg Hx   . Retinitis pigmentosa Neg Hx     SOCIAL HISTORY Social History   Tobacco Use   . Smoking status: Never Smoker  . Smokeless tobacco: Never Used  Substance Use Topics  . Alcohol use: Yes    Alcohol/week: 0.0 oz    Comment: occasional social use  . Drug use: No         OPHTHALMIC EXAM:  Base Eye Exam    Visual Acuity (Snellen - Linear)      Right Left   Dist cc HM 20/50 +2   Dist ph cc NI NI   Correction:  Glasses       Tonometry (Tonopen, 10:02 AM)      Right Left   Pressure 20 17       Pupils      Dark Light Shape React APD   Right 6 6 Round NR None   Left 5 3 Round Brisk None       Extraocular Movement      Right Left    Full Full       Neuro/Psych    Oriented x3:  Yes   Mood/Affect:  Normal       Dilation    Right eye:  1.0% Mydriacyl, 2.5% Phenylephrine @ 10:02 AM        Slit Lamp and Fundus Exam    External Exam      Right Left   External Edema, Ecchymosis        Slit Lamp Exam      Right Left   Lids/Lashes UL Edema, UL Ecchymosis Normal   Conjunctiva/Sclera 360 Subconjunctival hemorrhage -- improving, Nasal Chemosis White and quiet   Cornea Central epi defect, vertical strand of heme at 0500 1+ Punctate epithelial erosions   Anterior Chamber Deep Deep and quiet   Iris Round and well dilated Round and well dilated   Lens 2+ Nuclear sclerosis, 2+ Cortical cataract, 1-2+ PC feathering , 1+ Posterior subcapsular cataract 2+ Nuclear sclerosis, 2+ Cortical cataract   Vitreous Post vitrectomy; good gas fill Vitreous syneresis, Posterior vitreous detachment       Fundus Exam      Right Left   Disc Hazy view, Pink and Sharp, mildly Tilted disc    C/D Ratio 0.4 0.4   Macula Hazy view, Flat under gas    Vessels Mild Vascular attenuation    Periphery Retina attached, good buckle height, good laser 360 on buckle and surrounding breaks           IMAGING AND PROCEDURES  Imaging and Procedures for _0 @           ASSESSMENT/PLAN:    ICD-10-CM   1. 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 Lattice degeneration of  both retinas H35.413   3. 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 Retinal edema H35.81   5. Posterior vitreous detachment of both eyes  H43.813   6. Combined forms of age-related cataract of both eyes H25.813     1. Rhegmatogenous retinal detachment, RIGHT eye - bullous superotemporal mac on detachment, onset of foveal involvement Saturday, 01/30/17 by history - detached from 1030-1200 oclock, large horseshoe tear from 1030-1100 and linear tear at 1130 within bed of lattice - POD4 s/p SBP + PPV/EL/FAX/14% C3F8 OD, 06.10.19             - did well this week             - retina attached and in good position under gas -- good buckle height and laser around breaks             - IOP mildly elevated             - cont   PF 6x/day OD                         zymaxid QID OD-- until bottle runs out                         Atropine BID OD                         Alphagan P BID OD                         Cosopt BID OD                         PSO ung QID OD             - cont face down positioning 50% of the time; avoid laying flat on back             - continue eye shield when sleepingx 7 days             - post op drop and positioning instructions reviewed             - tylenol/ibuprofen for pain             - Rx given for breakthrough pain  - F/U 2 weeks  2,3. Lattice degeneration OU;  w/ atrophic breaks left eye - OD - superotemporal lattice at site of breaks and detachment - OS - patches of lattice at 12 and 430 with a single atrophic breaks at each site - s/p laser retinopexy OS at time of surgery OD - cont PF OS QID x7 days  4. No retinal edema on exam or OCT  5. PVD / vitreous syneresis OU  Discussed findings and prognosis  RTs and RDs as above  6. Combined form age-related cataract OU-  - The symptoms of cataract, surgical options, and treatments and risks were discussed with patient. - discussed diagnosis and likely progression following PPV OD -  monitor for now   Ophthalmic Meds Ordered this visit:  Meds ordered this encounter  Medications  . prednisoLONE acetate (PRED FORTE) 1 % ophthalmic suspension    Sig: Place 1 drop into the right eye 6 (six) times daily.    Dispense:  15 mL    Refill:  0       Return in about 2 weeks (around 08/26/2017) for POV.  There are no Patient Instructions on file for this visit.   Explained the diagnoses, plan, and follow up  with the patient and they expressed understanding.  Patient expressed understanding of the importance of proper follow up care.   This document serves as a record of services personally performed by Gardiner Sleeper, MD, PhD. It was created on their behalf by Ernest Mallick, OA, an ophthalmic assistant. The creation of this record is the provider's dictation and/or activities during the visit.    Electronically signed by: Ernest Mallick, OA  06.13.2019 10:31 AM    Gardiner Sleeper, M.D., Ph.D. Diseases & Surgery of the Retina and Vitreous [facility]   I have reviewed the above documentation for accuracy and completeness, and I agree with the above. Gardiner Sleeper, M.D., Ph.D. 08/13/17 10:31 AM      Abbreviations: M myopia (nearsighted); A astigmatism; H hyperopia (farsighted); P presbyopia; Mrx spectacle prescription;  CTL contact lenses; OD right eye; OS left eye; OU both eyes  XT exotropia; ET esotropia; PEK punctate epithelial keratitis; PEE punctate epithelial erosions; DES dry eye syndrome; MGD meibomian gland dysfunction; ATs artificial tears; PFAT's preservative free artificial tears; [place] nuclear sclerotic cataract; PSC posterior subcapsular cataract; ERM epi-retinal membrane; PVD posterior vitreous detachment; RD retinal detachment; DM diabetes mellitus; DR diabetic retinopathy; NPDR non-proliferative diabetic retinopathy; PDR proliferative diabetic retinopathy; CSME clinically significant macular edema; DME diabetic macular edema; dbh dot  blot hemorrhages; CWS cotton wool spot; POAG primary open angle glaucoma; C/D cup-to-disc ratio; HVF humphrey visual field; GVF goldmann visual field; OCT optical coherence tomography; IOP intraocular pressure; BRVO Branch retinal vein occlusion; CRVO central retinal vein occlusion; CRAO central retinal artery occlusion; BRAO branch retinal artery occlusion; RT retinal tear; SB scleral buckle; PPV pars plana vitrectomy; VH Vitreous hemorrhage; PRP panretinal laser photocoagulation; IVK intravitreal kenalog; VMT vitreomacular traction; MH Macular hole;  NVD neovascularization of the disc; NVE neovascularization elsewhere; AREDS age related eye disease study; ARMD age related macular degeneration; POAG primary open angle glaucoma; EBMD epithelial/anterior basement membrane dystrophy; ACIOL anterior chamber intraocular lens; IOL intraocular lens; PCIOL posterior chamber intraocular lens; Phaco/IOL phacoemulsification with intraocular lens placement; [place] photorefractive keratectomy; LASIK laser assisted in situ keratomileusis; HTN hypertension; DM diabetes mellitus; COPD chronic obstructive pulmonary disease

## 2017-08-13 ENCOUNTER — Ambulatory Visit (INDEPENDENT_AMBULATORY_CARE_PROVIDER_SITE_OTHER): Payer: Federal, State, Local not specified - PPO | Admitting: Ophthalmology

## 2017-08-13 ENCOUNTER — Encounter (INDEPENDENT_AMBULATORY_CARE_PROVIDER_SITE_OTHER): Payer: Self-pay | Admitting: Ophthalmology

## 2017-08-13 DIAGNOSIS — H43813 Vitreous degeneration, bilateral: Secondary | ICD-10-CM

## 2017-08-13 DIAGNOSIS — H3581 Retinal edema: Secondary | ICD-10-CM

## 2017-08-13 DIAGNOSIS — H3321 Serous retinal detachment, right eye: Secondary | ICD-10-CM

## 2017-08-13 DIAGNOSIS — H25813 Combined forms of age-related cataract, bilateral: Secondary | ICD-10-CM

## 2017-08-13 DIAGNOSIS — H35413 Lattice degeneration of retina, bilateral: Secondary | ICD-10-CM

## 2017-08-13 DIAGNOSIS — H33332 Multiple defects of retina without detachment, left eye: Secondary | ICD-10-CM

## 2017-08-13 MED ORDER — PREDNISOLONE ACETATE 1 % OP SUSP: 1.0000 [drp] | Freq: Every day | OPHTHALMIC | 0 refills | Status: DC

## 2017-08-16 ENCOUNTER — Encounter (INDEPENDENT_AMBULATORY_CARE_PROVIDER_SITE_OTHER): Payer: Federal, State, Local not specified - PPO | Admitting: Ophthalmology

## 2017-08-16 DIAGNOSIS — H338 Other retinal detachments: Secondary | ICD-10-CM

## 2017-08-26 NOTE — Progress Notes (Addendum)
[facility] Note  08/27/2017     CHIEF COMPLAINT Patient presents for Post-op Follow-up   HISTORY OF PRESENT ILLNESS: Chirsty Duncan is a 52 y.o. female who presents to the clinic today for:   HPI    Post-op Follow-up    In right eye.  Discomfort includes itching.  Negative for pain, foreign body sensation, tearing, discharge, floaters and none.  Vision is blurred at distance and is blurred at near.  I, the attending physician,  performed the HPI with the patient and updated documentation appropriately.          Comments    S/P RD repair OD. Pt c/o OD sore at times with eye muscle movement. Itching has improved OD. Vision still blurry OD. Patient taking atropine once daily.        Last edited by Bernarda Caffey, MD on 08/31/2017 12:09 AM. (History)      Referring physician: Ma Hillock, DO [address] [place], [place] [address]  HISTORICAL INFORMATION:   Selected notes from the MEDICAL RECORD NUMBER Referred by ED for possible RD   CURRENT MEDICATIONS: Current Outpatient Medications (Ophthalmic Drugs)  Medication Sig  . atropine 1 % ophthalmic solution Place 1 drop into the right eye 2 (two) times daily at 10 am and 4 pm.  . bacitracin-polymyxin b (POLYSPORIN) ophthalmic ointment Place into the right eye 4 (four) times daily.  . brimonidine (ALPHAGAN) 0.2 % ophthalmic solution 1 drop 2 (two) times daily.  . dorzolamide-timolol (COSOPT) 22.3-6.8 MG/ML ophthalmic solution Place 1 drop into the right eye 2 (two) times daily.  . prednisoLONE acetate (PRED FORTE) 1 % ophthalmic suspension Place 1 drop into the right eye 6 (six) times daily.   No current facility-administered medications for this visit.  (Ophthalmic Drugs)   Current Outpatient Medications (Other)  Medication Sig  . ANUCORT-HC 25 MG suppository Place 25 mg rectally 2 (two) times daily as needed for hemorrhoids or anal itching.   Marland Kitchen BIOTIN PO Take 1 capsule by mouth daily.  .  Cholecalciferol (VITAMIN D3) 5000 units CAPS Take 5,000 capsules by mouth 3 (three) times a week.   . Coenzyme Q10-Vitamin E (COQ10-VITAMIN E) 100-10 MG-UNIT CAPS Take 1 capsule by mouth daily.   . Flaxseed, Linseed, (FLAXSEED OIL PO) Take 15 mLs by mouth daily.   Marland Kitchen omeprazole (PRILOSEC) 40 MG capsule Take 40 mg by mouth 2 (two) times daily.  Marland Kitchen HYDROcodone-acetaminophen (NORCO/VICODIN) 5-325 MG tablet Take 1 tablet by mouth every 4 (four) hours as needed for moderate pain. (Patient not taking: Reported on 08/27/2017)  . pantoprazole (PROTONIX) 40 MG tablet Take 40 mg by mouth 2 (two) times daily.   No current facility-administered medications for this visit.  (Other)      REVIEW OF SYSTEMS: ROS    Positive for: Eyes   Negative for: Constitutional, Gastrointestinal, Neurological, Skin, Genitourinary, Musculoskeletal, HENT, Endocrine, Cardiovascular, Respiratory, Psychiatric, Allergic/Imm, Heme/Lymph   Last edited by Debbrah Alar, COT on 08/27/2017  9:27 AM. (History)       ALLERGIES Allergies  Allergen Reactions  . Asa [Aspirin] Other (See Comments)    Contraindicated due to medical condition    PAST MEDICAL HISTORY Past Medical History:  Diagnosis Date  . Anemia   . GERD (gastroesophageal reflux disease)   . Hearing loss in right ear    Unknown cause, had full work up with Specialist in [place]  . History of chicken pox    Past Surgical  History:  Procedure Laterality Date  . ABDOMINAL EXPLORATION SURGERY    . ABLATION    . DENTAL SURGERY    . EXPLORATORY LAPAROTOMY     "fibrous"  . GAS INSERTION Right 08/09/2017   Procedure: INSERTION OF GAS;  Surgeon: Bernarda Caffey, MD;  Location: [facility];  Service: Ophthalmology;  Laterality: Right;  . LASER PHOTO ABLATION Left 08/09/2017   Procedure: LASER PHOTO ABLATION;  Surgeon: Bernarda Caffey, MD;  Location: [facility];  Service: Ophthalmology;  Laterality: Left;  . SCLERAL BUCKLE WITH POSSIBLE 25 GAUGE PARS PLANA VITRECTOMY Right 08/09/2017    Procedure: SCLERAL BUCKLE WITH 25 GAUGE PARS PLANA VITRECTOMY AND ENDOLASER;  Surgeon: Bernarda Caffey, MD;  Location: [facility];  Service: Ophthalmology;  Laterality: Right;    FAMILY HISTORY Family History  Problem Relation Age of Onset  . Diabetes Mother   . Prostate cancer Father   . Hypertension Father   . Retinal detachment Father   . Lung cancer Paternal Grandfather   . Amblyopia Neg Hx   . Blindness Neg Hx   . Cataracts Neg Hx   . Glaucoma Neg Hx   . Macular degeneration Neg Hx   . Strabismus Neg Hx   . Retinitis pigmentosa Neg Hx     SOCIAL HISTORY Social History   Tobacco Use  . Smoking status: Never Smoker  . Smokeless tobacco: Never Used  Substance Use Topics  . Alcohol use: Yes    Alcohol/week: 0.0 oz    Comment: occasional social use  . Drug use: No         OPHTHALMIC EXAM:  Base Eye Exam    Visual Acuity (Snellen - Linear)      Right Left   Dist cc CF  20/25 +2   Dist ph cc NI NI   Correction:  Glasses       Tonometry (Tonopen, 10:40 AM)      Right Left   Pressure 11 17       Pupils      Dark Light Shape React APD   Right 8  Round Minimal None   Left 5 3 Round Brisk None       Visual Fields      Left Right    Full   OD difficult due to gas bubble       Extraocular Movement      Right Left    Full, Ortho Full, Ortho       Neuro/Psych    Oriented x3:  Yes   Mood/Affect:  Normal       Dilation    Right eye:  1.0% Mydriacyl, 2.5% Phenylephrine @ 9:49 AM        Slit Lamp and Fundus Exam    External Exam      Right Left   External Edema - improved, Ecchymosis - improved        Slit Lamp Exam      Right Left   Lids/Lashes UL Edema - improved, UL Ecchymosis - improved Normal   Conjunctiva/Sclera 360 Subconjunctival hemorrhage -- resolving, sutures dissolving White and quiet   Cornea  2-3+ Punctate epithelial erosions, linear endo pigment at 0600 1+ Punctate epithelial erosions   Anterior Chamber Deep Deep and quiet   Iris  Round and well dilated Round and well dilated   Lens 2+ Nuclear sclerosis, 2+ Cortical cataract, 1-2+ PC feathering , 3+ Posterior subcapsular cataract 2+ Nuclear sclerosis, 2+ Cortical cataract   Vitreous Post vitrectomy; gas bubble at ~  50% Vitreous syneresis, Posterior vitreous detachment       Fundus Exam      Right Left   Disc Hazy view, Pink and Sharp, mildly Tilted disc    C/D Ratio 0.4 0.4   Macula Hazy view, Flat under gas    Vessels Mild Vascular attenuation    Periphery Retina attached, good buckle height, good laser 360 on buckle and surrounding breaks         Refraction    Wearing Rx      Sphere   Right -2.75   Left -3.25   Age:  2 yr   Type:  SVL          IMAGING AND PROCEDURES  Imaging and Procedures for @TODAY @           ASSESSMENT/PLAN:    ICD-10-CM   1. 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 Retinal edema H35.81 CANCELED: OCT, Retina - OU - Both Eyes  5. Posterior vitreous detachment of both eyes H43.813   6. Combined forms of age-related cataract of both eyes H25.813     1. Rhegmatogenous retinal detachment, RIGHT eye - bullous superotemporal mac on detachment, onset of foveal involvement Saturday, 01/30/17 by history - detached from 1030-1200 oclock, large horseshoe tear from 1030-1100 and linear tear at 1130 within bed of lattice - POW 2 s/p SBP + PPV/EL/FAX/14% C3F8 OD, 06.10.19             - doing well             - retina attached and in good position under gas -- good buckle height and laser around breaks             - IOP good             - cont   PF 4x/day OD                         zymaxid QID OD-- STOP                         Atropine BID OD-- STOP                         Alphagan P BID OD                         Cosopt BID OD -- STOP                         PSO ung QID OD             - can dc face down positioning  50% of the time; avoid laying flat on backand looking up             - post op drop and positioning instructions reviewed             - tylenol/ibuprofen for pain             - Rx given for breakthrough pain  - F/U 3 weeks  2,3. Lattice degeneration OU;  w/ atrophic breaks left eye - OD - superotemporal lattice at site of breaks and detachment - OS - patches of lattice at 12 and 430 with  a single atrophic breaks at each site - s/p laser retinopexy OS at time of surgery OD - cont PF OS QID x7 days  4. No retinal edema on exam or OCT  5. PVD / vitreous syneresis OU  Discussed findings and prognosis  RTs and RDs as above  6. Combined form age-related cataract OU-  - The symptoms of cataract, surgical options, and treatments and risks were discussed with patient. - discussed diagnosis and likely progression following PPV OD - monitor for now   Ophthalmic Meds Ordered this visit:  Meds ordered this encounter  Medications  . DISCONTD: bacitracin-polymyxin b (POLYSPORIN) ophthalmic ointment    Sig: Place 10 application into the right eye 4 (four) times daily.    Dispense:  3.5 g    Refill:  0  . bacitracin-polymyxin b (POLYSPORIN) ophthalmic ointment    Sig: Place into the right eye 4 (four) times daily.    Dispense:  3.5 g    Refill:  2       Return in about 3 weeks (around 09/17/2017) for POV.  There are no Patient Instructions on file for this visit.   Explained the diagnoses, plan, and follow up with the patient and they expressed understanding.  Patient expressed understanding of the importance of proper follow up care.   This document serves as a record of services personally performed by Gardiner Sleeper, MD, PhD. It was created on their behalf by Ernest Mallick, OA, an ophthalmic assistant. The creation of this record is the provider's dictation and/or activities during the visit.    Electronically signed by: Ernest Mallick, OA  06.27.2019 8:22 AM    Gardiner Sleeper,  M.D., Ph.D. Diseases & Surgery of the Retina and Vitreous [facility]   I have reviewed the above documentation for accuracy and completeness, and I agree with the above. Gardiner Sleeper, M.D., Ph.D. 08/31/17 8:22 AM       Abbreviations: M myopia (nearsighted); A astigmatism; H hyperopia (farsighted); P presbyopia; Mrx spectacle prescription;  CTL contact lenses; OD right eye; OS left eye; OU both eyes  XT exotropia; ET esotropia; PEK punctate epithelial keratitis; PEE punctate epithelial erosions; DES dry eye syndrome; MGD meibomian gland dysfunction; ATs artificial tears; PFAT's preservative free artificial tears; [place] nuclear sclerotic cataract; PSC posterior subcapsular cataract; ERM epi-retinal membrane; PVD posterior vitreous detachment; RD retinal detachment; DM diabetes mellitus; DR diabetic retinopathy; NPDR non-proliferative diabetic retinopathy; PDR proliferative diabetic retinopathy; CSME clinically significant macular edema; DME diabetic macular edema; dbh dot blot hemorrhages; CWS cotton wool spot; POAG primary open angle glaucoma; C/D cup-to-disc ratio; HVF humphrey visual field; GVF goldmann visual field; OCT optical coherence tomography; IOP intraocular pressure; BRVO Branch retinal vein occlusion; CRVO central retinal vein occlusion; CRAO central retinal artery occlusion; BRAO branch retinal artery occlusion; RT retinal tear; SB scleral buckle; PPV pars plana vitrectomy; VH Vitreous hemorrhage; PRP panretinal laser photocoagulation; IVK intravitreal kenalog; VMT vitreomacular traction; MH Macular hole;  NVD neovascularization of the disc; NVE neovascularization elsewhere; AREDS age related eye disease study; ARMD age related macular degeneration; POAG primary open angle glaucoma; EBMD epithelial/anterior basement membrane dystrophy; ACIOL anterior chamber intraocular lens; IOL intraocular lens; PCIOL posterior chamber intraocular lens; Phaco/IOL  phacoemulsification with intraocular lens placement; [place] photorefractive keratectomy; LASIK laser assisted in situ keratomileusis; HTN hypertension; DM diabetes mellitus; COPD chronic obstructive pulmonary disease

## 2017-08-27 ENCOUNTER — Encounter (INDEPENDENT_AMBULATORY_CARE_PROVIDER_SITE_OTHER): Payer: Self-pay | Admitting: Ophthalmology

## 2017-08-27 ENCOUNTER — Ambulatory Visit (INDEPENDENT_AMBULATORY_CARE_PROVIDER_SITE_OTHER): Payer: Federal, State, Local not specified - PPO | Admitting: Ophthalmology

## 2017-08-27 DIAGNOSIS — H33332 Multiple defects of retina without detachment, left eye: Secondary | ICD-10-CM

## 2017-08-27 DIAGNOSIS — H3321 Serous retinal detachment, right eye: Secondary | ICD-10-CM

## 2017-08-27 DIAGNOSIS — H25813 Combined forms of age-related cataract, bilateral: Secondary | ICD-10-CM

## 2017-08-27 DIAGNOSIS — H43813 Vitreous degeneration, bilateral: Secondary | ICD-10-CM

## 2017-08-27 DIAGNOSIS — H3581 Retinal edema: Secondary | ICD-10-CM

## 2017-08-27 DIAGNOSIS — H35413 Lattice degeneration of retina, bilateral: Secondary | ICD-10-CM

## 2017-08-27 MED ORDER — BACITRACIN-POLYMYXIN B 500-10000 UNIT/GM OP OINT: TOPICAL_OINTMENT | Freq: Four times a day (QID) | OPHTHALMIC | 2 refills | Status: DC

## 2017-08-27 MED ORDER — BACITRACIN-POLYMYXIN B 500-10000 UNIT/GM OP OINT
10.0000 | TOPICAL_OINTMENT | Freq: Four times a day (QID) | OPHTHALMIC | 0 refills | Status: DC
Start: 2017-08-27 — End: 2017-08-27

## 2017-08-31 ENCOUNTER — Encounter (INDEPENDENT_AMBULATORY_CARE_PROVIDER_SITE_OTHER): Payer: Self-pay | Admitting: Ophthalmology

## 2017-09-06 ENCOUNTER — Ambulatory Visit (INDEPENDENT_AMBULATORY_CARE_PROVIDER_SITE_OTHER): Payer: Federal, State, Local not specified - PPO | Admitting: Ophthalmology

## 2017-09-06 ENCOUNTER — Encounter (INDEPENDENT_AMBULATORY_CARE_PROVIDER_SITE_OTHER): Payer: Self-pay | Admitting: Ophthalmology

## 2017-09-06 DIAGNOSIS — H33332 Multiple defects of retina without detachment, left eye: Secondary | ICD-10-CM

## 2017-09-06 DIAGNOSIS — H3321 Serous retinal detachment, right eye: Secondary | ICD-10-CM

## 2017-09-06 DIAGNOSIS — H35413 Lattice degeneration of retina, bilateral: Secondary | ICD-10-CM

## 2017-09-06 DIAGNOSIS — H25813 Combined forms of age-related cataract, bilateral: Secondary | ICD-10-CM

## 2017-09-06 DIAGNOSIS — H43813 Vitreous degeneration, bilateral: Secondary | ICD-10-CM

## 2017-09-06 DIAGNOSIS — H3581 Retinal edema: Secondary | ICD-10-CM

## 2017-09-06 MED ORDER — BRIMONIDINE TARTRATE 0.2 % OP SOLN: 1.0000 [drp] | Freq: Two times a day (BID) | OPHTHALMIC | 2 refills | Status: DC

## 2017-09-06 NOTE — Progress Notes (Signed)
[facility] Note  09/06/2017     CHIEF COMPLAINT Patient presents for Retina Follow Up   HISTORY OF PRESENT ILLNESS: Gail Duncan is a 52 y.o. female who presents to the clinic today for:   HPI    Retina Follow Up    Patient presents with  Retinal Break/Detachment.  In right eye.  Severity is moderate.  Duration of 2 weeks.  Since onset it is stable.  I, the attending physician,  performed the HPI with the patient and updated documentation appropriately.          Comments    Pt presents for RD OD F/U, pt states VA has improved, she feels like vision is sharper, but still blurry, states she has a black spot in her line of vision sometimes depending on how she tilts her head, she states she is having discomfort towards the bottom of her eye, she initially thought it was the band around her eye she states she has had a runny nose since Saturday, she took 2 antihistamines yesterday, but has not taken any today, pt states she is using gtts as directed and it almost out of the "purple top", pt is also using Systane twice a day OD and 3-4 times a day OS       Last edited by Bernarda Caffey, MD on 09/06/2017 11:58 AM. (History)      Referring physician: Ma Hillock, DO [address] [place], [place] [address]  HISTORICAL INFORMATION:   Selected notes from the MEDICAL RECORD NUMBER Referred by ED for possible RD   CURRENT MEDICATIONS: Current Outpatient Medications (Ophthalmic Drugs)  Medication Sig  . atropine 1 % ophthalmic solution Place 1 drop into the right eye 2 (two) times daily at 10 am and 4 pm.  . bacitracin-polymyxin b (POLYSPORIN) ophthalmic ointment Place into the right eye 4 (four) times daily.  . brimonidine (ALPHAGAN) 0.2 % ophthalmic solution Place 1 drop into the right eye 2 (two) times daily.  . dorzolamide-timolol (COSOPT) 22.3-6.8 MG/ML ophthalmic solution Place 1 drop into the right eye 2 (two) times daily.  . prednisoLONE acetate (PRED  FORTE) 1 % ophthalmic suspension Place 1 drop into the right eye 6 (six) times daily.   No current facility-administered medications for this visit.  (Ophthalmic Drugs)   Current Outpatient Medications (Other)  Medication Sig  . chlorpheniramine (CHLOR-TRIMETON) 4 MG tablet Take 4 mg by mouth 2 (two) times daily as needed for allergies.  . ANUCORT-HC 25 MG suppository Place 25 mg rectally 2 (two) times daily as needed for hemorrhoids or anal itching.   Marland Kitchen BIOTIN PO Take 1 capsule by mouth daily.  . Cholecalciferol (VITAMIN D3) 5000 units CAPS Take 5,000 capsules by mouth 3 (three) times a week.   . Coenzyme Q10-Vitamin E (COQ10-VITAMIN E) 100-10 MG-UNIT CAPS Take 1 capsule by mouth daily.   . Flaxseed, Linseed, (FLAXSEED OIL PO) Take 15 mLs by mouth daily.   Marland Kitchen HYDROcodone-acetaminophen (NORCO/VICODIN) 5-325 MG tablet Take 1 tablet by mouth every 4 (four) hours as needed for moderate pain. (Patient not taking: Reported on 08/27/2017)  . omeprazole (PRILOSEC) 40 MG capsule Take 40 mg by mouth 2 (two) times daily.  . pantoprazole (PROTONIX) 40 MG tablet Take 40 mg by mouth 2 (two) times daily.   No current facility-administered medications for this visit.  (Other)      REVIEW OF SYSTEMS: ROS    Positive for: Eyes   Negative for: Constitutional,  Gastrointestinal, Neurological, Skin, Genitourinary, Musculoskeletal, HENT, Endocrine, Cardiovascular, Respiratory, Psychiatric, Allergic/Imm, Heme/Lymph   Last edited by Debbrah Alar, COT on 09/06/2017  9:55 AM. (History)       ALLERGIES Allergies  Allergen Reactions  . Asa [Aspirin] Other (See Comments)    Contraindicated due to medical condition    PAST MEDICAL HISTORY Past Medical History:  Diagnosis Date  . Anemia   . GERD (gastroesophageal reflux disease)   . Hearing loss in right ear    Unknown cause, had full work up with Specialist in [place]  . History of chicken pox    Past Surgical History:  Procedure Laterality Date  .  ABDOMINAL EXPLORATION SURGERY    . ABLATION    . DENTAL SURGERY    . EXPLORATORY LAPAROTOMY     "fibrous"  . GAS INSERTION Right 08/09/2017   Procedure: INSERTION OF GAS;  Surgeon: Bernarda Caffey, MD;  Location: [facility];  Service: Ophthalmology;  Laterality: Right;  . LASER PHOTO ABLATION Left 08/09/2017   Procedure: LASER PHOTO ABLATION;  Surgeon: Bernarda Caffey, MD;  Location: [facility];  Service: Ophthalmology;  Laterality: Left;  . SCLERAL BUCKLE WITH POSSIBLE 25 GAUGE PARS PLANA VITRECTOMY Right 08/09/2017   Procedure: SCLERAL BUCKLE WITH 25 GAUGE PARS PLANA VITRECTOMY AND ENDOLASER;  Surgeon: Bernarda Caffey, MD;  Location: [facility];  Service: Ophthalmology;  Laterality: Right;    FAMILY HISTORY Family History  Problem Relation Age of Onset  . Diabetes Mother   . Prostate cancer Father   . Hypertension Father   . Retinal detachment Father   . Lung cancer Paternal Grandfather   . Amblyopia Neg Hx   . Blindness Neg Hx   . Cataracts Neg Hx   . Glaucoma Neg Hx   . Macular degeneration Neg Hx   . Strabismus Neg Hx   . Retinitis pigmentosa Neg Hx     SOCIAL HISTORY Social History   Tobacco Use  . Smoking status: Never Smoker  . Smokeless tobacco: Never Used  Substance Use Topics  . Alcohol use: Yes    Alcohol/week: 0.0 oz    Comment: occasional social use  . Drug use: No         OPHTHALMIC EXAM:  Base Eye Exam    Visual Acuity (Snellen - Linear)      Right Left   Dist [place] 20/800 20/30 -2   Dist ph [place] 20/200 -2 NI   Correction:  Glasses       Tonometry (Tonopen, 10:04 AM)      Right Left   Pressure 16 18       Pupils      Dark Light Shape React APD   Right 7 7 Round NR None   Left 5 3 Round Brisk None       Visual Fields (Counting fingers)      Left Right    Full    Restrictions  Partial outer superior temporal, inferior temporal, superior nasal, inferior nasal deficiencies       Extraocular Movement      Right Left    Full, Ortho Full, Ortho        Neuro/Psych    Oriented x3:  Yes   Mood/Affect:  Normal       Dilation    Right eye:  1.0% Mydriacyl, 2.5% Phenylephrine @ 10:04 AM        Slit Lamp and Fundus Exam    External Exam      Right Left  External Edema - improved, Ecchymosis - improved        Slit Lamp Exam      Right Left   Lids/Lashes UL Edema - improved, UL Ecchymosis - improved Normal   Conjunctiva/Sclera 360 Subconjunctival hemorrhage -- resolving, sutures dissolving, 1+ Injection White and quiet   Cornea  3+ Punctate epithelial erosions, linear endo pigment at 0600, ointment on surface 1+ Punctate epithelial erosions   Anterior Chamber Deep Deep and quiet   Iris Round and well dilated Round and well dilated   Lens 2-3+ Nuclear sclerosis, 2-3+ Cortical cataract, 3+ Posterior subcapsular cataract 2+ Nuclear sclerosis, 2+ Cortical cataract   Vitreous Post vitrectomy; gas bubble at ~35-40% Vitreous syneresis, Posterior vitreous detachment       Fundus Exam      Right Left   Disc Hazy view, Pink and Sharp, mildly Tilted disc    C/D Ratio 0.4 0.4   Macula Hazy view, Flat under gas    Vessels Mild Vascular attenuation    Periphery hazy view; Retina attached, good buckle height, good laser 360 on buckle and surrounding breaks           IMAGING AND PROCEDURES  Imaging and Procedures for @TODAY @           ASSESSMENT/PLAN:    ICD-10-CM   1. 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 Retinal edema H35.81 CANCELED: OCT, Retina - OU - Both Eyes  5. Posterior vitreous detachment of both eyes H43.813   6. Combined forms of age-related cataract of both eyes H25.813     1. Rhegmatogenous retinal detachment, RIGHT eye - bullous superotemporal mac on detachment, onset of foveal involvement Saturday, 01/30/17 by history - detached from 1030-1200 oclock, large  horseshoe tear from 1030-1100 and linear tear at 1130 within bed of lattice - POW 4 s/p SBP + PPV/EL/FAX/14% C3F8 OD, 06.10.19             - doing well, but presents acutely today for visual changes and mild discomfort inferior -- symptoms now resolved             - retina attached and in good position under gas -- good buckle height and laser around breaks             - IOP good             - cont   PF 4x/day OD                         Alphagan P BID OD                         PSO ung prn OD             - can dc face down positioning 50% of the time; avoid laying flat on backand looking up             - post op drop and positioning instructions reviewed             - tylenol/ibuprofen for pain             - Rx given for breakthrough pain  - F/U as scheduled  2,3. Lattice degeneration OU;  w/ atrophic breaks left eye - OD - superotemporal  lattice at site of breaks and detachment - OS - patches of lattice at 12 and 430 with a single atrophic breaks at each site - s/p laser retinopexy OS at time of surgery OD - laser looks great - cont PF OS QID x7 days  4. No retinal edema on exam or OCT  5. PVD / vitreous syneresis OU  Discussed findings and prognosis  RTs and RDs as above  6. Combined form age-related cataract OU-  - The symptoms of cataract, surgical options, and treatments and risks were discussed with patient. - discussed diagnosis and likely progression following PPV OD - monitor for now   Ophthalmic Meds Ordered this visit:  Meds ordered this encounter  Medications  . brimonidine (ALPHAGAN) 0.2 % ophthalmic solution    Sig: Place 1 drop into the right eye 2 (two) times daily.    Dispense:  10 mL    Refill:  2       Return for as scheduled.  There are no Patient Instructions on file for this visit.   Explained the diagnoses, plan, and follow up with the patient and they expressed understanding.  Patient expressed understanding of the importance of proper  follow up care.   This document serves as a record of services personally performed by Gardiner Sleeper, MD, PhD. It was created on their behalf by Catha Brow, [place], a certified ophthalmic assistant. The creation of this record is the provider's dictation and/or activities during the visit.  Electronically signed by: Catha Brow, COA  07.08.19 11:59 AM    Gardiner Sleeper, M.D., Ph.D. Diseases & Surgery of the Retina and Vitreous [facility]   I have reviewed the above documentation for accuracy and completeness, and I agree with the above. Gardiner Sleeper, M.D., Ph.D. 09/06/17 12:01 PM    Abbreviations: M myopia (nearsighted); A astigmatism; H hyperopia (farsighted); P presbyopia; Mrx spectacle prescription;  CTL contact lenses; OD right eye; OS left eye; OU both eyes  XT exotropia; ET esotropia; PEK punctate epithelial keratitis; PEE punctate epithelial erosions; DES dry eye syndrome; MGD meibomian gland dysfunction; ATs artificial tears; PFAT's preservative free artificial tears; [place] nuclear sclerotic cataract; PSC posterior subcapsular cataract; ERM epi-retinal membrane; PVD posterior vitreous detachment; RD retinal detachment; DM diabetes mellitus; DR diabetic retinopathy; NPDR non-proliferative diabetic retinopathy; PDR proliferative diabetic retinopathy; CSME clinically significant macular edema; DME diabetic macular edema; dbh dot blot hemorrhages; CWS cotton wool spot; POAG primary open angle glaucoma; C/D cup-to-disc ratio; HVF humphrey visual field; GVF goldmann visual field; OCT optical coherence tomography; IOP intraocular pressure; BRVO Branch retinal vein occlusion; CRVO central retinal vein occlusion; CRAO central retinal artery occlusion; BRAO branch retinal artery occlusion; RT retinal tear; SB scleral buckle; PPV pars plana vitrectomy; VH Vitreous hemorrhage; PRP panretinal laser photocoagulation; IVK intravitreal kenalog; VMT vitreomacular traction;  MH Macular hole;  NVD neovascularization of the disc; NVE neovascularization elsewhere; AREDS age related eye disease study; ARMD age related macular degeneration; POAG primary open angle glaucoma; EBMD epithelial/anterior basement membrane dystrophy; ACIOL anterior chamber intraocular lens; IOL intraocular lens; PCIOL posterior chamber intraocular lens; Phaco/IOL phacoemulsification with intraocular lens placement; [place] photorefractive keratectomy; LASIK laser assisted in situ keratomileusis; HTN hypertension; DM diabetes mellitus; COPD chronic obstructive pulmonary disease

## 2017-09-15 NOTE — Progress Notes (Signed)
[facility] Note  09/17/2017     CHIEF COMPLAINT Patient presents for Post-op Follow-up   HISTORY OF PRESENT ILLNESS: Gail Duncan is a 52 y.o. female who presents to the clinic today for:   HPI    Post-op Follow-up    In right eye.  Discomfort includes pain, itching and floaters.  Negative for foreign body sensation, tearing, discharge and none.  Vision is improved.  I, the attending physician,  performed the HPI with the patient and updated documentation appropriately.          Comments    POV RD OD. Patient stastes her vision is gradually getting better,gas bubble is lower part od, yesterday she had had some discomfort after applying PF (she thinks it was due to  her wiping her eye with the tissue).Pt occasionally has itching OD. Pt is using PF gtt's Brimonidine gtt's .       Last edited by Bernarda Caffey, MD on 09/17/2017 10:05 AM. (History)      Referring physician: Ma Hillock, DO [address] [place], [place] [address]  HISTORICAL INFORMATION:   Selected notes from the MEDICAL RECORD NUMBER Referred by ED for possible RD   CURRENT MEDICATIONS: Current Outpatient Medications (Ophthalmic Drugs)  Medication Sig  . atropine 1 % ophthalmic solution Place 1 drop into the right eye 2 (two) times daily at 10 am and 4 pm.  . bacitracin-polymyxin b (POLYSPORIN) ophthalmic ointment Place into the right eye 4 (four) times daily.  . brimonidine (ALPHAGAN) 0.2 % ophthalmic solution Place 1 drop into the right eye 2 (two) times daily.  . dorzolamide-timolol (COSOPT) 22.3-6.8 MG/ML ophthalmic solution Place 1 drop into the right eye 2 (two) times daily.  . prednisoLONE acetate (PRED FORTE) 1 % ophthalmic suspension Place 1 drop into the right eye 6 (six) times daily.   No current facility-administered medications for this visit.  (Ophthalmic Drugs)   Current Outpatient Medications (Other)  Medication Sig  . ANUCORT-HC 25 MG suppository Place 25 mg  rectally 2 (two) times daily as needed for hemorrhoids or anal itching.   Marland Kitchen BIOTIN PO Take 1 capsule by mouth daily.  . chlorpheniramine (CHLOR-TRIMETON) 4 MG tablet Take 4 mg by mouth 2 (two) times daily as needed for allergies.  . Cholecalciferol (VITAMIN D3) 5000 units CAPS Take 5,000 capsules by mouth 3 (three) times a week.   . Coenzyme Q10-Vitamin E (COQ10-VITAMIN E) 100-10 MG-UNIT CAPS Take 1 capsule by mouth daily.   . Flaxseed, Linseed, (FLAXSEED OIL PO) Take 15 mLs by mouth daily.   Marland Kitchen HYDROcodone-acetaminophen (NORCO/VICODIN) 5-325 MG tablet Take 1 tablet by mouth every 4 (four) hours as needed for moderate pain.  Marland Kitchen omeprazole (PRILOSEC) 40 MG capsule Take 40 mg by mouth 2 (two) times daily.  . pantoprazole (PROTONIX) 40 MG tablet Take 40 mg by mouth 2 (two) times daily.   No current facility-administered medications for this visit.  (Other)      REVIEW OF SYSTEMS: ROS    Positive for: Eyes   Negative for: Constitutional, Gastrointestinal, Neurological, Skin, Genitourinary, Musculoskeletal, HENT, Endocrine, Cardiovascular, Respiratory, Psychiatric, Allergic/Imm, Heme/Lymph   Last edited by Zenovia Jordan, LPN on 4/76/5465  0:35 AM. (History)       ALLERGIES Allergies  Allergen Reactions  . Asa [Aspirin] Other (See Comments)    Contraindicated due to medical condition    PAST MEDICAL HISTORY Past Medical History:  Diagnosis Date  . Anemia   . GERD (  gastroesophageal reflux disease)   . Hearing loss in right ear    Unknown cause, had full work up with Specialist in [place]  . History of chicken pox    Past Surgical History:  Procedure Laterality Date  . ABDOMINAL EXPLORATION SURGERY    . ABLATION    . DENTAL SURGERY    . EXPLORATORY LAPAROTOMY     "fibrous"  . GAS INSERTION Right 08/09/2017   Procedure: INSERTION OF GAS;  Surgeon: Bernarda Caffey, MD;  Location: [place];  Service: Ophthalmology;  Laterality: Right;  . LASER PHOTO ABLATION Left 08/09/2017   Procedure:  LASER PHOTO ABLATION;  Surgeon: Bernarda Caffey, MD;  Location: [place];  Service: Ophthalmology;  Laterality: Left;  . SCLERAL BUCKLE WITH POSSIBLE 25 GAUGE PARS PLANA VITRECTOMY Right 08/09/2017   Procedure: SCLERAL BUCKLE WITH 25 GAUGE PARS PLANA VITRECTOMY AND ENDOLASER;  Surgeon: Bernarda Caffey, MD;  Location: [place];  Service: Ophthalmology;  Laterality: Right;    FAMILY HISTORY Family History  Problem Relation Age of Onset  . Diabetes Mother   . Prostate cancer Father   . Hypertension Father   . Retinal detachment Father   . Lung cancer Paternal Grandfather   . Amblyopia Neg Hx   . Blindness Neg Hx   . Cataracts Neg Hx   . Glaucoma Neg Hx   . Macular degeneration Neg Hx   . Strabismus Neg Hx   . Retinitis pigmentosa Neg Hx     SOCIAL HISTORY Social History   Tobacco Use  . Smoking status: Never Smoker  . Smokeless tobacco: Never Used  Substance Use Topics  . Alcohol use: Yes    Alcohol/week: 0.0 oz    Comment: occasional social use  . Drug use: No         OPHTHALMIC EXAM:  Base Eye Exam    Visual Acuity (Snellen - Linear)      Right Left   Dist cc 20/150 20/30   Dist ph cc 20/100 NI       Tonometry (Tonopen, 9:15 AM)      Right Left   Pressure 18 16       Pupils      Dark Light Shape React APD   Right 6 5 Round NR None   Left 5 4 Round Brisk None       Visual Fields (Counting fingers)      Left Right    Full Full       Extraocular Movement      Right Left    Full, Ortho Full, Ortho  Part. Sup. Temp. Infer. Temp. Sup. Nasal. Infer. Nasal def       Neuro/Psych    Oriented x3:  Yes   Mood/Affect:  Normal       Dilation    Both eyes:  1.0% Mydriacyl, 2.5% Phenylephrine @ 9:18 AM        Slit Lamp and Fundus Exam    External Exam      Right Left   External Edema - improved, Ecchymosis - improved        Slit Lamp Exam      Right Left   Lids/Lashes UL Edema - improved, UL Ecchymosis - improved, Meibomian gland dysfunction, Scurf Normal    Conjunctiva/Sclera 360 Subconjunctival hemorrhage -- resolving, sutures dissolving, 1 residual suture at 0930, 1+ Injection White and quiet   Cornea  1-2+ diffuse Punctate epithelial erosions, linear endo pigment at 0600, ointment on surface 1+ Punctate epithelial erosions  Anterior Chamber Deep Deep and quiet   Iris Round and well dilated Round and well dilated   Lens 2-3+ Nuclear sclerosis, 2-3+ Cortical cataract, 3+ Posterior subcapsular cataract 2+ Nuclear sclerosis, 2+ Cortical cataract   Vitreous Post vitrectomy; gas bubble at ~20-25% Vitreous syneresis, Posterior vitreous detachment       Fundus Exam      Right Left   Disc Hazy view, Pink and Sharp, mildly Tilted disc    C/D Ratio 0.2 0.4   Macula Hazy view, Flat under gas    Vessels Normal    Periphery Very hazy view; Retina attached, good buckle height, good laser 360 on buckle and surrounding breaks           IMAGING AND PROCEDURES  Imaging and Procedures for @TODAY @  OCT, Retina - OU - Both Eyes       Right Eye Quality was poor. Central Foveal Thickness: 330. Progression has improved. Findings include normal foveal contour, no IRF, no SRF.   Left Eye Quality was good. Central Foveal Thickness: 279. Progression has been stable. Findings include normal foveal contour, no IRF, no SRF, vitreomacular adhesion .   Notes *Images captured and stored on drive  Diagnosis / Impression:  OD: NFP, No IRF/SRF, retina reattached OS: NFP, No IRF/SRF, VMA  Clinical management:  See below  Abbreviations: NFP - Normal foveal profile. CME - cystoid macular edema. PED - pigment epithelial detachment. IRF - intraretinal fluid. SRF - subretinal fluid. EZ - ellipsoid zone. ERM - epiretinal membrane. ORA - outer retinal atrophy. ORT - outer retinal tubulation. SRHM - subretinal hyper-reflective material                  ASSESSMENT/PLAN:    ICD-10-CM   1. 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 Retinal edema H35.81 OCT, Retina - OU - Both Eyes  5. Posterior vitreous detachment of both eyes H43.813   6. Combined forms of age-related cataract of both eyes H25.813     1. Rhegmatogenous retinal detachment, RIGHT eye - bullous superotemporal mac on detachment, onset of foveal involvement Saturday, 01/30/17 by history - detached from 1030-1200 oclock, large horseshoe tear from 1030-1100 and linear tear at 1130 within bed of lattice - POW 5 s/p SBP + PPV/EL/FAX/14% C3F8 OD, 06.10.19             - doing well, but significant post op PSC             - retina attached and in good position under gas -- good buckle height and laser around breaks             - IOP good             - cont   PF 4x/day OD                         Alphagan P BID OD                         PSO ung prn OD             - avoid laying flat on backand looking up             - post op drop and positioning instructions reviewed             -  tylenol/ibuprofen for pain             - Rx given for breakthrough pain  - F/U week of August 5th  2,3. Lattice degeneration OU;  w/ atrophic breaks left eye - OD - superotemporal lattice at site of breaks and detachment - OS - patches of lattice at 12 and 430 with a single atrophic breaks at each site - s/p laser retinopexy OS at time of surgery OD - good laser in place - cont PF OS QID x7 days  4. No retinal edema on exam or OCT  5. PVD / vitreous syneresis OU  Discussed findings and prognosis  RTs and RDs as above  6. Combined form age-related cataract OU-  - The symptoms of cataract, surgical options, and treatments and risks were discussed with patient. - discussed diagnosis and likely progression following PPV OD - monitor for now   Ophthalmic Meds Ordered this visit:  No orders of the defined types were placed in this encounter.       Return in about 2 weeks (around 10/04/2017) for POV.  There are no Patient Instructions on file for this visit.   Explained the diagnoses, plan, and follow up with the patient and they expressed understanding.  Patient expressed understanding of the importance of proper follow up care.   This document serves as a record of services personally performed by Gardiner Sleeper, MD, PhD. It was created on their behalf by Catha Brow, [place], a certified ophthalmic assistant. The creation of this record is the provider's dictation and/or activities during the visit.  Electronically signed by: Catha Brow, COA  07.17.19 10:12 AM   Gardiner Sleeper, M.D., Ph.D. Diseases & Surgery of the Retina and Vitreous [facility]  I have reviewed the above documentation for accuracy and completeness, and I agree with the above. Gardiner Sleeper, M.D., Ph.D. 09/17/17 10:16 AM     Abbreviations: M myopia (nearsighted); A astigmatism; H hyperopia (farsighted); P presbyopia; Mrx spectacle prescription;  CTL contact lenses; OD right eye; OS left eye; OU both eyes  XT exotropia; ET esotropia; PEK punctate epithelial keratitis; PEE punctate epithelial erosions; DES dry eye syndrome; MGD meibomian gland dysfunction; ATs artificial tears; PFAT's preservative free artificial tears; [place] nuclear sclerotic cataract; PSC posterior subcapsular cataract; ERM epi-retinal membrane; PVD posterior vitreous detachment; RD retinal detachment; DM diabetes mellitus; DR diabetic retinopathy; NPDR non-proliferative diabetic retinopathy; PDR proliferative diabetic retinopathy; CSME clinically significant macular edema; DME diabetic macular edema; dbh dot blot hemorrhages; CWS cotton wool spot; POAG primary open angle glaucoma; C/D cup-to-disc ratio; HVF humphrey visual field; GVF goldmann visual field; OCT optical coherence tomography; IOP intraocular pressure; BRVO Branch retinal vein occlusion; CRVO central  retinal vein occlusion; CRAO central retinal artery occlusion; BRAO branch retinal artery occlusion; RT retinal tear; SB scleral buckle; PPV pars plana vitrectomy; VH Vitreous hemorrhage; PRP panretinal laser photocoagulation; IVK intravitreal kenalog; VMT vitreomacular traction; MH Macular hole;  NVD neovascularization of the disc; NVE neovascularization elsewhere; AREDS age related eye disease study; ARMD age related macular degeneration; POAG primary open angle glaucoma; EBMD epithelial/anterior basement membrane dystrophy; ACIOL anterior chamber intraocular lens; IOL intraocular lens; PCIOL posterior chamber intraocular lens; Phaco/IOL phacoemulsification with intraocular lens placement; [place] photorefractive keratectomy; LASIK laser assisted in situ keratomileusis; HTN hypertension; DM diabetes mellitus; COPD chronic obstructive pulmonary disease

## 2017-09-17 ENCOUNTER — Encounter (INDEPENDENT_AMBULATORY_CARE_PROVIDER_SITE_OTHER): Payer: Self-pay | Admitting: Ophthalmology

## 2017-09-17 ENCOUNTER — Ambulatory Visit (INDEPENDENT_AMBULATORY_CARE_PROVIDER_SITE_OTHER): Payer: Federal, State, Local not specified - PPO | Admitting: Ophthalmology

## 2017-09-17 DIAGNOSIS — H3581 Retinal edema: Secondary | ICD-10-CM

## 2017-09-17 DIAGNOSIS — H43813 Vitreous degeneration, bilateral: Secondary | ICD-10-CM

## 2017-09-17 DIAGNOSIS — H33332 Multiple defects of retina without detachment, left eye: Secondary | ICD-10-CM

## 2017-09-17 DIAGNOSIS — H3321 Serous retinal detachment, right eye: Secondary | ICD-10-CM

## 2017-09-17 DIAGNOSIS — H25813 Combined forms of age-related cataract, bilateral: Secondary | ICD-10-CM

## 2017-09-17 DIAGNOSIS — H35413 Lattice degeneration of retina, bilateral: Secondary | ICD-10-CM

## 2017-10-04 NOTE — Progress Notes (Signed)
[facility] Note  10/05/2017     CHIEF COMPLAINT Patient presents for Post-op Follow-up   HISTORY OF PRESENT ILLNESS: Gail Duncan is a 52 y.o. female who presents to the clinic today for:   HPI    Post-op Follow-up    In right eye.  Discomfort includes floaters.  Negative for pain, itching, foreign body sensation, tearing, discharge and none.  Vision is improved.  I, the attending physician,  performed the HPI with the patient and updated documentation appropriately.          Comments    POV 25g PARs Plana vit. W/fluid exch (08/09/17). Patient states "I can see better", the bubble is smaller than a dime and is located the lower part of vision. Pt has floaters occasionally. Denies ocular pain. Pt is using PF gtt's, Brimonidine gtt's, Systane PRN.       Last edited by Bernarda Caffey, MD on 10/05/2017 10:32 AM. (History)      Referring physician: Ma Hillock, DO [address] [place], [place] [address]  HISTORICAL INFORMATION:   Selected notes from the MEDICAL RECORD NUMBER Referred by ED for possible RD   CURRENT MEDICATIONS: Current Outpatient Medications (Ophthalmic Drugs)  Medication Sig  . atropine 1 % ophthalmic solution Place 1 drop into the right eye 2 (two) times daily at 10 am and 4 pm.  . bacitracin-polymyxin b (POLYSPORIN) ophthalmic ointment Place into the right eye 4 (four) times daily.  . brimonidine (ALPHAGAN) 0.2 % ophthalmic solution Place 1 drop into the right eye 2 (two) times daily.  . dorzolamide-timolol (COSOPT) 22.3-6.8 MG/ML ophthalmic solution Place 1 drop into the right eye 2 (two) times daily.  . prednisoLONE acetate (PRED FORTE) 1 % ophthalmic suspension Place 1 drop into the right eye 6 (six) times daily.   No current facility-administered medications for this visit.  (Ophthalmic Drugs)   Current Outpatient Medications (Other)  Medication Sig  . ANUCORT-HC 25 MG suppository Place 25 mg rectally 2 (two) times daily as  needed for hemorrhoids or anal itching.   Marland Kitchen BIOTIN PO Take 1 capsule by mouth daily.  . chlorpheniramine (CHLOR-TRIMETON) 4 MG tablet Take 4 mg by mouth 2 (two) times daily as needed for allergies.  . Cholecalciferol (VITAMIN D3) 5000 units CAPS Take 5,000 capsules by mouth 3 (three) times a week.   . Coenzyme Q10-Vitamin E (COQ10-VITAMIN E) 100-10 MG-UNIT CAPS Take 1 capsule by mouth daily.   . Flaxseed, Linseed, (FLAXSEED OIL PO) Take 15 mLs by mouth daily.   Marland Kitchen HYDROcodone-acetaminophen (NORCO/VICODIN) 5-325 MG tablet Take 1 tablet by mouth every 4 (four) hours as needed for moderate pain.  Marland Kitchen omeprazole (PRILOSEC) 40 MG capsule Take 40 mg by mouth 2 (two) times daily.  . pantoprazole (PROTONIX) 40 MG tablet Take 40 mg by mouth 2 (two) times daily.   No current facility-administered medications for this visit.  (Other)      REVIEW OF SYSTEMS: ROS    Positive for: Eyes   Negative for: Constitutional, Gastrointestinal, Neurological, Skin, Genitourinary, Musculoskeletal, HENT, Endocrine, Cardiovascular, Respiratory, Psychiatric, Allergic/Imm, Heme/Lymph   Last edited by Zenovia Jordan, LPN on 04/06/4268  6:23 AM. (History)       ALLERGIES Allergies  Allergen Reactions  . Asa [Aspirin] Other (See Comments)    Contraindicated due to medical condition    PAST MEDICAL HISTORY Past Medical History:  Diagnosis Date  . Anemia   . GERD (gastroesophageal reflux disease)   . Hearing  loss in right ear    Unknown cause, had full work up with Specialist in [place]  . History of chicken pox    Past Surgical History:  Procedure Laterality Date  . ABDOMINAL EXPLORATION SURGERY    . ABLATION    . DENTAL SURGERY    . EXPLORATORY LAPAROTOMY     "fibrous"  . GAS INSERTION Right 08/09/2017   Procedure: INSERTION OF GAS;  Surgeon: Bernarda Caffey, MD;  Location: [facility];  Service: Ophthalmology;  Laterality: Right;  . LASER PHOTO ABLATION Left 08/09/2017   Procedure: LASER PHOTO ABLATION;  Surgeon:  Bernarda Caffey, MD;  Location: [facility];  Service: Ophthalmology;  Laterality: Left;  . SCLERAL BUCKLE WITH POSSIBLE 25 GAUGE PARS PLANA VITRECTOMY Right 08/09/2017   Procedure: SCLERAL BUCKLE WITH 25 GAUGE PARS PLANA VITRECTOMY AND ENDOLASER;  Surgeon: Bernarda Caffey, MD;  Location: [facility];  Service: Ophthalmology;  Laterality: Right;    FAMILY HISTORY Family History  Problem Relation Age of Onset  . Diabetes Mother   . Prostate cancer Father   . Hypertension Father   . Retinal detachment Father   . Lung cancer Paternal Grandfather   . Amblyopia Neg Hx   . Blindness Neg Hx   . Cataracts Neg Hx   . Glaucoma Neg Hx   . Macular degeneration Neg Hx   . Strabismus Neg Hx   . Retinitis pigmentosa Neg Hx     SOCIAL HISTORY Social History   Tobacco Use  . Smoking status: Never Smoker  . Smokeless tobacco: Never Used  Substance Use Topics  . Alcohol use: Yes    Alcohol/week: 0.0 oz    Comment: occasional social use  . Drug use: No         OPHTHALMIC EXAM:  Base Eye Exam    Visual Acuity (Snellen - Linear)      Right Left   Dist cc 20/60 +1 20/30 -1   Dist ph cc 20/40 +1 20/25 -2   Correction:  Glasses       Tonometry (Tonopen, 9:32 AM)      Right Left   Pressure 13 19       Pupils      Dark Light Shape React APD   Right 4 3 Round Brisk None   Left 4 3 Round Brisk None       Visual Fields (Counting fingers)      Left Right    Full Full       Extraocular Movement      Right Left    Full, Ortho Full, Ortho       Neuro/Psych    Oriented x3:  Yes   Mood/Affect:  Normal       Dilation    Right eye:  1.0% Mydriacyl, 2.5% Phenylephrine @ 9:32 AM        Slit Lamp and Fundus Exam    External Exam      Right Left   External Normal        Slit Lamp Exam      Right Left   Lids/Lashes Meibomian gland dysfunction, [place] Normal   Conjunctiva/Sclera Subconjunctival hemorrhage -- resolving, sutures dissolved White and quiet   Cornea  1-2+ diffuse Punctate  epithelial erosions 1+ Punctate epithelial erosions   Anterior Chamber Deep Deep and quiet   Iris Round and well dilated Round and well dilated   Lens 2+ Nuclear sclerosis, 2+ Cortical cataract, 3+ Posterior subcapsular cataract 2+ Nuclear sclerosis, 2+ Cortical cataract  Vitreous Post vitrectomy; gas bubble at ~2% Vitreous syneresis, Posterior vitreous detachment       Fundus Exam      Right Left   Disc Hazy view, Pink and Sharp, mildly Tilted disc    C/D Ratio 0.2 0.4   Macula Hazy view, Flat, blunted foveal reflex    Vessels Normal    Periphery hazy view; Retina attached, good buckle height, good laser 360 on buckle and surrounding breaks           IMAGING AND PROCEDURES  Imaging and Procedures for _0 @  OCT, Retina - OU - Both Eyes       Right Eye Quality was borderline. Central Foveal Thickness: 257. Progression has been stable. Findings include normal foveal contour, no IRF, no SRF.   Left Eye Quality was good. Central Foveal Thickness: 285. Progression has been stable. Findings include normal foveal contour, no IRF, no SRF, vitreomacular adhesion .   Notes *Images captured and stored on drive  Diagnosis / Impression:  OD: NFP, No IRF/SRF, retina reattached OS: NFP, No IRF/SRF, VMA  Clinical management:  See below  Abbreviations: NFP - Normal foveal profile. CME - cystoid macular edema. PED - pigment epithelial detachment. IRF - intraretinal fluid. SRF - subretinal fluid. EZ - ellipsoid zone. ERM - epiretinal membrane. ORA - outer retinal atrophy. ORT - outer retinal tubulation. SRHM - subretinal hyper-reflective material                  ASSESSMENT/PLAN:    ICD-10-CM   1. 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 Retinal edema H35.81 OCT, Retina - OU - Both Eyes  5. Posterior vitreous detachment of both eyes  H43.813   6. Combined forms of age-related cataract of both eyes H25.813     1. Rhegmatogenous retinal detachment, RIGHT eye - bullous superotemporal mac on detachment, onset of foveal involvement Saturday, 01/30/17 by history - detached from 1030-1200 oclock, large horseshoe tear from 1030-1100 and linear tear at 1130 within bed of lattice - POW 8 s/p SBP + PPV/EL/FAX/14% C3F8 OD, 06.10.19             - doing well, but significant post op PSC             - retina attached and in good position -- good buckle height and laser around breaks             - IOP good and gas bubble almost gone(2% left)             - start   PF taper -- 4,3,2,1 drops daily, decrease weekly  - dec  Alphagan to QD OD  - cont PSO ung prn OD             - avoid laying flat on backand looking up             - post op drop and positioning instructions reviewed  - F/U 4 weeks  2,3. Lattice degeneration OU;  w/ atrophic breaks left eye - OD - superotemporal lattice at site of breaks and detachment - OS - patches of lattice at 12 and 430 with a single atrophic breaks at each site - s/p laser retinopexy OS at time of surgery OD - good laser in place - monitor  4. No retinal edema on exam or  OCT  5. PVD / vitreous syneresis OU  Discussed findings and prognosis  RTs and RDs as above  6. Combined form age-related cataract OU-  - The symptoms of cataract, surgical options, and treatments and risks were discussed with patient. - discussed diagnosis and likely progression following PPV OD - monitor for now   Ophthalmic Meds Ordered this visit:  No orders of the defined types were placed in this encounter.      Return in about 1 month (around 11/02/2017) for F/U RD repair OD, DFE, OCT.  There are no Patient Instructions on file for this visit.   Explained the diagnoses, plan, and follow up with the patient and they expressed understanding.  Patient expressed understanding of the importance of proper follow  up care.   This document serves as a record of services personally performed by Gardiner Sleeper, MD, PhD. It was created on their behalf by Catha Brow, [place], a certified ophthalmic assistant. The creation of this record is the provider's dictation and/or activities during the visit.  Electronically signed by: Catha Brow, COA  08.05.19 1:02 PM    Gardiner Sleeper, M.D., Ph.D. Diseases & Surgery of the Retina and Vitreous [facility]  I have reviewed the above documentation for accuracy and completeness, and I agree with the above. Gardiner Sleeper, M.D., Ph.D. 10/05/17 1:05 PM    Abbreviations: M myopia (nearsighted); A astigmatism; H hyperopia (farsighted); P presbyopia; Mrx spectacle prescription;  CTL contact lenses; OD right eye; OS left eye; OU both eyes  XT exotropia; ET esotropia; PEK punctate epithelial keratitis; PEE punctate epithelial erosions; DES dry eye syndrome; MGD meibomian gland dysfunction; ATs artificial tears; PFAT's preservative free artificial tears; [place] nuclear sclerotic cataract; PSC posterior subcapsular cataract; ERM epi-retinal membrane; PVD posterior vitreous detachment; RD retinal detachment; DM diabetes mellitus; DR diabetic retinopathy; NPDR non-proliferative diabetic retinopathy; PDR proliferative diabetic retinopathy; CSME clinically significant macular edema; DME diabetic macular edema; dbh dot blot hemorrhages; CWS cotton wool spot; POAG primary open angle glaucoma; C/D cup-to-disc ratio; HVF humphrey visual field; GVF goldmann visual field; OCT optical coherence tomography; IOP intraocular pressure; BRVO Branch retinal vein occlusion; CRVO central retinal vein occlusion; CRAO central retinal artery occlusion; BRAO branch retinal artery occlusion; RT retinal tear; SB scleral buckle; PPV pars plana vitrectomy; VH Vitreous hemorrhage; PRP panretinal laser photocoagulation; IVK intravitreal kenalog; VMT vitreomacular traction; MH Macular  hole;  NVD neovascularization of the disc; NVE neovascularization elsewhere; AREDS age related eye disease study; ARMD age related macular degeneration; POAG primary open angle glaucoma; EBMD epithelial/anterior basement membrane dystrophy; ACIOL anterior chamber intraocular lens; IOL intraocular lens; PCIOL posterior chamber intraocular lens; Phaco/IOL phacoemulsification with intraocular lens placement; [place] photorefractive keratectomy; LASIK laser assisted in situ keratomileusis; HTN hypertension; DM diabetes mellitus; COPD chronic obstructive pulmonary disease

## 2017-10-05 ENCOUNTER — Ambulatory Visit (INDEPENDENT_AMBULATORY_CARE_PROVIDER_SITE_OTHER): Payer: Federal, State, Local not specified - PPO | Admitting: Ophthalmology

## 2017-10-05 ENCOUNTER — Encounter (INDEPENDENT_AMBULATORY_CARE_PROVIDER_SITE_OTHER): Payer: Self-pay | Admitting: Ophthalmology

## 2017-10-05 DIAGNOSIS — H3581 Retinal edema: Secondary | ICD-10-CM | POA: Diagnosis not present

## 2017-10-05 DIAGNOSIS — H25813 Combined forms of age-related cataract, bilateral: Secondary | ICD-10-CM

## 2017-10-05 DIAGNOSIS — H33332 Multiple defects of retina without detachment, left eye: Secondary | ICD-10-CM

## 2017-10-05 DIAGNOSIS — H35413 Lattice degeneration of retina, bilateral: Secondary | ICD-10-CM

## 2017-10-05 DIAGNOSIS — H43813 Vitreous degeneration, bilateral: Secondary | ICD-10-CM

## 2017-10-05 DIAGNOSIS — H3321 Serous retinal detachment, right eye: Secondary | ICD-10-CM

## 2017-10-07 MED ORDER — PREDNISOLONE ACETATE 1 % OP SUSP: 1.0000 [drp] | Freq: Four times a day (QID) | OPHTHALMIC | 0 refills | Status: DC

## 2017-10-07 NOTE — Addendum Note (Signed)
Addended by: Karie ChimeraZAMORA, Ebba Goll G on: 10/07/2017 09:30 AM   Modules accepted: Orders

## 2017-10-12 ENCOUNTER — Encounter (INDEPENDENT_AMBULATORY_CARE_PROVIDER_SITE_OTHER): Payer: Self-pay | Admitting: Ophthalmology

## 2017-10-12 ENCOUNTER — Ambulatory Visit (INDEPENDENT_AMBULATORY_CARE_PROVIDER_SITE_OTHER): Payer: Federal, State, Local not specified - PPO | Admitting: Ophthalmology

## 2017-10-12 DIAGNOSIS — H3581 Retinal edema: Secondary | ICD-10-CM | POA: Diagnosis not present

## 2017-10-12 DIAGNOSIS — H33332 Multiple defects of retina without detachment, left eye: Secondary | ICD-10-CM

## 2017-10-12 DIAGNOSIS — H35413 Lattice degeneration of retina, bilateral: Secondary | ICD-10-CM | POA: Diagnosis not present

## 2017-10-12 DIAGNOSIS — H25813 Combined forms of age-related cataract, bilateral: Secondary | ICD-10-CM

## 2017-10-12 DIAGNOSIS — H43813 Vitreous degeneration, bilateral: Secondary | ICD-10-CM

## 2017-10-12 DIAGNOSIS — H3321 Serous retinal detachment, right eye: Secondary | ICD-10-CM

## 2017-10-12 NOTE — Progress Notes (Signed)
[facility] Note  10/12/2017     CHIEF COMPLAINT Patient presents for Blurred Vision   HISTORY OF PRESENT ILLNESS: Gail Duncan is a 52 y.o. female who presents to the clinic today for:   HPI    Blurred Vision    In left eye.  Onset was sudden.  Vision is blurred, difficult to focus and hazy.  Severity is mild.  This started 1 day ago.  Occurring constantly.  It is worse in the morning, in the evening and throughout the day.  Context:  distance vision, mid-range vision and near vision.  Since onset it is gradually worsening.  Associated symptoms include Negative for glare, haloes, double vision, a need for brighter lights, dryness, eye pain, redness, tearing, trauma, pain with eye movement, abnormal color vision, headache, scalp tenderness, jaw claudication, shoulder/hip pain, fever, weight loss and fatigue.  Treatments tried include no treatments.  Response to treatment was no improvement.  I, the attending physician,  performed the HPI with the patient and updated documentation appropriately.          Comments    Pt presents for decreased VA OS; Pt states she "saw a single flash of light" on Sunday night and "woke up with decreased VA'; Pt states OS VA "just isnt as clear"; Pt states she "felt where my retina was lasered in the left eye before my vision got blurred"; Pt states she has only had a single flash, denies floaters, denies ocular pain, denies wavy VA;        Last edited by Bernarda Caffey, MD on 10/12/2017 11:17 AM. (History)      Referring physician: Ma Hillock, DO [address] [place], [place] [address]  HISTORICAL INFORMATION:   Selected notes from the MEDICAL RECORD NUMBER Referred by ED for possible RD   CURRENT MEDICATIONS: Current Outpatient Medications (Ophthalmic Drugs)  Medication Sig  . atropine 1 % ophthalmic solution Place 1 drop into the right eye 2 (two) times daily at 10 am and 4 pm.  . bacitracin-polymyxin b (POLYSPORIN)  ophthalmic ointment Place into the right eye 4 (four) times daily.  . brimonidine (ALPHAGAN) 0.2 % ophthalmic solution Place 1 drop into the right eye 2 (two) times daily.  . dorzolamide-timolol (COSOPT) 22.3-6.8 MG/ML ophthalmic solution Place 1 drop into the right eye 2 (two) times daily.  . prednisoLONE acetate (PRED FORTE) 1 % ophthalmic suspension Place 1 drop into the right eye 4 (four) times daily.   No current facility-administered medications for this visit.  (Ophthalmic Drugs)   Current Outpatient Medications (Other)  Medication Sig  . ANUCORT-HC 25 MG suppository Place 25 mg rectally 2 (two) times daily as needed for hemorrhoids or anal itching.   Marland Kitchen BIOTIN PO Take 1 capsule by mouth daily.  . chlorpheniramine (CHLOR-TRIMETON) 4 MG tablet Take 4 mg by mouth 2 (two) times daily as needed for allergies.  . Cholecalciferol (VITAMIN D3) 5000 units CAPS Take 5,000 capsules by mouth 3 (three) times a week.   . Coenzyme Q10-Vitamin E (COQ10-VITAMIN E) 100-10 MG-UNIT CAPS Take 1 capsule by mouth daily.   . Flaxseed, Linseed, (FLAXSEED OIL PO) Take 15 mLs by mouth daily.   Marland Kitchen HYDROcodone-acetaminophen (NORCO/VICODIN) 5-325 MG tablet Take 1 tablet by mouth every 4 (four) hours as needed for moderate pain.  Marland Kitchen omeprazole (PRILOSEC) 40 MG capsule Take 40 mg by mouth 2 (two) times daily.  . pantoprazole (PROTONIX) 40 MG tablet Take 40 mg by mouth  2 (two) times daily.   No current facility-administered medications for this visit.  (Other)      REVIEW OF SYSTEMS: ROS    Positive for: Eyes, Heme/Lymph   Negative for: Constitutional, Gastrointestinal, Neurological, Skin, Genitourinary, Musculoskeletal, HENT, Endocrine, Cardiovascular, Respiratory, Psychiatric, Allergic/Imm   Last edited by Cherrie Gauze, COA on 10/12/2017  9:24 AM. (History)       ALLERGIES Allergies  Allergen Reactions  . Asa [Aspirin] Other (See Comments)    Contraindicated due to medical condition    PAST  MEDICAL HISTORY Past Medical History:  Diagnosis Date  . Anemia   . GERD (gastroesophageal reflux disease)   . Hearing loss in right ear    Unknown cause, had full work up with Specialist in [place]  . History of chicken pox    Past Surgical History:  Procedure Laterality Date  . ABDOMINAL EXPLORATION SURGERY    . ABLATION    . DENTAL SURGERY    . EXPLORATORY LAPAROTOMY     "fibrous"  . GAS INSERTION Right 08/09/2017   Procedure: INSERTION OF GAS;  Surgeon: Bernarda Caffey, MD;  Location: [facility];  Service: Ophthalmology;  Laterality: Right;  . LASER PHOTO ABLATION Left 08/09/2017   Procedure: LASER PHOTO ABLATION;  Surgeon: Bernarda Caffey, MD;  Location: [facility];  Service: Ophthalmology;  Laterality: Left;  . SCLERAL BUCKLE WITH POSSIBLE 25 GAUGE PARS PLANA VITRECTOMY Right 08/09/2017   Procedure: SCLERAL BUCKLE WITH 25 GAUGE PARS PLANA VITRECTOMY AND ENDOLASER;  Surgeon: Bernarda Caffey, MD;  Location: [facility];  Service: Ophthalmology;  Laterality: Right;    FAMILY HISTORY Family History  Problem Relation Age of Onset  . Diabetes Mother   . Prostate cancer Father   . Hypertension Father   . Retinal detachment Father   . Lung cancer Paternal Grandfather   . Amblyopia Neg Hx   . Blindness Neg Hx   . Cataracts Neg Hx   . Glaucoma Neg Hx   . Macular degeneration Neg Hx   . Strabismus Neg Hx   . Retinitis pigmentosa Neg Hx     SOCIAL HISTORY Social History   Tobacco Use  . Smoking status: Never Smoker  . Smokeless tobacco: Never Used  Substance Use Topics  . Alcohol use: Yes    Alcohol/week: 0.0 standard drinks    Comment: occasional social use  . Drug use: No         OPHTHALMIC EXAM:  Base Eye Exam    Visual Acuity (Snellen - Linear)      Right Left   Dist cc 20/70 20/30 -2   Dist ph cc 20/40 20/20 -2   Correction:  Glasses  Very slow to read chart, questionable effort       Tonometry (Tonopen, 9:40 AM)      Right Left   Pressure 15 17       Pupils      Dark  Light Shape React APD   Right 4 3 Round Brisk None   Left 4 3 Round Brisk None       Visual Fields (Counting fingers)      Left Right    Full Full       Extraocular Movement      Right Left    Full, Ortho Full, Ortho       Neuro/Psych    Oriented x3:  Yes   Mood/Affect:  Normal       Dilation    Both eyes:  1.0% Mydriacyl, 2.5%  Phenylephrine @ 9:40 AM        Slit Lamp and Fundus Exam    External Exam      Right Left   External Normal        Slit Lamp Exam      Right Left   Lids/Lashes Meibomian gland dysfunction, Scurf Normal   Conjunctiva/Sclera Subconjunctival hemorrhage -- resolving, sutures dissolved White and quiet   Cornea  1+ diffuse Punctate epithelial erosions 1+ diffuse Punctate epithelial erosions   Anterior Chamber Deep Deep and quiet   Iris Round and well dilated Round and well dilated   Lens 2+ Nuclear sclerosis, 2+ Cortical cataract, 3+ Posterior subcapsular cataract 2-3+ Nuclear sclerosis with mild brunescence, 2+ Cortical cataract   Vitreous Post vitrectomy; gas bubble at ~2% Vitreous syneresis, Posterior vitreous detachment       Fundus Exam      Right Left   Disc Hazy view, Pink and Sharp, mildly Tilted disc Pink and Sharp, mildly Tilted disc   C/D Ratio 0.2 0.4   Macula Hazy view, Flat, blunted foveal reflex Flat, mild Retinal pigment epithelial mottling, No heme or edema   Vessels Normal Normal   Periphery hazy view; Retina attached, good buckle height, good laser 360 on buckle and surrounding breaks Attached, pigmented Lattice degeneration with atrophic holes at 0430; lattice at 1200 with atrophic break          IMAGING AND PROCEDURES  Imaging and Procedures for @TODAY @  OCT, Retina - OU - Both Eyes       Right Eye Quality was borderline. Central Foveal Thickness: 269. Progression has been stable. Findings include normal foveal contour, no IRF, no SRF.   Left Eye Quality was good. Central Foveal Thickness: 281. Progression has  been stable. Findings include normal foveal contour, no IRF, no SRF, vitreomacular adhesion .   Notes *Images captured and stored on drive  Diagnosis / Impression:  OD: NFP, No IRF/SRF, retina reattached OS: NFP, No IRF/SRF, VMA  Clinical management:  See below  Abbreviations: NFP - Normal foveal profile. CME - cystoid macular edema. PED - pigment epithelial detachment. IRF - intraretinal fluid. SRF - subretinal fluid. EZ - ellipsoid zone. ERM - epiretinal membrane. ORA - outer retinal atrophy. ORT - outer retinal tubulation. SRHM - subretinal hyper-reflective material                  ASSESSMENT/PLAN:    ICD-10-CM   1. 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 Retinal edema H35.81 OCT, Retina - OU - Both Eyes  5. Posterior vitreous detachment of both eyes H43.813   6. Combined forms of age-related cataract of both eyes H25.813     1. Rhegmatogenous retinal detachment, RIGHT eye - bullous superotemporal mac on detachment, onset of foveal involvement Saturday, 01/30/17 by history - detached from 1030-1200 oclock, large horseshoe tear from 1030-1100 and linear tear at 1130 within bed of lattice - POW 8 s/p SBP + PPV/EL/FAX/14% C3F8 OD, 06.10.19             - doing well, but significant post op PSC             - retina attached and in good position -- good buckle height and laser around breaks             - IOP good and  gas bubble gone-- okay to remove gas bracelet             - cont   PF taper -- 4,3,2,1 drops daily, decrease weekly  -   Alphagan to QD OD  -  PSO ung prn OD             - d/c avoid laying flat on backand looking up  - F/U as scheduled  2,3. Lattice degeneration OU;  w/ atrophic breaks left eye - OD - superotemporal lattice at site of breaks and detachment - OS - patches of lattice at 12 and 430 with a single  atrophic breaks at each site - s/p laser retinopexy OS at time of surgery OD - good laser in place - monitor  4. No retinal edema on exam or OCT  5. PVD / vitreous syneresis OU  Discussed findings and prognosis  RTs and RDs as above  6. Combined form age-related cataract OU-  - The symptoms of cataract, surgical options, and treatments and risks were discussed with patient. - discussed diagnosis and likely progression following PPV OD - monitor for now   Ophthalmic Meds Ordered this visit:  No orders of the defined types were placed in this encounter.      Return for as scheduled for POV.  There are no Patient Instructions on file for this visit.   Explained the diagnoses, plan, and follow up with the patient and they expressed understanding.  Patient expressed understanding of the importance of proper follow up care.   This document serves as a record of services personally performed by Gardiner Sleeper, MD, PhD. It was created on their behalf by Catha Brow, [place], a certified ophthalmic assistant. The creation of this record is the provider's dictation and/or activities during the visit.  Electronically signed by: Catha Brow, [place]  08.13.19 10:39 PM   Gardiner Sleeper, M.D., Ph.D. Diseases & Surgery of the Retina and Vitreous [facility]  I have reviewed the above documentation for accuracy and completeness, and I agree with the above. Gardiner Sleeper, M.D., Ph.D. 10/12/17 10:41 PM    Abbreviations: M myopia (nearsighted); A astigmatism; H hyperopia (farsighted); P presbyopia; Mrx spectacle prescription;  CTL contact lenses; OD right eye; OS left eye; OU both eyes  XT exotropia; ET esotropia; PEK punctate epithelial keratitis; PEE punctate epithelial erosions; DES dry eye syndrome; MGD meibomian gland dysfunction; ATs artificial tears; PFAT's preservative free artificial tears; [place] nuclear sclerotic cataract; PSC posterior subcapsular cataract;  ERM epi-retinal membrane; PVD posterior vitreous detachment; RD retinal detachment; DM diabetes mellitus; DR diabetic retinopathy; NPDR non-proliferative diabetic retinopathy; PDR proliferative diabetic retinopathy; CSME clinically significant macular edema; DME diabetic macular edema; dbh dot blot hemorrhages; CWS cotton wool spot; POAG primary open angle glaucoma; C/D cup-to-disc ratio; HVF humphrey visual field; GVF goldmann visual field; OCT optical coherence tomography; IOP intraocular pressure; BRVO Branch retinal vein occlusion; CRVO central retinal vein occlusion; CRAO central retinal artery occlusion; BRAO branch retinal artery occlusion; RT retinal tear; SB scleral buckle; PPV pars plana vitrectomy; VH Vitreous hemorrhage; PRP panretinal laser photocoagulation; IVK intravitreal kenalog; VMT vitreomacular traction; MH Macular hole;  NVD neovascularization of the disc; NVE neovascularization elsewhere; AREDS age related eye disease study; ARMD age related macular degeneration; POAG primary open angle glaucoma; EBMD epithelial/anterior basement membrane dystrophy; ACIOL anterior chamber intraocular lens; IOL intraocular lens; PCIOL posterior chamber intraocular lens; Phaco/IOL phacoemulsification with intraocular lens placement; PRK photorefractive keratectomy; LASIK laser assisted in  situ keratomileusis; HTN hypertension; DM diabetes mellitus; COPD chronic obstructive pulmonary disease 

## 2017-10-28 NOTE — Progress Notes (Signed)
[facility] Note  10/29/2017     CHIEF COMPLAINT Patient presents for Retina Follow Up   HISTORY OF PRESENT ILLNESS: Gail Duncan is a 52 y.o. 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  Severity is moderate.  Duration of 2 weeks.  Since onset it is stable.  I, the attending physician,  performed the HPI with the patient and updated documentation appropriately.          Comments    Pt presents for RD OS, pt states VA is the same since last visit, but she feels like it is sharper than before, pt states she has flashes on both eyes at night when she is trying to go to sleep, she denies pain, wavy vision or floaters, pt is using alphagan and pred forte once a day, but will stop those this monday       Last edited by Bernarda Caffey, MD on 10/29/2017  9:14 AM. (History)      Referring physician: Ma Hillock, DO [address] [place], [place] [address]  HISTORICAL INFORMATION:   Selected notes from the MEDICAL RECORD NUMBER Referred by ED for possible RD   CURRENT MEDICATIONS: Current Outpatient Medications (Ophthalmic Drugs)  Medication Sig  . atropine 1 % ophthalmic solution Place 1 drop into the right eye 2 (two) times daily at 10 am and 4 pm.  . bacitracin-polymyxin b (POLYSPORIN) ophthalmic ointment Place into the right eye 4 (four) times daily.  . brimonidine (ALPHAGAN) 0.2 % ophthalmic solution Place 1 drop into the right eye 2 (two) times daily.  . dorzolamide-timolol (COSOPT) 22.3-6.8 MG/ML ophthalmic solution Place 1 drop into the right eye 2 (two) times daily.  . prednisoLONE acetate (PRED FORTE) 1 % ophthalmic suspension Place 1 drop into the right eye 4 (four) times daily.   No current facility-administered medications for this visit.  (Ophthalmic Drugs)   Current Outpatient Medications (Other)  Medication Sig  . ANUCORT-HC 25 MG suppository Place  25 mg rectally 2 (two) times daily as needed for hemorrhoids or anal itching.   Marland Kitchen BIOTIN PO Take 1 capsule by mouth daily.  . chlorpheniramine (CHLOR-TRIMETON) 4 MG tablet Take 4 mg by mouth 2 (two) times daily as needed for allergies.  . Cholecalciferol (VITAMIN D3) 5000 units CAPS Take 5,000 capsules by mouth 3 (three) times a week.   . Coenzyme Q10-Vitamin E (COQ10-VITAMIN E) 100-10 MG-UNIT CAPS Take 1 capsule by mouth daily.   . Flaxseed, Linseed, (FLAXSEED OIL PO) Take 15 mLs by mouth daily.   Marland Kitchen HYDROcodone-acetaminophen (NORCO/VICODIN) 5-325 MG tablet Take 1 tablet by mouth every 4 (four) hours as needed for moderate pain.  Marland Kitchen omeprazole (PRILOSEC) 40 MG capsule Take 40 mg by mouth 2 (two) times daily.  . pantoprazole (PROTONIX) 40 MG tablet Take 40 mg by mouth 2 (two) times daily.   No current facility-administered medications for this visit.  (Other)      REVIEW OF SYSTEMS: ROS    Positive for: Eyes, Heme/Lymph   Negative for: Constitutional, Gastrointestinal, Neurological, Skin, Genitourinary, Musculoskeletal, HENT, Endocrine, Cardiovascular, Respiratory, Psychiatric, Allergic/Imm   Last edited by Debbrah Alar, COT on 10/29/2017  8:51 AM. (History)       ALLERGIES Allergies  Allergen Reactions  . Asa [Aspirin] Other (See Comments)    Contraindicated due to medical condition  PAST MEDICAL HISTORY Past Medical History:  Diagnosis Date  . Anemia   . GERD (gastroesophageal reflux disease)   . Hearing loss in right ear    Unknown cause, had full work up with Specialist in [place]  . History of chicken pox    Past Surgical History:  Procedure Laterality Date  . ABDOMINAL EXPLORATION SURGERY    . ABLATION    . DENTAL SURGERY    . EXPLORATORY LAPAROTOMY     "fibrous"  . GAS INSERTION Right 08/09/2017   Procedure: INSERTION OF GAS;  Surgeon: Bernarda Caffey, MD;  Location: [facility];  Service: Ophthalmology;  Laterality: Right;  . LASER PHOTO ABLATION Left 08/09/2017    Procedure: LASER PHOTO ABLATION;  Surgeon: Bernarda Caffey, MD;  Location: [facility];  Service: Ophthalmology;  Laterality: Left;  . SCLERAL BUCKLE WITH POSSIBLE 25 GAUGE PARS PLANA VITRECTOMY Right 08/09/2017   Procedure: SCLERAL BUCKLE WITH 25 GAUGE PARS PLANA VITRECTOMY AND ENDOLASER;  Surgeon: Bernarda Caffey, MD;  Location: [facility];  Service: Ophthalmology;  Laterality: Right;    FAMILY HISTORY Family History  Problem Relation Age of Onset  . Diabetes Mother   . Prostate cancer Father   . Hypertension Father   . Retinal detachment Father   . Lung cancer Paternal Grandfather   . Amblyopia Neg Hx   . Blindness Neg Hx   . Cataracts Neg Hx   . Glaucoma Neg Hx   . Macular degeneration Neg Hx   . Strabismus Neg Hx   . Retinitis pigmentosa Neg Hx     SOCIAL HISTORY Social History   Tobacco Use  . Smoking status: Never Smoker  . Smokeless tobacco: Never Used  Substance Use Topics  . Alcohol use: Yes    Alcohol/week: 0.0 standard drinks    Comment: occasional social use  . Drug use: No         OPHTHALMIC EXAM:  Base Eye Exam    Visual Acuity (Snellen - Linear)      Right Left   Dist cc 20/150 -1 20/25 -2   Dist ph cc 20/60 -2 20/20 -2   Correction:  Glasses       Tonometry (Tonopen, 9:00 AM)      Right Left   Pressure 14 17       Pupils      Dark Light Shape React APD   Right 4 3 Round Brisk None   Left 4 3 Round Brisk None       Visual Fields (Counting fingers)      Left Right    Full Full       Neuro/Psych    Oriented x3:  Yes   Mood/Affect:  Normal       Dilation    Both eyes:  1.0% Mydriacyl, 2.5% Phenylephrine @ 9:00 AM        Slit Lamp and Fundus Exam    External Exam      Right Left   External Normal        Slit Lamp Exam      Right Left   Lids/Lashes Meibomian gland dysfunction, Scurf Normal   Conjunctiva/Sclera Trace residual Subconjunctival hemorrhage, sutures dissolved White and quiet   Cornea  1-2+ diffuse Punctate epithelial erosions  1+ diffuse Punctate epithelial erosions   Anterior Chamber Deep Deep and quiet   Iris Round and well dilated Round and well dilated   Lens 1-2+ Nuclear sclerosis, 2-3+ Cortical cataract, 3-4+ Posterior subcapsular cataract 2-3+ Nuclear sclerosis with  mild brunescence, 2+ Cortical cataract   Vitreous Post vitrectomy; gas bubble resolved Vitreous syneresis, Posterior vitreous detachment       Fundus Exam      Right Left   Disc Hazy view, Pink and Sharp, mildly Tilted disc    C/D Ratio 0.3 0.4   Macula Attached, Good foveal reflex    Vessels Normal    Periphery hazy view; Retina attached, good buckle height, good laser 360 on buckle and surrounding breaks           IMAGING AND PROCEDURES  Imaging and Procedures for _0 @  OCT, Retina - OU - Both Eyes       Right Eye Quality was poor. Central Foveal Thickness: 303. Progression has been stable. Findings include normal foveal contour, no IRF, no SRF.   Left Eye Quality was good. Central Foveal Thickness: 287. Progression has been stable. Findings include normal foveal contour, no IRF, no SRF, vitreomacular adhesion .   Notes *Images captured and stored on drive  Diagnosis / Impression:  OD: NFP, No IRF/SRF, retina reattached OS: NFP, No IRF/SRF, VMA  Clinical management:  See below  Abbreviations: NFP - Normal foveal profile. CME - cystoid macular edema. PED - pigment epithelial detachment. IRF - intraretinal fluid. SRF - subretinal fluid. EZ - ellipsoid zone. ERM - epiretinal membrane. ORA - outer retinal atrophy. ORT - outer retinal tubulation. SRHM - subretinal hyper-reflective material                  ASSESSMENT/PLAN:    ICD-10-CM   1. 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 Retinal edema H35.81 OCT, Retina - OU - Both Eyes  5. Posterior vitreous detachment of both  eyes H43.813   6. Combined forms of age-related cataract of both eyes H25.813     1. Rhegmatogenous retinal detachment, RIGHT eye - bullous superotemporal mac on detachment, onset of foveal involvement Saturday, 01/30/17 by history - detached from 1030-1200 oclock, large horseshoe tear from 1030-1100 and linear tear at 1130 within bed of lattice - POM 2 s/p SBP + PPV/EL/FAX/14% C3F8 OD, 06.10.19             - doing well, but significant post op PSC             - retina attached and in good position -- good buckle height and laser around breaks             - IOP good and gas bubble gone             - cont   PF taper -- 4,3,2,1 drops daily, decrease weekly (currently at once daily dosing)  - STOP Alphagan  - PSO ung prn OD  - F/U 4-6 weeks, DFE OU, OCT  2,3. Lattice degeneration OU;  w/ atrophic breaks left eye - OD - superotemporal lattice at site of breaks and detachment - OS - patches of lattice at 12 and 430 with a single atrophic breaks at each site - s/p laser retinopexy OS at time of surgery OD - good laser in place - monitor  4. No retinal edema on exam or OCT  5. PVD / vitreous syneresis OU  Discussed findings and prognosis  RTs and RDs as above  6. Combined form age-related cataract OU-  - The symptoms of cataract, surgical options, and  treatments and risks were discussed with patient. - discussed diagnosis and likely progression following PPV OD - significant post op PSC OD limiting vision - will refer to Dr. Herbert Deaner for cataract eval and management   Ophthalmic Meds Ordered this visit:  No orders of the defined types were placed in this encounter.      Return in about 5 weeks (around 12/03/2017) for F/U RD repar OD, DFE OU, OCT.  There are no Patient Instructions on file for this visit.   Explained the diagnoses, plan, and follow up with the patient and they expressed understanding.  Patient expressed understanding of the importance of proper follow up care.    This document serves as a record of services personally performed by Gardiner Sleeper, MD, PhD. It was created on their behalf by Ernest Mallick, OA, an ophthalmic assistant. The creation of this record is the provider's dictation and/or activities during the visit.    Electronically signed by: Ernest Mallick, OA  08.29.2019 12:19 PM   This document serves as a record of services personally performed by Gardiner Sleeper, MD, PhD. It was created on their behalf by Catha Brow, [place], a certified ophthalmic assistant. The creation of this record is the provider's dictation and/or activities during the visit.  Electronically signed by: Catha Brow, COA  08.30.19 12:19 PM    Gardiner Sleeper, M.D., Ph.D. Diseases & Surgery of the Retina and Vitreous [facility]   I have reviewed the above documentation for accuracy and completeness, and I agree with the above. Gardiner Sleeper, M.D., Ph.D. 10/29/17 12:32 PM     Abbreviations: M myopia (nearsighted); A astigmatism; H hyperopia (farsighted); P presbyopia; Mrx spectacle prescription;  CTL contact lenses; OD right eye; OS left eye; OU both eyes  XT exotropia; ET esotropia; PEK punctate epithelial keratitis; PEE punctate epithelial erosions; DES dry eye syndrome; MGD meibomian gland dysfunction; ATs artificial tears; PFAT's preservative free artificial tears; [place] nuclear sclerotic cataract; PSC posterior subcapsular cataract; ERM epi-retinal membrane; PVD posterior vitreous detachment; RD retinal detachment; DM diabetes mellitus; DR diabetic retinopathy; NPDR non-proliferative diabetic retinopathy; PDR proliferative diabetic retinopathy; CSME clinically significant macular edema; DME diabetic macular edema; dbh dot blot hemorrhages; CWS cotton wool spot; POAG primary open angle glaucoma; C/D cup-to-disc ratio; HVF humphrey visual field; GVF goldmann visual field; OCT optical coherence tomography; IOP intraocular pressure; BRVO  Branch retinal vein occlusion; CRVO central retinal vein occlusion; CRAO central retinal artery occlusion; BRAO branch retinal artery occlusion; RT retinal tear; SB scleral buckle; PPV pars plana vitrectomy; VH Vitreous hemorrhage; PRP panretinal laser photocoagulation; IVK intravitreal kenalog; VMT vitreomacular traction; MH Macular hole;  NVD neovascularization of the disc; NVE neovascularization elsewhere; AREDS age related eye disease study; ARMD age related macular degeneration; POAG primary open angle glaucoma; EBMD epithelial/anterior basement membrane dystrophy; ACIOL anterior chamber intraocular lens; IOL intraocular lens; PCIOL posterior chamber intraocular lens; Phaco/IOL phacoemulsification with intraocular lens placement; [place] photorefractive keratectomy; LASIK laser assisted in situ keratomileusis; HTN hypertension; DM diabetes mellitus; COPD chronic obstructive pulmonary disease

## 2017-10-29 ENCOUNTER — Encounter (INDEPENDENT_AMBULATORY_CARE_PROVIDER_SITE_OTHER): Payer: Self-pay | Admitting: Ophthalmology

## 2017-10-29 ENCOUNTER — Ambulatory Visit (INDEPENDENT_AMBULATORY_CARE_PROVIDER_SITE_OTHER): Payer: Federal, State, Local not specified - PPO | Admitting: Ophthalmology

## 2017-10-29 DIAGNOSIS — H25813 Combined forms of age-related cataract, bilateral: Secondary | ICD-10-CM

## 2017-10-29 DIAGNOSIS — H3581 Retinal edema: Secondary | ICD-10-CM

## 2017-10-29 DIAGNOSIS — H3321 Serous retinal detachment, right eye: Secondary | ICD-10-CM | POA: Diagnosis not present

## 2017-10-29 DIAGNOSIS — H35413 Lattice degeneration of retina, bilateral: Secondary | ICD-10-CM

## 2017-10-29 DIAGNOSIS — H43813 Vitreous degeneration, bilateral: Secondary | ICD-10-CM

## 2017-10-29 DIAGNOSIS — H33332 Multiple defects of retina without detachment, left eye: Secondary | ICD-10-CM

## 2017-11-02 ENCOUNTER — Encounter (INDEPENDENT_AMBULATORY_CARE_PROVIDER_SITE_OTHER): Payer: Federal, State, Local not specified - PPO | Admitting: Ophthalmology

## 2017-11-15 DIAGNOSIS — H2511 Age-related nuclear cataract, right eye: Secondary | ICD-10-CM | POA: Diagnosis not present

## 2017-11-15 DIAGNOSIS — H25013 Cortical age-related cataract, bilateral: Secondary | ICD-10-CM | POA: Diagnosis not present

## 2017-11-15 DIAGNOSIS — H2513 Age-related nuclear cataract, bilateral: Secondary | ICD-10-CM | POA: Diagnosis not present

## 2017-11-15 DIAGNOSIS — H35413 Lattice degeneration of retina, bilateral: Secondary | ICD-10-CM | POA: Diagnosis not present

## 2017-11-15 DIAGNOSIS — H25041 Posterior subcapsular polar age-related cataract, right eye: Secondary | ICD-10-CM | POA: Diagnosis not present

## 2017-11-15 DIAGNOSIS — H33001 Unspecified retinal detachment with retinal break, right eye: Secondary | ICD-10-CM | POA: Diagnosis not present

## 2017-11-16 DIAGNOSIS — K080 Exfoliation of teeth due to systemic causes: Secondary | ICD-10-CM | POA: Diagnosis not present

## 2017-11-23 DIAGNOSIS — H2511 Age-related nuclear cataract, right eye: Secondary | ICD-10-CM | POA: Diagnosis not present

## 2017-11-23 DIAGNOSIS — H25811 Combined forms of age-related cataract, right eye: Secondary | ICD-10-CM | POA: Diagnosis not present

## 2017-11-23 HISTORY — PX: CATARACT EXTRACTION: SUR2

## 2017-11-23 HISTORY — PX: EYE SURGERY: SHX253

## 2017-12-02 NOTE — Progress Notes (Signed)
[facility] Note  12/03/2017     CHIEF COMPLAINT Patient presents for Post-op Follow-up   HISTORY OF PRESENT ILLNESS: Gail Duncan is a 52 y.o. female who presents to the clinic today for:   HPI    Post-op Follow-up    In right eye.  Vision is stable.  I, the attending physician,  performed the HPI with the patient and updated documentation appropriately.          Comments    Pt presents of RD f/u (06.10.19), pt states she had cataract sx 9/24 OD performed by Dr. Herbert Deaner, pt states she had her vision set so that she can see the computer and has to correct the distance with glasses, pt states she has an appointment on October 21st to get glasses, pt states Dr. Herbert Deaner said she has a thin layer of scar tissue over her eye and she was going to defer to Dr. Coralyn Pear about whether or not to remove it, pt states her vision has improved since last visit, pt states she sees floaters only when out in the sun, she states they are smaller than a pin head, black with clear centers, she sees flashes at night occasionally, she denies pain, pt is using Ketorolac QID and PF TID as of this week, pt is also using soothe for dry eyes        Last edited by Bernarda Caffey, MD on 12/03/2017  3:00 PM. (History)      Referring physician: Ma Hillock, DO [address] [place], [place] [address]  HISTORICAL INFORMATION:   Selected notes from the MEDICAL RECORD NUMBER Referred by ED for possible RD   CURRENT MEDICATIONS: Current Outpatient Medications (Ophthalmic Drugs)  Medication Sig  . atropine 1 % ophthalmic solution Place 1 drop into the right eye 2 (two) times daily at 10 am and 4 pm.  . bacitracin-polymyxin b (POLYSPORIN) ophthalmic ointment Place into the right eye 4 (four) times daily.  . brimonidine (ALPHAGAN) 0.2 % ophthalmic solution Place 1 drop into the right eye 2 (two) times daily.  . dorzolamide-timolol (COSOPT) 22.3-6.8 MG/ML ophthalmic solution Place 1 drop into  the right eye 2 (two) times daily.  Marland Kitchen ketorolac (ACULAR) 0.5 % ophthalmic solution   . prednisoLONE acetate (PRED FORTE) 1 % ophthalmic suspension Place 1 drop into the right eye 4 (four) times daily.   No current facility-administered medications for this visit.  (Ophthalmic Drugs)   Current Outpatient Medications (Other)  Medication Sig  . ANUCORT-HC 25 MG suppository Place 25 mg rectally 2 (two) times daily as needed for hemorrhoids or anal itching.   Marland Kitchen BIOTIN PO Take 1 capsule by mouth daily.  . chlorpheniramine (CHLOR-TRIMETON) 4 MG tablet Take 4 mg by mouth 2 (two) times daily as needed for allergies.  . Cholecalciferol (VITAMIN D3) 5000 units CAPS Take 5,000 capsules by mouth 3 (three) times a week.   . Coenzyme Q10-Vitamin E (COQ10-VITAMIN E) 100-10 MG-UNIT CAPS Take 1 capsule by mouth daily.   . Flaxseed, Linseed, (FLAXSEED OIL PO) Take 15 mLs by mouth daily.   Marland Kitchen HYDROcodone-acetaminophen (NORCO/VICODIN) 5-325 MG tablet Take 1 tablet by mouth every 4 (four) hours as needed for moderate pain.  Marland Kitchen omeprazole (PRILOSEC) 40 MG capsule Take 40 mg by mouth 2 (two) times daily.  . pantoprazole (PROTONIX) 40 MG tablet Take 40 mg by mouth 2 (two) times daily.   No current facility-administered medications for this visit.  (Other)  REVIEW OF SYSTEMS: ROS    Positive for: Cardiovascular, Eyes   Negative for: Constitutional, Gastrointestinal, Neurological, Skin, Genitourinary, Musculoskeletal, HENT, Endocrine, Respiratory, Psychiatric, Allergic/Imm, Heme/Lymph   Last edited by Debbrah Alar, COT on 12/03/2017  2:01 PM. (History)       ALLERGIES Allergies  Allergen Reactions  . Asa [Aspirin] Other (See Comments)    Contraindicated due to medical condition    PAST MEDICAL HISTORY Past Medical History:  Diagnosis Date  . Anemia   . GERD (gastroesophageal reflux disease)   . Hearing loss in right ear    Unknown cause, had full work up with Specialist in [place]  . History of  chicken pox    Past Surgical History:  Procedure Laterality Date  . ABDOMINAL EXPLORATION SURGERY    . ABLATION    . DENTAL SURGERY    . EXPLORATORY LAPAROTOMY     "fibrous"  . GAS INSERTION Right 08/09/2017   Procedure: INSERTION OF GAS;  Surgeon: Bernarda Caffey, MD;  Location: [facility];  Service: Ophthalmology;  Laterality: Right;  . LASER PHOTO ABLATION Left 08/09/2017   Procedure: LASER PHOTO ABLATION;  Surgeon: Bernarda Caffey, MD;  Location: [facility];  Service: Ophthalmology;  Laterality: Left;  . SCLERAL BUCKLE WITH POSSIBLE 25 GAUGE PARS PLANA VITRECTOMY Right 08/09/2017   Procedure: SCLERAL BUCKLE WITH 25 GAUGE PARS PLANA VITRECTOMY AND ENDOLASER;  Surgeon: Bernarda Caffey, MD;  Location: [facility];  Service: Ophthalmology;  Laterality: Right;    FAMILY HISTORY Family History  Problem Relation Age of Onset  . Diabetes Mother   . Prostate cancer Father   . Hypertension Father   . Retinal detachment Father   . Lung cancer Paternal Grandfather   . Amblyopia Neg Hx   . Blindness Neg Hx   . Cataracts Neg Hx   . Glaucoma Neg Hx   . Macular degeneration Neg Hx   . Strabismus Neg Hx   . Retinitis pigmentosa Neg Hx     SOCIAL HISTORY Social History   Tobacco Use  . Smoking status: Never Smoker  . Smokeless tobacco: Never Used  Substance Use Topics  . Alcohol use: Yes    Alcohol/week: 0.0 standard drinks    Comment: occasional social use  . Drug use: No         OPHTHALMIC EXAM:  Base Eye Exam    Visual Acuity (Snellen - Linear)      Right Left   Dist [place] 20/50 -2    Dist cc  20/40 -2   Dist ph [place] 20/50 +1    Dist ph cc  20/25 -2       Tonometry (Tonopen, 2:14 PM)      Right Left   Pressure 24 18       Tonometry #2 (Tonopen, 3:00 PM)      Right Left   Pressure 25        Pupils      Dark Light Shape React APD   Right 5 3 Round Brisk None   Left 5 3 Round Brisk None       Visual Fields (Counting fingers)      Left Right    Full Full       Extraocular  Movement      Right Left    Full, Ortho Full, Ortho       Neuro/Psych    Oriented x3:  Yes   Mood/Affect:  Normal       Dilation    Both  eyes:  1.0% Mydriacyl, 2.5% Phenylephrine @ 2:14 PM        Slit Lamp and Fundus Exam    External Exam      Right Left   External Normal        Slit Lamp Exam      Right Left   Lids/Lashes Meibomian gland dysfunction, [place] Normal   Conjunctiva/Sclera Trace residual Subconjunctival hemorrhage, sutures dissolved White and quiet   Cornea  2-3+ diffuse Punctate epithelial erosions, Well healed cataract wound at 1030 1+ diffuse Punctate epithelial erosions   Anterior Chamber Deep Deep and quiet   Iris Round and well dilated Round and well dilated   Lens PC IOL in good position, 3+ fibrotic PCO, pigment deposition on anterior capsule  2-3+ Nuclear sclerosis with mild brunescence, 2+ Cortical cataract   Vitreous Post vitrectomy; Vitreous syneresis, Posterior vitreous detachment       Fundus Exam      Right Left   Disc Hazy view, Pink and Sharp, mildly Tilted disc Pink and Sharp, mildly Tilted disc   C/D Ratio 0.3 0.4   Macula Attached, Flat, Good foveal reflex, Epiretinal membrane Flat, mild Retinal pigment epithelial mottling, No heme or edema   Vessels Normal Normal   Periphery hazy view; Retina attached, good buckle height, good laser 360 on buckle and surrounding breaks Attached, pigmented Lattice degeneration with atrophic holes at 0430; lattice at 1200 with atrophic break; good laser in place surrounding all lesions          IMAGING AND PROCEDURES  Imaging and Procedures for _0 @  OCT, Retina - OU - Both Eyes       Right Eye Quality was good. Central Foveal Thickness: 319. Progression has been stable. Findings include normal foveal contour, no IRF, no SRF, epiretinal membrane.   Left Eye Quality was good. Central Foveal Thickness: 288. Progression has been stable. Findings include normal foveal contour, no IRF, no SRF,  vitreomacular adhesion .   Notes *Images captured and stored on drive  Diagnosis / Impression:  OD: No IRF/SRF, retina reattached; mild ERM OS: NFP, No IRF/SRF, VMA  Clinical management:  See below  Abbreviations: NFP - Normal foveal profile. CME - cystoid macular edema. PED - pigment epithelial detachment. IRF - intraretinal fluid. SRF - subretinal fluid. EZ - ellipsoid zone. ERM - epiretinal membrane. ORA - outer retinal atrophy. ORT - outer retinal tubulation. SRHM - subretinal hyper-reflective material                  ASSESSMENT/PLAN:    ICD-10-CM   1. 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 Retinal edema H35.81 OCT, Retina - OU - Both Eyes  5. Posterior vitreous detachment of both eyes H43.813   6. 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 Pseudophakia of right eye Z96.1     1. 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 Herbert Deaner on 9.24.19             -  retina attached and in good position -- good buckle height and laser around breaks             - IOP good and gas bubble gone  - re-start Brimonidine OD QD             - cont   post op cataract gtts per Dr. Herbert Deaner  - F/U 2 months  2,3. Lattice degeneration OU;  w/ atrophic breaks left eye - OD - superotemporal lattice at site of breaks and detachment - OS - patches of lattice at 12 and 430 with a single atrophic breaks at each site - s/p laser retinopexy OS at time of surgery OD - good laser in place - monitor  4. No retinal edema on  exam or OCT  5. PVD / vitreous syneresis OU  Discussed findings and prognosis  RTs and RDs as above  6. Pseudophakia OD  - cataract OD progressively worsened following PPV  - s/p CE/IOL by expert surgeon Dr. Herbert Deaner on 09.24.19  - beautiful surgery, doing well  - currently on PF OD TID, ketorolac OD TID  - monitor  7. Combined form age-related cataract OS-  - The symptoms of cataract, surgical options, and treatments and risks were discussed with patient. - discussed diagnosis and progression - under the expert care of Dr. Herbert Deaner - pt wishes to hold off on CE/IOL OS for now    Ophthalmic Meds Ordered this visit:  No orders of the defined types were placed in this encounter.      Return in about 2 months (around 02/02/2018) for F/U RD repair OD, DFE, OCT.  There are no Patient Instructions on file for this visit.   Explained the diagnoses, plan, and follow up with the patient and they expressed understanding.  Patient expressed understanding of the importance of proper follow up care.   This document serves as a record of services personally performed by Gardiner Sleeper, MD, PhD. It was created on their behalf by Ernest Mallick, OA, an ophthalmic assistant. The creation of this record is the provider's dictation and/or activities during the visit.    Electronically signed by: Ernest Mallick, OA  10.03.19 12:52 AM    Gardiner Sleeper, M.D., Ph.D. Diseases & Surgery of the Retina and Vitreous [facility]   I have reviewed the above documentation for accuracy and completeness, and I agree with the above. Gardiner Sleeper, M.D., Ph.D. 12/05/17 12:59 AM   Abbreviations: M myopia (nearsighted); A astigmatism; H hyperopia (farsighted); P presbyopia; Mrx spectacle prescription;  CTL contact lenses; OD right eye; OS left eye; OU both eyes  XT exotropia; ET esotropia; PEK punctate epithelial keratitis; PEE punctate epithelial erosions; DES dry eye syndrome; MGD  meibomian gland dysfunction; ATs artificial tears; PFAT's preservative free artificial tears; [place] nuclear sclerotic cataract; PSC posterior subcapsular cataract; ERM epi-retinal membrane; PVD posterior vitreous detachment; RD retinal detachment; DM diabetes mellitus; DR diabetic retinopathy; NPDR non-proliferative diabetic retinopathy; PDR proliferative diabetic retinopathy; CSME clinically significant macular edema; DME diabetic macular edema; dbh dot blot hemorrhages; CWS cotton wool spot; POAG primary open angle glaucoma; C/D cup-to-disc ratio; HVF humphrey visual field; GVF goldmann visual field; OCT optical coherence tomography; IOP intraocular pressure; BRVO Branch retinal vein occlusion; CRVO central retinal vein occlusion; CRAO central retinal artery occlusion; BRAO branch retinal artery occlusion; RT retinal tear; SB scleral buckle; PPV pars plana vitrectomy; VH Vitreous hemorrhage; PRP panretinal laser photocoagulation; IVK intravitreal kenalog; VMT vitreomacular traction; MH  Macular hole;  NVD neovascularization of the disc; NVE neovascularization elsewhere; AREDS age related eye disease study; ARMD age related macular degeneration; POAG primary open angle glaucoma; EBMD epithelial/anterior basement membrane dystrophy; ACIOL anterior chamber intraocular lens; IOL intraocular lens; PCIOL posterior chamber intraocular lens; Phaco/IOL phacoemulsification with intraocular lens placement; [place] photorefractive keratectomy; LASIK laser assisted in situ keratomileusis; HTN hypertension; DM diabetes mellitus; COPD chronic obstructive pulmonary disease

## 2017-12-03 ENCOUNTER — Ambulatory Visit (INDEPENDENT_AMBULATORY_CARE_PROVIDER_SITE_OTHER): Payer: Federal, State, Local not specified - PPO | Admitting: Ophthalmology

## 2017-12-03 ENCOUNTER — Encounter (INDEPENDENT_AMBULATORY_CARE_PROVIDER_SITE_OTHER): Payer: Self-pay | Admitting: Ophthalmology

## 2017-12-03 DIAGNOSIS — H3321 Serous retinal detachment, right eye: Secondary | ICD-10-CM

## 2017-12-03 DIAGNOSIS — H3581 Retinal edema: Secondary | ICD-10-CM | POA: Diagnosis not present

## 2017-12-03 DIAGNOSIS — H35413 Lattice degeneration of retina, bilateral: Secondary | ICD-10-CM | POA: Diagnosis not present

## 2017-12-03 DIAGNOSIS — H33332 Multiple defects of retina without detachment, left eye: Secondary | ICD-10-CM

## 2017-12-03 DIAGNOSIS — H43813 Vitreous degeneration, bilateral: Secondary | ICD-10-CM

## 2017-12-03 DIAGNOSIS — Z961 Presence of intraocular lens: Secondary | ICD-10-CM

## 2017-12-03 DIAGNOSIS — H25812 Combined forms of age-related cataract, left eye: Secondary | ICD-10-CM

## 2017-12-05 ENCOUNTER — Encounter (INDEPENDENT_AMBULATORY_CARE_PROVIDER_SITE_OTHER): Payer: Self-pay | Admitting: Ophthalmology

## 2017-12-30 DIAGNOSIS — Z961 Presence of intraocular lens: Secondary | ICD-10-CM | POA: Diagnosis not present

## 2017-12-30 DIAGNOSIS — H33011 Retinal detachment with single break, right eye: Secondary | ICD-10-CM | POA: Diagnosis not present

## 2018-02-01 NOTE — Progress Notes (Addendum)
[facility] Note  02/02/2018     CHIEF COMPLAINT Patient presents for Retina Follow Up   HISTORY OF PRESENT ILLNESS: Gail Duncan is a 52 y.o. female who presents to the clinic today for:   HPI    Retina Follow Up    Patient presents with  Retinal Break/Detachment.  In right eye.  I, the attending physician,  performed the HPI with the patient and updated documentation appropriately.          Comments    F/U RD OD. (08/09/17). Patient states she has soreness above and underneath OD, she has occasional floaters OD, denies new onsets/issues. Pt reports she received new glasses and vision has improved. Pt is not using PF gtt's at this time.        Last edited by Bernarda Caffey, MD on 02/02/2018  4:13 PM. (History)    Patient states vision improved.  Referring physician: Ma Hillock, DO [address] [place], [place] [address]  HISTORICAL INFORMATION:   Selected notes from the MEDICAL RECORD NUMBER Referred by ED for possible RD   CURRENT MEDICATIONS: Current Outpatient Medications (Ophthalmic Drugs)  Medication Sig  . atropine 1 % ophthalmic solution Place 1 drop into the right eye 2 (two) times daily at 10 am and 4 pm.  . bacitracin-polymyxin b (POLYSPORIN) ophthalmic ointment Place into the right eye 4 (four) times daily.  . brimonidine (ALPHAGAN) 0.2 % ophthalmic solution Place 1 drop into the right eye 2 (two) times daily.  . dorzolamide-timolol (COSOPT) 22.3-6.8 MG/ML ophthalmic solution Place 1 drop into the right eye 2 (two) times daily.  Marland Kitchen ketorolac (ACULAR) 0.5 % ophthalmic solution   . prednisoLONE acetate (PRED FORTE) 1 % ophthalmic suspension Place 1 drop into the right eye 4 (four) times daily.   No current facility-administered medications for this visit.  (Ophthalmic Drugs)   Current Outpatient Medications (Other)  Medication Sig  . ANUCORT-HC 25 MG suppository Place 25 mg rectally 2 (two) times daily as needed for hemorrhoids or  anal itching.   Marland Kitchen BIOTIN PO Take 1 capsule by mouth daily.  . chlorpheniramine (CHLOR-TRIMETON) 4 MG tablet Take 4 mg by mouth 2 (two) times daily as needed for allergies.  . Cholecalciferol (VITAMIN D3) 5000 units CAPS Take 5,000 capsules by mouth 3 (three) times a week.   . Coenzyme Q10-Vitamin E (COQ10-VITAMIN E) 100-10 MG-UNIT CAPS Take 1 capsule by mouth daily.   . Flaxseed, Linseed, (FLAXSEED OIL PO) Take 15 mLs by mouth daily.   Marland Kitchen HYDROcodone-acetaminophen (NORCO/VICODIN) 5-325 MG tablet Take 1 tablet by mouth every 4 (four) hours as needed for moderate pain.  Marland Kitchen omeprazole (PRILOSEC) 40 MG capsule Take 40 mg by mouth 2 (two) times daily.  . pantoprazole (PROTONIX) 40 MG tablet Take 40 mg by mouth 2 (two) times daily.   No current facility-administered medications for this visit.  (Other)      REVIEW OF SYSTEMS: ROS    Positive for: Eyes   Negative for: Constitutional, Gastrointestinal, Neurological, Skin, Genitourinary, Musculoskeletal, HENT, Endocrine, Cardiovascular, Respiratory, Psychiatric, Allergic/Imm, Heme/Lymph   Last edited by Zenovia Jordan, LPN on 62/11/4763  4:65 PM. (History)       ALLERGIES Allergies  Allergen Reactions  . Asa [Aspirin] Other (See Comments)    Contraindicated due to medical condition    PAST MEDICAL HISTORY Past Medical History:  Diagnosis Date  . Anemia   . GERD (gastroesophageal reflux disease)   . Hearing loss in  right ear    Unknown cause, had full work up with Specialist in [place]  . History of chicken pox    Past Surgical History:  Procedure Laterality Date  . ABDOMINAL EXPLORATION SURGERY    . ABLATION    . DENTAL SURGERY    . EXPLORATORY LAPAROTOMY     "fibrous"  . GAS INSERTION Right 08/09/2017   Procedure: INSERTION OF GAS;  Surgeon: Bernarda Caffey, MD;  Location: [facility];  Service: Ophthalmology;  Laterality: Right;  . LASER PHOTO ABLATION Left 08/09/2017   Procedure: LASER PHOTO ABLATION;  Surgeon: Bernarda Caffey, MD;   Location: [facility];  Service: Ophthalmology;  Laterality: Left;  . SCLERAL BUCKLE WITH POSSIBLE 25 GAUGE PARS PLANA VITRECTOMY Right 08/09/2017   Procedure: SCLERAL BUCKLE WITH 25 GAUGE PARS PLANA VITRECTOMY AND ENDOLASER;  Surgeon: Bernarda Caffey, MD;  Location: [facility];  Service: Ophthalmology;  Laterality: Right;    FAMILY HISTORY Family History  Problem Relation Age of Onset  . Diabetes Mother   . Prostate cancer Father   . Hypertension Father   . Retinal detachment Father   . Lung cancer Paternal Grandfather   . Amblyopia Neg Hx   . Blindness Neg Hx   . Cataracts Neg Hx   . Glaucoma Neg Hx   . Macular degeneration Neg Hx   . Strabismus Neg Hx   . Retinitis pigmentosa Neg Hx     SOCIAL HISTORY Social History   Tobacco Use  . Smoking status: Never Smoker  . Smokeless tobacco: Never Used  Substance Use Topics  . Alcohol use: Yes    Alcohol/week: 0.0 standard drinks    Comment: occasional social use  . Drug use: No         OPHTHALMIC EXAM:  Base Eye Exam    Visual Acuity (Snellen - Linear)      Right Left   Dist cc 20/40 20/30 -1   Dist ph cc NI 20/25   Correction:  Glasses       Tonometry (Tonopen, 2:42 PM)      Right Left   Pressure 15 12       Pupils      Dark Light Shape React APD   Right 4 3 Round Brisk None   Left 3 2 Round Brisk None       Visual Fields (Counting fingers)      Left Right    Full Full       Extraocular Movement      Right Left    Full, Ortho Full, Ortho       Neuro/Psych    Oriented x3:  Yes   Mood/Affect:  Normal       Dilation    Both eyes:  1.0% Mydriacyl, 2.5% Phenylephrine @ 2:42 PM        Slit Lamp and Fundus Exam    External Exam      Right Left   External Normal        Slit Lamp Exam      Right Left   Lids/Lashes Meibomian gland dysfunction, Scurf Normal   Conjunctiva/Sclera White and quiet White and quiet   Cornea  1+2+ inferior Punctate epithelial erosions, Well healed cataract wound at 1030 1+ diffuse  Punctate epithelial erosions   Anterior Chamber Deep Deep and quiet   Iris Round and well dilated Round and well dilated   Lens PC IOL in good position, 3+ fibrotic PCO--ring-like with pigment deposition on capsule  2-3+ Nuclear sclerosis  with mild brunescence, 2+ Cortical cataract   Vitreous Post vitrectomy Vitreous syneresis, Posterior vitreous detachment, condensations centrally over macula       Fundus Exam      Right Left   Disc Hazy view, Pink and Sharp, mildly Tilted disc Pink and Sharp, mildly Tilted disc   C/D Ratio 0.2 0.4   Macula Attached, Flat, blunted foveal reflex, Epiretinal membrane Flat, mild Retinal pigment epithelial mottling, No heme or edema   Vessels attenuated Normal   Periphery  Retina attached, good buckle height, good laser 360 on buckle and surrounding breaks, mild fibrosis over buckle at 10:30 Attached, pigmented Lattice degeneration with atrophic holes at 0430; lattice at 1200 with atrophic break; good laser in place surrounding all lesions          IMAGING AND PROCEDURES  Imaging and Procedures for _0 @  OCT, Retina - OU - Both Eyes       Right Eye Quality was good. Central Foveal Thickness: 338. Progression has worsened. Findings include no IRF, no SRF, epiretinal membrane, abnormal foveal contour (Trace cystic changes, interval progression of ERM).   Left Eye Quality was good. Central Foveal Thickness: 285. Progression has been stable. Findings include normal foveal contour, no IRF, no SRF, vitreomacular adhesion .   Notes *Images captured and stored on drive  Diagnosis / Impression:  OD: No IRF/SRF mild ERM-trace cystic changes, interval progression of ERM OS: NFP, No IRF/SRF, VMA-stable  Clinical management:  See below  Abbreviations: NFP - Normal foveal profile. CME - cystoid macular edema. PED - pigment epithelial detachment. IRF - intraretinal fluid. SRF - subretinal fluid. EZ - ellipsoid zone. ERM - epiretinal membrane. ORA - outer  retinal atrophy. ORT - outer retinal tubulation. SRHM - subretinal hyper-reflective material                  ASSESSMENT/PLAN:    ICD-10-CM   1. 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 Epiretinal membrane (ERM) of right eye H35.371   3. Retinal edema H35.81 OCT, Retina - OU - Both Eyes  4. Lattice degeneration of both retinas H35.413   5. 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 Posterior vitreous detachment of both eyes H43.813   7. Pseudophakia of right eye Z96.1   8. Combined forms of age-related cataract of left eye H25.812   9. Combined forms of age-related cataract of both eyes H25.813     1. 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 Herbert Deaner on 9.24.19             - retina attached and in good position -- good buckle height and laser around breaks             - IOP okay today  - cont Brimonidine OD QD  2,3.  Mild ERM OD-monitor for now  -FU in 3-4 months  4,5. Lattice degeneration OU;  w/ atrophic breaks left eye - OD - superotemporal lattice at site of breaks and detachment - OS - patches of lattice at 12 and 430 with a single atrophic breaks at each site - s/p laser retinopexy OS  at time of surgery OD - good laser in place - monitor  6. PVD / vitreous syneresis OS  Discussed findings and prognosis  No RT or RD on 360 peripheral exam  Reviewed s/s of RT/RD  Strict return precautions for any such RT/RD signs/symptoms  7,8. Pseudophakia OD  - cataract OD progressively worsened following PPV  - s/p CE/IOL by expert surgeon Dr. Herbert Deaner on 09.24.19  - beautiful surgery, doing well with new glasses  - PCO/ring-like fibrosis OD  -  would likely benefit from yag laser with Dr. Herbert Deaner OD -- will send note  9. Combined form age-related cataract OS-  - The symptoms of cataract, surgical options, and treatments and risks were discussed with patient. - discussed diagnosis and progression - under the expert care of Dr. Herbert Deaner - pt wishes to hold off on CE/IOL OS for now    Ophthalmic Meds Ordered this visit:  No orders of the defined types were placed in this encounter.      Return 3-4 months, for DFE, OCT.  There are no Patient Instructions on file for this visit.   Explained the diagnoses, plan, and follow up with the patient and they expressed understanding.  Patient expressed understanding of the importance of proper follow up care.   This document serves as a record of services personally performed by Gardiner Sleeper, MD, PhD. It was created on their behalf by Ernest Mallick, OA, an ophthalmic assistant. The creation of this record is the provider's dictation and/or activities during the visit.    Electronically signed by: Ernest Mallick, OA  12.03.19 2:19 AM     Gardiner Sleeper, M.D., Ph.D. Diseases & Surgery of the Retina and Vitreous [facility]  I have reviewed the above documentation for accuracy and completeness, and I agree with the above. Gardiner Sleeper, M.D., Ph.D. 02/06/18 2:25 AM    Abbreviations: M myopia (nearsighted); A astigmatism; H hyperopia (farsighted); P presbyopia; Mrx spectacle prescription;  CTL contact lenses; OD right eye; OS left eye; OU both eyes  XT exotropia; ET esotropia; PEK punctate epithelial keratitis; PEE punctate epithelial erosions; DES dry eye syndrome; MGD meibomian gland dysfunction; ATs artificial tears; PFAT's preservative free artificial tears; [place] nuclear sclerotic cataract; PSC posterior subcapsular cataract; ERM epi-retinal membrane; PVD posterior vitreous detachment; RD retinal detachment; DM diabetes mellitus; DR diabetic retinopathy; NPDR  non-proliferative diabetic retinopathy; PDR proliferative diabetic retinopathy; CSME clinically significant macular edema; DME diabetic macular edema; dbh dot blot hemorrhages; CWS cotton wool spot; POAG primary open angle glaucoma; C/D cup-to-disc ratio; HVF humphrey visual field; GVF goldmann visual field; OCT optical coherence tomography; IOP intraocular pressure; BRVO Branch retinal vein occlusion; CRVO central retinal vein occlusion; CRAO central retinal artery occlusion; BRAO branch retinal artery occlusion; RT retinal tear; SB scleral buckle; PPV pars plana vitrectomy; VH Vitreous hemorrhage; PRP panretinal laser photocoagulation; IVK intravitreal kenalog; VMT vitreomacular traction; MH Macular hole;  NVD neovascularization of the disc; NVE neovascularization elsewhere; AREDS age related eye disease study; ARMD age related macular degeneration; POAG primary open angle glaucoma; EBMD epithelial/anterior basement membrane dystrophy; ACIOL anterior chamber intraocular lens; IOL intraocular lens; PCIOL posterior chamber intraocular lens; Phaco/IOL phacoemulsification with intraocular lens placement; [place] photorefractive keratectomy; LASIK laser assisted in situ keratomileusis; HTN hypertension; DM diabetes mellitus; COPD chronic obstructive pulmonary disease

## 2018-02-02 ENCOUNTER — Encounter (INDEPENDENT_AMBULATORY_CARE_PROVIDER_SITE_OTHER): Payer: Self-pay | Admitting: Ophthalmology

## 2018-02-02 ENCOUNTER — Ambulatory Visit (INDEPENDENT_AMBULATORY_CARE_PROVIDER_SITE_OTHER): Payer: Federal, State, Local not specified - PPO | Admitting: Ophthalmology

## 2018-02-02 DIAGNOSIS — H35413 Lattice degeneration of retina, bilateral: Secondary | ICD-10-CM

## 2018-02-02 DIAGNOSIS — H43812 Vitreous degeneration, left eye: Secondary | ICD-10-CM

## 2018-02-02 DIAGNOSIS — H33332 Multiple defects of retina without detachment, left eye: Secondary | ICD-10-CM

## 2018-02-02 DIAGNOSIS — H25812 Combined forms of age-related cataract, left eye: Secondary | ICD-10-CM

## 2018-02-02 DIAGNOSIS — K219 Gastro-esophageal reflux disease without esophagitis: Secondary | ICD-10-CM | POA: Diagnosis not present

## 2018-02-02 DIAGNOSIS — H35371 Puckering of macula, right eye: Secondary | ICD-10-CM | POA: Diagnosis not present

## 2018-02-02 DIAGNOSIS — H3581 Retinal edema: Secondary | ICD-10-CM | POA: Diagnosis not present

## 2018-02-02 DIAGNOSIS — Z961 Presence of intraocular lens: Secondary | ICD-10-CM

## 2018-02-02 DIAGNOSIS — K449 Diaphragmatic hernia without obstruction or gangrene: Secondary | ICD-10-CM | POA: Diagnosis not present

## 2018-02-02 DIAGNOSIS — K2930 Chronic superficial gastritis without bleeding: Secondary | ICD-10-CM | POA: Diagnosis not present

## 2018-02-02 DIAGNOSIS — K228 Other specified diseases of esophagus: Secondary | ICD-10-CM | POA: Diagnosis not present

## 2018-02-02 DIAGNOSIS — H3321 Serous retinal detachment, right eye: Secondary | ICD-10-CM

## 2018-02-02 DIAGNOSIS — B9681 Helicobacter pylori [H. pylori] as the cause of diseases classified elsewhere: Secondary | ICD-10-CM | POA: Diagnosis not present

## 2018-02-02 DIAGNOSIS — H26491 Other secondary cataract, right eye: Secondary | ICD-10-CM

## 2018-02-06 ENCOUNTER — Encounter (INDEPENDENT_AMBULATORY_CARE_PROVIDER_SITE_OTHER): Payer: Self-pay | Admitting: Ophthalmology

## 2018-02-07 ENCOUNTER — Ambulatory Visit: Payer: Federal, State, Local not specified - PPO | Admitting: Family Medicine

## 2018-02-07 ENCOUNTER — Encounter: Payer: Self-pay | Admitting: Family Medicine

## 2018-02-07 ENCOUNTER — Other Ambulatory Visit: Payer: Self-pay

## 2018-02-07 ENCOUNTER — Ambulatory Visit (HOSPITAL_BASED_OUTPATIENT_CLINIC_OR_DEPARTMENT_OTHER)
Admission: RE | Admit: 2018-02-07 | Discharge: 2018-02-07 | Disposition: A | Discharge status: Home / Self Care | Payer: Federal, State, Local not specified - PPO | Source: Ambulatory Visit | Attending: Family Medicine | Admitting: Family Medicine

## 2018-02-07 VITALS — BP 145/93 | HR 84 | Temp 98.2°F | Resp 16 | Ht 62.0 in | Wt 160.0 lb

## 2018-02-07 DIAGNOSIS — J0100 Acute maxillary sinusitis, unspecified: Secondary | ICD-10-CM | POA: Insufficient documentation

## 2018-02-07 DIAGNOSIS — R059 Cough, unspecified: Secondary | ICD-10-CM

## 2018-02-07 DIAGNOSIS — R05 Cough: Secondary | ICD-10-CM

## 2018-02-07 MED ORDER — AMOXICILLIN-POT CLAVULANATE 875-125 MG PO TABS: 1.0000 | ORAL_TABLET | Freq: Two times a day (BID) | ORAL | 0 refills | Status: DC

## 2018-02-07 MED ORDER — BENZONATATE 100 MG PO CAPS: 100.0000 mg | ORAL_CAPSULE | Freq: Two times a day (BID) | ORAL | 0 refills | Status: DC | PRN

## 2018-02-07 NOTE — Patient Instructions (Signed)
Rest, hydrate.  + flonase, mucinex (DM if cough) Augmentin every 12 hours prescribed, take until completed.  If cough present it can last up to 6-8 weeks.  F/U 2 weeks of not improved.  Please have chest xray completed today.    Sinusitis, Adult Sinusitis is soreness and inflammation of your sinuses. Sinuses are hollow spaces in the bones around your face. They are located:  Around your eyes.  In the middle of your forehead.  Behind your nose.  In your cheekbones.  Your sinuses and nasal passages are lined with a stringy fluid (mucus). Mucus normally drains out of your sinuses. When your nasal tissues get inflamed or swollen, the mucus can get trapped or blocked so air cannot flow through your sinuses. This lets bacteria, viruses, and funguses grow, and that leads to infection. Follow these instructions at home: Medicines  Take, use, or apply over-the-counter and prescription medicines only as told by your doctor. These may include nasal sprays.  If you were prescribed an antibiotic medicine, take it as told by your doctor. Do not stop taking the antibiotic even if you start to feel better. Hydrate and Humidify  Drink enough water to keep your pee (urine) clear or pale yellow.  Use a cool mist humidifier to keep the humidity level in your home above 50%.  Breathe in steam for 10-15 minutes, 3-4 times a day or as told by your doctor. You can do this in the bathroom while a hot shower is running.  Try not to spend time in cool or dry air. Rest  Rest as much as possible.  Sleep with your head raised (elevated).  Make sure to get enough sleep each night. General instructions  Put a warm, moist washcloth on your face 3-4 times a day or as told by your doctor. This will help with discomfort.  Wash your hands often with soap and water. If there is no soap and water, use hand sanitizer.  Do not smoke. Avoid being around people who are smoking (secondhand smoke).  Keep all  follow-up visits as told by your doctor. This is important. Contact a doctor if:  You have a fever.  Your symptoms get worse.  Your symptoms do not get better within 10 days. Get help right away if:  You have a very bad headache.  You cannot stop throwing up (vomiting).  You have pain or swelling around your face or eyes.  You have trouble seeing.  You feel confused.  Your neck is stiff.  You have trouble breathing. This information is not intended to replace advice given to you by your health care provider. Make sure you discuss any questions you have with your health care provider. Document Released: 08/05/2007 Document Revised: 10/13/2015 Document Reviewed: 12/12/2014 Elsevier Interactive Patient Education  [employer]2018 Elsevier Inc[employer]

## 2018-02-07 NOTE — Progress Notes (Signed)
Gail Duncan , October 11, 1965, 52 y.o., female MRN: [id number] Patient Care Team    Relationship Specialty Notifications Start End  Gail Leatherwood, DO PCP - General Family Medicine  09/07/16   Gail Cairo, MD Consulting Physician Obstetrics and Gynecology  05/13/17     Chief Complaint  Patient presents with  . Sore Throat    Had endoscopy on wednesday - concerned about sore throat  . Sinus Problem    X's 24hours     Subjective: Pt presents for an OV with complaints of sore throat  of 3 days duration.  Associated symptoms include fatigue, rhinorrhea, sinus pressure, chills, and mild cough. Her appetite is decreased, only drinking water and eating chicken broth. She had an EGD on Wednesday, but felt dine Thursday and Friday. She has not had a flu shot.  Pt has tried Dayquil and honey to ease their symptoms.   Depression screen [facility] 2/9 05/13/2017 05/05/2017  Decreased Interest 0 0  Down, Depressed, Hopeless 0 0  PHQ - 2 Score 0 0    Allergies  Allergen Reactions  . Asa [Aspirin] Other (See Comments)    Contraindicated due to medical condition   Social History   Tobacco Use  . Smoking status: Never Smoker  . Smokeless tobacco: Never Used  Substance Use Topics  . Alcohol use: Yes    Alcohol/week: 0.0 standard drinks    Comment: occasional social use   Past Medical History:  Diagnosis Date  . Anemia   . GERD (gastroesophageal reflux disease)   . Hearing loss in right ear    Unknown cause, had full work up with Specialist in [place]  . History of chicken pox    Past Surgical History:  Procedure Laterality Date  . ABDOMINAL EXPLORATION SURGERY    . ABLATION    . DENTAL SURGERY    . EXPLORATORY LAPAROTOMY     "fibrous"  . GAS INSERTION Right 08/09/2017   Procedure: INSERTION OF GAS;  Surgeon: Rennis Chris, MD;  Location: [facility] OR;  Service: Ophthalmology;  Laterality: Right;  . LASER PHOTO ABLATION Left 08/09/2017   Procedure: LASER PHOTO ABLATION;  Surgeon: Rennis Chris,  MD;  Location: [facility] OR;  Service: Ophthalmology;  Laterality: Left;  . SCLERAL BUCKLE WITH POSSIBLE 25 GAUGE PARS PLANA VITRECTOMY Right 08/09/2017   Procedure: SCLERAL BUCKLE WITH 25 GAUGE PARS PLANA VITRECTOMY AND ENDOLASER;  Surgeon: Rennis Chris, MD;  Location: [facility] OR;  Service: Ophthalmology;  Laterality: Right;   Family History  Problem Relation Age of Onset  . Diabetes Mother   . Prostate cancer Father   . Hypertension Father   . Retinal detachment Father   . Lung cancer Paternal Grandfather   . Amblyopia Neg Hx   . Blindness Neg Hx   . Cataracts Neg Hx   . Glaucoma Neg Hx   . Macular degeneration Neg Hx   . Strabismus Neg Hx   . Retinitis pigmentosa Neg Hx    Allergies as of 02/07/2018      Reactions   Asa [aspirin] Other (See Comments)   Contraindicated due to medical condition      Medication List        Accurate as of 02/07/18  1:33 PM. Always use your most recent med list.          ANUCORT-HC 25 MG suppository Generic drug:  hydrocortisone Place 25 mg rectally 2 (two) times daily as needed for hemorrhoids or anal itching.   atropine 1 % ophthalmic solution  Place 1 drop into the right eye 2 (two) times daily at 10 am and 4 pm.   brimonidine 0.2 % ophthalmic solution Commonly known as:  ALPHAGAN Place 1 drop into the right eye 2 (two) times daily.   chlorpheniramine 4 MG tablet Commonly known as:  CHLOR-TRIMETON Take 4 mg by mouth 2 (two) times daily as needed for allergies.   CoQ10-Vitamin E 100-10 MG-UNIT Caps Take 1 capsule by mouth daily.   dorzolamide-timolol 22.3-6.8 MG/ML ophthalmic solution Commonly known as:  COSOPT Place 1 drop into the right eye 2 (two) times daily.   ketorolac 0.5 % ophthalmic solution Commonly known as:  ACULAR   pantoprazole 40 MG tablet Commonly known as:  PROTONIX Take 40 mg by mouth daily.   Vitamin D3 125 MCG (5000 UT) Caps Take 5,000 capsules by mouth 3 (three) times a week.       All past medical history,  surgical history, allergies, family history, immunizations andmedications were updated in the EMR today and reviewed under the history and medication portions of their EMR.     ROS: Negative, with the exception of above mentioned in HPI   Objective:  BP (!) 145/93   Pulse 84   Temp 98.2 F (36.8 C) (Oral)   Resp 16   Ht 5\' 2"  (1.575 m)   Wt 160 lb (72.6 kg)   LMP 12/22/2014   SpO2 98%   BMI 29.26 kg/m  Body mass index is 29.26 kg/m. Gen: Afebrile. No acute distress. Nontoxic in appearance, well developed, well nourished.  HENT: AT. [place]. Bilateral TM visualized WNL. MMM, no oral lesions. Bilateral nares with erythema, drainage and swelling. Throat without erythema or exudates. Cough and sinus pressure present.  Eyes:Pupils Equal Round Reactive to light, Extraocular movements intact,  Conjunctiva without redness, discharge or icterus. Neck/lymp/endocrine: Supple,right ant cervical  Lymphadenopathy and tenderness CV: RRR no murmur, no edema Chest: RLL crackles, otherwise CTAB, no wheeze or crackles. Good air movement, normal resp effort.  Abd: Soft. NTND. BS present . no Masses palpated. Skin: no rashes, purpura or petechiae.  Neuro: Normal gait. PERLA. EOMi. Alert. Oriented x3  Psych: Normal affect, dress and demeanor. Normal speech. Normal thought content and judgment.  No exam data present No results found. No results found for this or any previous visit (from the past 24 hour(s)).  Assessment/Plan: Gail MatarMarianna Duncan is a 52 y.o. female present for OV for  Acute non-recurrent maxillary sinusitis/cough/crackles Rest, hydrate.  + flonase, mucinex (DM if cough) Augmentin BID prescribed, take until completed.  Tessalon perles prescribed.  If cough present it can last up to 6-8 weeks.  F/U 2 weeks of not improved.   - DG Chest 2 View; Future to rule out PNA   Reviewed expectations re: course of current medical issues.  Discussed self-management of symptoms.  Outlined signs and  symptoms indicating need for more acute intervention.  Patient verbalized understanding and all questions were answered.  Patient received an After-Visit Summary.    No orders of the defined types were placed in this encounter.    Note is dictated utilizing voice recognition software. Although note has been proof read prior to signing, occasional typographical errors still can be missed. If any questions arise, please do not hesitate to call for verification.   electronically signed by:  Felix Pacinienee Kuneff, DO  [facility] [place]

## 2018-02-09 ENCOUNTER — Ambulatory Visit (INDEPENDENT_AMBULATORY_CARE_PROVIDER_SITE_OTHER): Payer: Federal, State, Local not specified - PPO | Admitting: Ophthalmology

## 2018-02-09 ENCOUNTER — Encounter (INDEPENDENT_AMBULATORY_CARE_PROVIDER_SITE_OTHER): Payer: Self-pay | Admitting: Ophthalmology

## 2018-02-09 DIAGNOSIS — H3581 Retinal edema: Secondary | ICD-10-CM

## 2018-02-09 DIAGNOSIS — K228 Other specified diseases of esophagus: Secondary | ICD-10-CM | POA: Diagnosis not present

## 2018-02-09 DIAGNOSIS — H35371 Puckering of macula, right eye: Secondary | ICD-10-CM | POA: Diagnosis not present

## 2018-02-09 DIAGNOSIS — H25813 Combined forms of age-related cataract, bilateral: Secondary | ICD-10-CM

## 2018-02-09 DIAGNOSIS — K2930 Chronic superficial gastritis without bleeding: Secondary | ICD-10-CM | POA: Diagnosis not present

## 2018-02-09 DIAGNOSIS — H43812 Vitreous degeneration, left eye: Secondary | ICD-10-CM

## 2018-02-09 DIAGNOSIS — K219 Gastro-esophageal reflux disease without esophagitis: Secondary | ICD-10-CM | POA: Diagnosis not present

## 2018-02-09 DIAGNOSIS — H35413 Lattice degeneration of retina, bilateral: Secondary | ICD-10-CM

## 2018-02-09 DIAGNOSIS — H26491 Other secondary cataract, right eye: Secondary | ICD-10-CM

## 2018-02-09 DIAGNOSIS — H3321 Serous retinal detachment, right eye: Secondary | ICD-10-CM

## 2018-02-09 DIAGNOSIS — H25812 Combined forms of age-related cataract, left eye: Secondary | ICD-10-CM

## 2018-02-09 DIAGNOSIS — H43813 Vitreous degeneration, bilateral: Secondary | ICD-10-CM

## 2018-02-09 DIAGNOSIS — Z961 Presence of intraocular lens: Secondary | ICD-10-CM

## 2018-02-09 DIAGNOSIS — H33332 Multiple defects of retina without detachment, left eye: Secondary | ICD-10-CM

## 2018-02-09 NOTE — Progress Notes (Signed)
[facility] Note  02/09/2018     CHIEF COMPLAINT Patient presents for Blurred Vision   HISTORY OF PRESENT ILLNESS: Gail Duncan is a 52 y.o. female who presents to the clinic today for:   HPI    Blurred Vision    In right eye.  Onset was sudden.  Vision is blurred.  Severity is mild.  This started 3 days ago.  Occurring constantly.  It is worse throughout the day.  Context:  distance vision, mid-range vision and near vision.  Since onset it is stable.  Associated symptoms include dryness and eye pain.  Treatments tried include artificial tears.  Response to treatment was no improvement.  I, the attending physician,  performed the HPI with the patient and updated documentation appropriately.          Comments    52 y/o female pt here c/o decreased VA OD since last visit on 12.04.19.  Pt began feeling like she had a cold about 3 days ago, and began feeling a lot of pressure around her right eye.  It began to seem like her peripheral vision OD was getting a bit worse.  Vision OD slightly blurred, and has FBS OD.  Also reports some minor pain w/eye movement OD.  No problems reported OS.  Denies flashes, floaters.  PFAT prn OU.       Last edited by Bernarda Caffey, MD on 02/12/2018 11:13 PM. (History)      Referring physician: Ma Hillock, DO [address] [place], [place] [address]  HISTORICAL INFORMATION:   Selected notes from the MEDICAL RECORD NUMBER Referred by ED for possible RD   CURRENT MEDICATIONS: Current Outpatient Medications (Ophthalmic Drugs)  Medication Sig  . atropine 1 % ophthalmic solution Place 1 drop into the right eye 2 (two) times daily at 10 am and 4 pm.  . brimonidine (ALPHAGAN) 0.2 % ophthalmic solution Place 1 drop into the right eye 2 (two) times daily.  . dorzolamide-timolol (COSOPT) 22.3-6.8 MG/ML ophthalmic solution Place 1 drop into the right eye 2 (two) times daily.  Marland Kitchen ketorolac (ACULAR) 0.5 % ophthalmic solution    No  current facility-administered medications for this visit.  (Ophthalmic Drugs)   Current Outpatient Medications (Other)  Medication Sig  . amoxicillin-clavulanate (AUGMENTIN) 875-125 MG tablet Take 1 tablet by mouth 2 (two) times daily.  . ANUCORT-HC 25 MG suppository Place 25 mg rectally 2 (two) times daily as needed for hemorrhoids or anal itching.   . benzonatate (TESSALON) 100 MG capsule Take 1 capsule (100 mg total) by mouth 2 (two) times daily as needed for cough.  . chlorpheniramine (CHLOR-TRIMETON) 4 MG tablet Take 4 mg by mouth 2 (two) times daily as needed for allergies.  . Cholecalciferol (VITAMIN D3) 5000 units CAPS Take 5,000 capsules by mouth 3 (three) times a week.   . Coenzyme Q10-Vitamin E (COQ10-VITAMIN E) 100-10 MG-UNIT CAPS Take 1 capsule by mouth daily.   . pantoprazole (PROTONIX) 40 MG tablet Take 40 mg by mouth daily.   No current facility-administered medications for this visit.  (Other)      REVIEW OF SYSTEMS: ROS    Positive for: Eyes   Negative for: Constitutional, Gastrointestinal, Neurological, Skin, Genitourinary, Musculoskeletal, HENT, Endocrine, Cardiovascular, Respiratory, Psychiatric, Allergic/Imm, Heme/Lymph   Last edited by Matthew Folks, COA on 02/09/2018  8:52 AM. (History)       ALLERGIES Allergies  Allergen Reactions  . Asa [Aspirin] Other (See Comments)  Contraindicated due to medical condition    PAST MEDICAL HISTORY Past Medical History:  Diagnosis Date  . Anemia   . GERD (gastroesophageal reflux disease)   . Hearing loss in right ear    Unknown cause, had full work up with Specialist in [place]  . History of chicken pox    Past Surgical History:  Procedure Laterality Date  . ABDOMINAL EXPLORATION SURGERY    . ABLATION    . DENTAL SURGERY    . EXPLORATORY LAPAROTOMY     "fibrous"  . GAS INSERTION Right 08/09/2017   Procedure: INSERTION OF GAS;  Surgeon: Bernarda Caffey, MD;  Location: [facility];  Service: Ophthalmology;  Laterality:  Right;  . LASER PHOTO ABLATION Left 08/09/2017   Procedure: LASER PHOTO ABLATION;  Surgeon: Bernarda Caffey, MD;  Location: [facility];  Service: Ophthalmology;  Laterality: Left;  . SCLERAL BUCKLE WITH POSSIBLE 25 GAUGE PARS PLANA VITRECTOMY Right 08/09/2017   Procedure: SCLERAL BUCKLE WITH 25 GAUGE PARS PLANA VITRECTOMY AND ENDOLASER;  Surgeon: Bernarda Caffey, MD;  Location: [facility];  Service: Ophthalmology;  Laterality: Right;    FAMILY HISTORY Family History  Problem Relation Age of Onset  . Diabetes Mother   . Prostate cancer Father   . Hypertension Father   . Retinal detachment Father   . Lung cancer Paternal Grandfather   . Amblyopia Neg Hx   . Blindness Neg Hx   . Cataracts Neg Hx   . Glaucoma Neg Hx   . Macular degeneration Neg Hx   . Strabismus Neg Hx   . Retinitis pigmentosa Neg Hx     SOCIAL HISTORY Social History   Tobacco Use  . Smoking status: Never Smoker  . Smokeless tobacco: Never Used  Substance Use Topics  . Alcohol use: Yes    Alcohol/week: 0.0 standard drinks    Comment: occasional social use  . Drug use: No         OPHTHALMIC EXAM:  Base Eye Exam    Visual Acuity (Snellen - Linear)      Right Left   Dist cc 20/30 + 20/25 -2   Dist ph cc 20/25 -2 NI   Correction:  Glasses       Tonometry (Tonopen, 8:53 AM)      Right Left   Pressure 15 13       Pupils      Dark Light Shape React APD   Right 4 3 Round Brisk None   Left 3 2 Round Brisk None       Visual Fields (Counting fingers)      Left Right    Full Full       Extraocular Movement      Right Left    Full, Ortho Full, Ortho       Neuro/Psych    Oriented x3:  Yes   Mood/Affect:  Normal       Dilation    Right eye:  1.0% Mydriacyl, 2.5% Phenylephrine @ 8:53 AM  Pt defers dilation OS so she can go to work after appt.        Slit Lamp and Fundus Exam    External Exam      Right Left   External Normal        Slit Lamp Exam      Right Left   Lids/Lashes Meibomian gland  dysfunction, Scurf Normal   Conjunctiva/Sclera White and quiet White and quiet   Cornea  1+2+ inferior Punctate epithelial erosions, Well healed  cataract wound at 1030 1+ diffuse Punctate epithelial erosions   Anterior Chamber Deep Deep and quiet   Iris Round and well dilated Round and well dilated   Lens PC IOL in good position, 3+ fibrotic PCO--ring-like with pigment deposition on capsule  2-3+ Nuclear sclerosis with mild brunescence, 2+ Cortical cataract   Vitreous Post vitrectomy Vitreous syneresis, Posterior vitreous detachment, condensations centrally over macula       Fundus Exam      Right Left   Disc Hazy view, Pink and Sharp, mildly Tilted disc Pink and Sharp, mildly Tilted disc   C/D Ratio 0.2 0.4   Macula Attached, Flat, blunted foveal reflex, Epiretinal membrane Flat, mild Retinal pigment epithelial mottling, No heme or edema   Vessels attenuated Normal   Periphery  Retina attached, good buckle height, good laser 360 on buckle and surrounding breaks, mild fibrosis over buckle at 10:30 Attached, pigmented Lattice degeneration with atrophic holes at 0430; lattice at 1200 with atrophic break; good laser in place surrounding all lesions          IMAGING AND PROCEDURES  Imaging and Procedures for _0 @  OCT, Retina - OU - Both Eyes       Right Eye Quality was good. Central Foveal Thickness: 332. Progression has been stable. Findings include no IRF, no SRF, epiretinal membrane, abnormal foveal contour (Trace cystic changes, interval progression of ERM).   Left Eye Quality was good. Central Foveal Thickness: 281. Progression has been stable. Findings include normal foveal contour, no IRF, no SRF, vitreomacular adhesion .   Notes *Images captured and stored on drive  Diagnosis / Impression:  OD: No IRF/SRF mild ERM w/ trace cystic changes,  OS: NFP, No IRF/SRF, VMA-stable  Clinical management:  See below  Abbreviations: NFP - Normal foveal profile. CME - cystoid  macular edema. PED - pigment epithelial detachment. IRF - intraretinal fluid. SRF - subretinal fluid. EZ - ellipsoid zone. ERM - epiretinal membrane. ORA - outer retinal atrophy. ORT - outer retinal tubulation. SRHM - subretinal hyper-reflective material                  ASSESSMENT/PLAN:    ICD-10-CM   1. 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 Epiretinal membrane (ERM) of right eye H35.371   3. Retinal edema H35.81 OCT, Retina - OU - Both Eyes  4. Lattice degeneration of both retinas H35.413   5. 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 Posterior vitreous detachment of left eye H43.812   7. Pseudophakia of right eye Z96.1   8. PCO (posterior capsular opacification), right H26.491   9. Combined forms of age-related cataract of left eye H25.812   10. Posterior vitreous detachment of both eyes H43.813   11. Combined forms of age-related cataract of both eyes H25.813    Presents today acutely for subjective vision changes and eye discomfort/irritation and watering - currently being treated for upper respiratory infection - BCVA remains 20/25 OU - no acute, significant change in exam - monitor  1. 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 Herbert Deaner on 9.24.19             -  retina attached and in good position -- good buckle height and laser around breaks             - IOP okay today  - cont Brimonidine OD QD  2,3.  Mild ERM OD-monitor for now  -FU in 3-4 months  4,5. Lattice degeneration OU;  w/ atrophic breaks left eye - OD - superotemporal lattice at site of breaks and detachment - OS - patches of lattice at 12 and 430 with a single atrophic  breaks at each site - s/p laser retinopexy OS at time of surgery OD - good laser in place - monitor  6. PVD / vitreous syneresis OS  Discussed findings and prognosis  No RT or RD on 360 peripheral exam  Reviewed s/s of RT/RD  Strict return precautions for any such RT/RD signs/symptoms  7,8. Pseudophakia OD  - cataract OD progressively worsened following PPV  - s/p CE/IOL by expert surgeon Dr. Herbert Deaner on 09.24.19  - beautiful surgery, doing well with new glasses  - PCO/ring-like fibrosis OD  - would likely benefit from yag laser with Dr. Herbert Deaner OD  9. Combined form age-related cataract OS-  - The symptoms of cataract, surgical options, and treatments and risks were discussed with patient. - discussed diagnosis and progression - under the expert care of Dr. Herbert Deaner - pt wishes to hold off on CE/IOL OS for now    Ophthalmic Meds Ordered this visit:  No orders of the defined types were placed in this encounter.      Return for as scheduled.  There are no Patient Instructions on file for this visit.   Explained the diagnoses, plan, and follow up with the patient and they expressed understanding.  Patient expressed understanding of the importance of proper follow up care.   This document serves as a record of services personally performed by Gardiner Sleeper, MD, PhD. It was created on their behalf by Ernest Mallick, OA, an ophthalmic assistant. The creation of this record is the provider's dictation and/or activities during the visit.    Electronically signed by: Ernest Mallick, OA  12.11.19 11:20 PM    Gardiner Sleeper, M.D., Ph.D. Diseases & Surgery of the Retina and Vitreous [facility]  I have reviewed the above documentation for accuracy and completeness, and I agree with the above. Gardiner Sleeper, M.D., Ph.D. 02/12/18 11:20 PM   Abbreviations: M myopia (nearsighted); A astigmatism; H hyperopia (farsighted); P presbyopia; Mrx spectacle prescription;   CTL contact lenses; OD right eye; OS left eye; OU both eyes  XT exotropia; ET esotropia; PEK punctate epithelial keratitis; PEE punctate epithelial erosions; DES dry eye syndrome; MGD meibomian gland dysfunction; ATs artificial tears; PFAT's preservative free artificial tears; [place]rys nuclear sclerotic cataract; PSC posterior subcapsular cataract; ERM epi-retinal membrane; PVD posterior vitreous detachment; RD retinal detachment; DM diabetes mellitus; DR diabetic retinopathy; NPDR non-proliferative diabetic retinopathy; PDR proliferative diabetic retinopathy; CSME clinically significant macular edema; DME diabetic macular edema; dbh dot blot hemorrhages; CWS cotton wool spot; POAG primary open angle glaucoma; C/D cup-to-disc ratio; HVF humphrey visual field; GVF goldmann visual field; OCT optical coherence tomography; IOP intraocular pressure; BRVO Branch retinal vein occlusion; CRVO central retinal vein occlusion; CRAO central retinal artery occlusion; BRAO branch retinal artery occlusion; RT retinal tear; SB scleral buckle; PPV pars plana vitrectomy; VH Vitreous hemorrhage; PRP panretinal laser photocoagulation; IVK intravitreal kenalog; VMT vitreomacular traction; MH Macular hole;  NVD neovascularization of the disc; NVE neovascularization elsewhere; AREDS age related  eye disease study; ARMD age related macular degeneration; POAG primary open angle glaucoma; EBMD epithelial/anterior basement membrane dystrophy; ACIOL anterior chamber intraocular lens; IOL intraocular lens; PCIOL posterior chamber intraocular lens; Phaco/IOL phacoemulsification with intraocular lens placement; [place] photorefractive keratectomy; LASIK laser assisted in situ keratomileusis; HTN hypertension; DM diabetes mellitus; COPD chronic obstructive pulmonary disease

## 2018-02-12 ENCOUNTER — Encounter (INDEPENDENT_AMBULATORY_CARE_PROVIDER_SITE_OTHER): Payer: Self-pay | Admitting: Ophthalmology

## 2018-03-07 DIAGNOSIS — H33001 Unspecified retinal detachment with retinal break, right eye: Secondary | ICD-10-CM | POA: Diagnosis not present

## 2018-03-07 DIAGNOSIS — H26491 Other secondary cataract, right eye: Secondary | ICD-10-CM | POA: Diagnosis not present

## 2018-03-07 DIAGNOSIS — H35341 Macular cyst, hole, or pseudohole, right eye: Secondary | ICD-10-CM | POA: Diagnosis not present

## 2018-03-07 DIAGNOSIS — H35413 Lattice degeneration of retina, bilateral: Secondary | ICD-10-CM | POA: Diagnosis not present

## 2018-04-19 DIAGNOSIS — K080 Exfoliation of teeth due to systemic causes: Secondary | ICD-10-CM | POA: Diagnosis not present

## 2018-05-16 ENCOUNTER — Encounter: Payer: Federal, State, Local not specified - PPO | Admitting: Family Medicine

## 2018-05-24 ENCOUNTER — Encounter (INDEPENDENT_AMBULATORY_CARE_PROVIDER_SITE_OTHER): Payer: Federal, State, Local not specified - PPO | Admitting: Ophthalmology

## 2018-05-25 ENCOUNTER — Encounter: Payer: Federal, State, Local not specified - PPO | Admitting: Family Medicine

## 2018-06-29 NOTE — Progress Notes (Signed)
[facility] Note  06/30/2018     CHIEF COMPLAINT Patient presents for Retina Evaluation   HISTORY OF PRESENT ILLNESS: Gail Duncan is a 53 y.o. female who presents to the clinic today for:   HPI    Retina Evaluation    In right eye.  This started months ago.  Duration of months.  I, the attending physician,  performed the HPI with the patient and updated documentation appropriately.          Comments    Patient states her vision is not getting any better.  Patient complains of the feeling of water in her right eye.  She states occasionally she feels like she is looking through a glass of water.  Patient denies eye pain or discomfort.  Patient denies any new or worsening floaters or fol OU.   Patient states Dr. Herbert Deaner was going to do a yag PC OD in December but was unable to, due to "water in the eye" per patient.       Last edited by Bernarda Caffey, MD on 06/30/2018  5:00 PM. (History)    pt states Dr. Herbert Deaner was going to do a YAG procedure, but decided not to bc of "water" in the back of her eye, pt states her right eye sees better than her left eye due to the cataract in her left eye  Referring physician: Ma Hillock, DO [address] [place], [place] [address]  HISTORICAL INFORMATION:   Selected notes from the MEDICAL RECORD NUMBER Referred by ED for possible RD   CURRENT MEDICATIONS: Current Outpatient Medications (Ophthalmic Drugs)  Medication Sig  . atropine 1 % ophthalmic solution Place 1 drop into the right eye 2 (two) times daily at 10 am and 4 pm.  . brimonidine (ALPHAGAN) 0.2 % ophthalmic solution Place 1 drop into the right eye 2 (two) times daily.  . dorzolamide-timolol (COSOPT) 22.3-6.8 MG/ML ophthalmic solution Place 1 drop into the right eye 2 (two) times daily.  Marland Kitchen ketorolac (ACULAR) 0.5 % ophthalmic solution    No current facility-administered medications for this visit.  (Ophthalmic Drugs)   Current Outpatient Medications  (Other)  Medication Sig  . amoxicillin-clavulanate (AUGMENTIN) 875-125 MG tablet Take 1 tablet by mouth 2 (two) times daily.  . ANUCORT-HC 25 MG suppository Place 25 mg rectally 2 (two) times daily as needed for hemorrhoids or anal itching.   . benzonatate (TESSALON) 100 MG capsule Take 1 capsule (100 mg total) by mouth 2 (two) times daily as needed for cough.  . chlorpheniramine (CHLOR-TRIMETON) 4 MG tablet Take 4 mg by mouth 2 (two) times daily as needed for allergies.  . Cholecalciferol (VITAMIN D3) 5000 units CAPS Take 5,000 capsules by mouth 3 (three) times a week.   . Coenzyme Q10-Vitamin E (COQ10-VITAMIN E) 100-10 MG-UNIT CAPS Take 1 capsule by mouth daily.   . pantoprazole (PROTONIX) 40 MG tablet Take 40 mg by mouth daily.   No current facility-administered medications for this visit.  (Other)      REVIEW OF SYSTEMS: ROS    Positive for: Eyes   Negative for: Constitutional, Gastrointestinal, Neurological, Skin, Genitourinary, Musculoskeletal, HENT, Endocrine, Cardiovascular, Respiratory, Psychiatric, Allergic/Imm, Heme/Lymph   Last edited by Doneen Poisson on 06/30/2018  9:01 AM. (History)       ALLERGIES Allergies  Allergen Reactions  . Asa [Aspirin] Other (See Comments)    Contraindicated due to medical condition    PAST MEDICAL HISTORY Past Medical History:  Diagnosis Date  . Anemia   . GERD (gastroesophageal reflux disease)   . Hearing loss in right ear    Unknown cause, had full work up with Specialist in [place]  . History of chicken pox    Past Surgical History:  Procedure Laterality Date  . ABDOMINAL EXPLORATION SURGERY    . ABLATION    . DENTAL SURGERY    . EXPLORATORY LAPAROTOMY     "fibrous"  . GAS INSERTION Right 08/09/2017   Procedure: INSERTION OF GAS;  Surgeon: Bernarda Caffey, MD;  Location: [place];  Service: Ophthalmology;  Laterality: Right;  . LASER PHOTO ABLATION Left 08/09/2017   Procedure: LASER PHOTO ABLATION;  Surgeon: Bernarda Caffey, MD;   Location: [place];  Service: Ophthalmology;  Laterality: Left;  . SCLERAL BUCKLE WITH POSSIBLE 25 GAUGE PARS PLANA VITRECTOMY Right 08/09/2017   Procedure: SCLERAL BUCKLE WITH 25 GAUGE PARS PLANA VITRECTOMY AND ENDOLASER;  Surgeon: Bernarda Caffey, MD;  Location: [place];  Service: Ophthalmology;  Laterality: Right;    FAMILY HISTORY Family History  Problem Relation Age of Onset  . Diabetes Mother   . Prostate cancer Father   . Hypertension Father   . Retinal detachment Father   . Lung cancer Paternal Grandfather   . Amblyopia Neg Hx   . Blindness Neg Hx   . Cataracts Neg Hx   . Glaucoma Neg Hx   . Macular degeneration Neg Hx   . Strabismus Neg Hx   . Retinitis pigmentosa Neg Hx     SOCIAL HISTORY Social History   Tobacco Use  . Smoking status: Never Smoker  . Smokeless tobacco: Never Used  Substance Use Topics  . Alcohol use: Yes    Alcohol/week: 0.0 standard drinks    Comment: occasional social use  . Drug use: No         OPHTHALMIC EXAM:  Base Eye Exam    Visual Acuity (Snellen - Linear)      Right Left   Dist cc 20/20 -1 20/40 +2   Dist ph cc  20/25 -2   Correction:  Glasses       Tonometry (Tonopen, 9:15 AM)      Right Left   Pressure 17 17       Pupils      Dark Light Shape React APD   Right 4 3 Round Brisk 0   Left 4 3 Round Brisk 0       Extraocular Movement      Right Left    Full Full       Neuro/Psych    Oriented x3:  Yes   Mood/Affect:  Normal       Dilation    Both eyes:  1.0% Mydriacyl, 2.5% Phenylephrine @ 9:15 AM        Slit Lamp and Fundus Exam    External Exam      Right Left   External Normal        Slit Lamp Exam      Right Left   Lids/Lashes Meibomian gland dysfunction, Scurf Dermatochalasis - upper lid   Conjunctiva/Sclera White and quiet White and quiet   Cornea  1+2+ inferior Punctate epithelial erosions, Well healed cataract wound at 1030 trace diffuse Punctate epithelial erosions   Anterior Chamber Deep Deep and  quiet   Iris Round and well dilated Round and well dilated   Lens PC IOL in good position, 3+ fibrotic PCO--ring-like with pigment deposition on capsule  2-3+ Nuclear sclerosis with  mild brunescence, 2+ Cortical cataract   Vitreous Post vitrectomy Vitreous syneresis, Posterior vitreous detachment, condensations centrally over macula       Fundus Exam      Right Left   Disc Pink and Sharp, mildly Tilted disc Pink and Sharp, mildly Tilted disc, mild temporal Peripapillary atrophy   C/D Ratio 0.4 0.4   Macula Attached, Flat, blunted foveal reflex, Epiretinal membrane Flat, good foveal reflex,  mild Retinal pigment epithelial mottling, No heme or edema   Vessels attenuated Vascular attenuation   Periphery  Retina attached, good buckle height, good laser 360 on buckle and surrounding breaks, mild fibrosis over buckle at 10:30 Attached, pigmented Lattice degeneration with atrophic holes at 0430; lattice at 1200 with atrophic break; good laser in place surrounding all lesions, No new RT/RD        Refraction    Wearing Rx      Sphere Cylinder Axis   Right -1.00 +0.50 180   Left -3.50 Sphere    Age:  6 mo   Type:  SVL       Manifest Refraction      Sphere Cylinder Dist VA   Right -1.50 Sphere 20/20   Left -4.00 Sphere 20/25-1          IMAGING AND PROCEDURES  Imaging and Procedures for _0 @  OCT, Retina - OU - Both Eyes       Right Eye Quality was good. Central Foveal Thickness: 374. Progression has worsened. Findings include no IRF, no SRF, epiretinal membrane, abnormal foveal contour (Mild interval thickening of central retina).   Left Eye Quality was good. Central Foveal Thickness: 291. Progression has been stable. Findings include normal foveal contour, no IRF, no SRF, vitreomacular adhesion .   Notes *Images captured and stored on drive  Diagnosis / Impression:  OD: No IRF/SRF; +ERM w/ mild interval thickening of central retina OS: NFP, No IRF/SRF,  VMA-stable  Clinical management:  See below  Abbreviations: NFP - Normal foveal profile. CME - cystoid macular edema. PED - pigment epithelial detachment. IRF - intraretinal fluid. SRF - subretinal fluid. EZ - ellipsoid zone. ERM - epiretinal membrane. ORA - outer retinal atrophy. ORT - outer retinal tubulation. SRHM - subretinal hyper-reflective material                  ASSESSMENT/PLAN:    ICD-10-CM   1. 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 Epiretinal membrane (ERM) of right eye H35.371   3. Retinal edema H35.81 OCT, Retina - OU - Both Eyes  4. Lattice degeneration of both retinas H35.413   5. 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 Posterior vitreous detachment of left eye H43.812   7. Pseudophakia of right eye Z96.1   8. PCO (posterior capsular opacification), right H26.491   9. 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 Rhegmatogenous retinal detachment, RIGHT eye - bullous superotemporal mac on detachment, onset of foveal involvement Saturday, 01/30/17 by history - detached from 1030-1200 oclock, large horseshoe tear from 1030-1100 and linear tear at 1130 within bed of lattice - s/p SBP + PPV/EL/FAX/14% C3F8 OD, 06.10.19             - doing well with retina reattached  - now s/p CE/IOL OD w/ Dr. Herbert Deaner on 9.24.19             - retina attached and in good position -- good buckle height and laser around  breaks             - IOP okay today  - cont Brimonidine OD QD  2,3.  Mild ERM OD - The natural history, anatomy, potential for loss of vision, and treatment options including vitrectomy techniques and the complications of endophthalmitis, retinal detachment, vitreous hemorrhage, cataract progression and permanent vision loss discussed with the patient. - mild ERM with mild interval retinal thickening from prior - asymptomatic, no metamorphopsia - no indication for surgery at this time - monitor for now - FU in 4-6  months  4,5. Lattice degeneration OU;  w/ atrophic breaks left eye  - OD - superotemporal lattice at site of breaks and detachment  - OS - patches of lattice at 12 and 430 with a single atrophic breaks at each site  - s/p laser retinopexy OS at time of surgery OD - good laser in place  - monitor  6. PVD / vitreous syneresis OS  Discussed findings and prognosis  No RT or RD on 360 peripheral exam  Reviewed s/s of RT/RD  Strict return precautions for any such RT/RD signs/symptoms  7,8. Pseudophakia OD  - cataract OD progressively worsened following PPV  - s/p CE/IOL by expert surgeon Dr. Herbert Deaner on 09.24.19  - beautiful surgery, doing well with new glasses  - PCO/ring-like fibrosis OD  - would likely benefit from yag laser but pt wishes to monitor for now -- BCVA 20/20-1 OD  9. Combined form age-related cataract OS-  - The symptoms of cataract, surgical options, and treatments and risks were discussed with patient. - discussed diagnosis and progression - under the expert care of Dr. Herbert Deaner - pt wishes to hold off on CE/IOL OS for now    Ophthalmic Meds Ordered this visit:  No orders of the defined types were placed in this encounter.      Return for f/u 4-6 months, ERM, DFE, OCT.  There are no Patient Instructions on file for this visit.   Explained the diagnoses, plan, and follow up with the patient and they expressed understanding.  Patient expressed understanding of the importance of proper follow up care.   This document serves as a record of services personally performed by Gardiner Sleeper, MD, PhD. It was created on their behalf by Ernest Mallick, OA, an ophthalmic assistant. The creation of this record is the provider's dictation and/or activities during the visit.    Electronically signed by: Ernest Mallick, OA  04.29.2020 12:31 AM    Gardiner Sleeper, M.D., Ph.D. Diseases & Surgery of the Retina and Vitreous [facility]  I have reviewed the  above documentation for accuracy and completeness, and I agree with the above. Gardiner Sleeper, M.D., Ph.D. 07/01/18 12:34 AM     Abbreviations: M myopia (nearsighted); A astigmatism; H hyperopia (farsighted); P presbyopia; Mrx spectacle prescription;  CTL contact lenses; OD right eye; OS left eye; OU both eyes  XT exotropia; ET esotropia; PEK punctate epithelial keratitis; PEE punctate epithelial erosions; DES dry eye syndrome; MGD meibomian gland dysfunction; ATs artificial tears; PFAT's preservative free artificial tears; [place] nuclear sclerotic cataract; PSC posterior subcapsular cataract; ERM epi-retinal membrane; PVD posterior vitreous detachment; RD retinal detachment; DM diabetes mellitus; DR diabetic retinopathy; NPDR non-proliferative diabetic retinopathy; PDR proliferative diabetic retinopathy; CSME clinically significant macular edema; DME diabetic macular edema; dbh dot blot hemorrhages; CWS cotton wool spot; POAG primary open angle glaucoma; C/D cup-to-disc ratio; HVF humphrey visual field; GVF goldmann visual field; OCT optical  coherence tomography; IOP intraocular pressure; BRVO Branch retinal vein occlusion; CRVO central retinal vein occlusion; CRAO central retinal artery occlusion; BRAO branch retinal artery occlusion; RT retinal tear; SB scleral buckle; PPV pars plana vitrectomy; VH Vitreous hemorrhage; PRP panretinal laser photocoagulation; IVK intravitreal kenalog; VMT vitreomacular traction; MH Macular hole;  NVD neovascularization of the disc; NVE neovascularization elsewhere; AREDS age related eye disease study; ARMD age related macular degeneration; POAG primary open angle glaucoma; EBMD epithelial/anterior basement membrane dystrophy; ACIOL anterior chamber intraocular lens; IOL intraocular lens; PCIOL posterior chamber intraocular lens; Phaco/IOL phacoemulsification with intraocular lens placement; [place] photorefractive keratectomy; LASIK laser assisted in situ keratomileusis; HTN  hypertension; DM diabetes mellitus; COPD chronic obstructive pulmonary disease

## 2018-06-30 ENCOUNTER — Other Ambulatory Visit: Payer: Self-pay

## 2018-06-30 ENCOUNTER — Ambulatory Visit (INDEPENDENT_AMBULATORY_CARE_PROVIDER_SITE_OTHER): Payer: Federal, State, Local not specified - PPO | Admitting: Ophthalmology

## 2018-06-30 ENCOUNTER — Encounter (INDEPENDENT_AMBULATORY_CARE_PROVIDER_SITE_OTHER): Payer: Self-pay | Admitting: Ophthalmology

## 2018-06-30 DIAGNOSIS — H25812 Combined forms of age-related cataract, left eye: Secondary | ICD-10-CM

## 2018-06-30 DIAGNOSIS — H3581 Retinal edema: Secondary | ICD-10-CM | POA: Diagnosis not present

## 2018-06-30 DIAGNOSIS — H33332 Multiple defects of retina without detachment, left eye: Secondary | ICD-10-CM

## 2018-06-30 DIAGNOSIS — H35371 Puckering of macula, right eye: Secondary | ICD-10-CM | POA: Diagnosis not present

## 2018-06-30 DIAGNOSIS — H26491 Other secondary cataract, right eye: Secondary | ICD-10-CM

## 2018-06-30 DIAGNOSIS — H35413 Lattice degeneration of retina, bilateral: Secondary | ICD-10-CM | POA: Diagnosis not present

## 2018-06-30 DIAGNOSIS — H43812 Vitreous degeneration, left eye: Secondary | ICD-10-CM

## 2018-06-30 DIAGNOSIS — H3321 Serous retinal detachment, right eye: Secondary | ICD-10-CM | POA: Diagnosis not present

## 2018-06-30 DIAGNOSIS — Z961 Presence of intraocular lens: Secondary | ICD-10-CM

## 2018-08-19 ENCOUNTER — Ambulatory Visit (INDEPENDENT_AMBULATORY_CARE_PROVIDER_SITE_OTHER): Payer: Federal, State, Local not specified - PPO | Admitting: Family Medicine

## 2018-08-19 ENCOUNTER — Other Ambulatory Visit: Payer: Self-pay

## 2018-08-19 ENCOUNTER — Encounter: Payer: Self-pay | Admitting: Family Medicine

## 2018-08-19 VITALS — BP 124/82 | HR 84 | Temp 98.5°F | Resp 18 | Ht 62.0 in | Wt 163.0 lb

## 2018-08-19 DIAGNOSIS — L03317 Cellulitis of buttock: Secondary | ICD-10-CM

## 2018-08-19 MED ORDER — SITZ BATH MISC: 0 refills | Status: DC

## 2018-08-19 MED ORDER — AMOXICILLIN-POT CLAVULANATE 875-125 MG PO TABS: 1.0000 | ORAL_TABLET | Freq: Two times a day (BID) | ORAL | 0 refills | Status: DC

## 2018-08-19 MED ORDER — MUPIROCIN CALCIUM 2 % EX CREA: 1.0000 "application " | TOPICAL_CREAM | Freq: Two times a day (BID) | CUTANEOUS | 0 refills | Status: DC

## 2018-08-19 NOTE — Patient Instructions (Signed)
Start epson salt soaks at least 15 min 2-3 x a day.  Start using cream twice a day  Start augmentin today.   Follow up in 10 days, sooner if worsening     Cellulitis, Adult  Cellulitis is a skin infection. The infected area is often warm, red, swollen, and sore. It occurs most often in the arms and lower legs. It is very important to get treated for this condition. What are the causes? This condition is caused by bacteria. The bacteria enter through a break in the skin, such as a cut, burn, insect bite, open sore, or crack. What increases the risk? This condition is more likely to occur in people who:  Have a weak body defense system (immune system).  Have open cuts, burns, bites, or scrapes on the skin.  Are older than 52 years of age.  Have a blood sugar problem (diabetes).  Have a long-lasting (chronic) liver disease (cirrhosis) or kidney disease.  Are very overweight (obese).  Have a skin problem, such as: ? Itchy rash (eczema). ? Slow movement of blood in the veins (venous stasis). ? Fluid buildup below the skin (edema).  Have been treated with high-energy rays (radiation).  Use IV drugs. What are the signs or symptoms? Symptoms of this condition include:  Skin that is: ? Red. ? Streaking. ? Spotting. ? Swollen. ? Sore or painful when you touch it. ? Warm.  A fever.  Chills.  Blisters. How is this diagnosed? This condition is diagnosed based on:  Medical history.  Physical exam.  Blood tests.  Imaging tests. How is this treated? Treatment for this condition may include:  Medicines to treat infections or allergies.  Home care, such as: ? Rest. ? Placing cold or warm cloths (compresses) on the skin.  Hospital care, if the condition is very bad. Follow these instructions at home: Medicines  Take over-the-counter and prescription medicines only as told by your doctor.  If you were prescribed an antibiotic medicine, take it as told by your  doctor. Do not stop taking it even if you start to feel better. General instructions   Drink enough fluid to keep your pee (urine) pale yellow.  Do not touch or rub the infected area.  Raise (elevate) the infected area above the level of your heart while you are sitting or lying down.  Place cold or warm cloths on the area as told by your doctor.  Keep all follow-up visits as told by your doctor. This is important. Contact a doctor if:  You have a fever.  You do not start to get better after 1-2 days of treatment.  Your bone or joint under the infected area starts to hurt after the skin has healed.  Your infection comes back. This can happen in the same area or another area.  You have a swollen bump in the area.  You have new symptoms.  You feel ill and have muscle aches and pains. Get help right away if:  Your symptoms get worse.  You feel very sleepy.  You throw up (vomit) or have watery poop (diarrhea) for a long time.  You see red streaks coming from the area.  Your red area gets larger.  Your red area turns dark in color. These symptoms may represent a serious problem that is an emergency. Do not wait to see if the symptoms will go away. Get medical help right away. Call your local emergency services (911 in the [place] Do not drive yourself  to the hospital. Summary  Cellulitis is a skin infection. The area is often warm, red, swollen, and sore.  This condition is treated with medicines, rest, and cold and warm cloths.  Take all medicines only as told by your doctor.  Tell your doctor if symptoms do not start to get better after 1-2 days of treatment. This information is not intended to replace advice given to you by your health care provider. Make sure you discuss any questions you have with your health care provider. Document Released: 08/05/2007 Document Revised: 07/08/2017 Document Reviewed: 07/08/2017 Elsevier Interactive Patient Education  2019 [employer]

## 2018-08-19 NOTE — Progress Notes (Signed)
Gail Duncan , [birth year], 53 y.o., female MRN: [id number] Patient Care Team    Relationship Specialty Notifications Start End  Ma Hillock, DO PCP - General Family Medicine  09/07/16   Marylynn Pearson, MD Consulting Physician Obstetrics and Gynecology  05/13/17     Chief Complaint  Patient presents with  . Wound Check    Pt went on long bike ride without bike shorts. Has open wound on left buttocks. She rested for a week and skin came up and is blistering.      Subjective: Pt presents for an OV with complaints of buttock wound after biking 2 weeks ago. She reports she did not have her biking shorts and biked over 10 miles- 2 days in a row.   Pt has tried Desitin cream to ease their symptoms. She reports it is tender, red and swollen. SHe did form a blister formation.  Depression screen [facility] 2/9 05/13/2017 05/05/2017  Decreased Interest 0 0  Down, Depressed, Hopeless 0 0  PHQ - 2 Score 0 0    Allergies  Allergen Reactions  . 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 Bachelor's degree    Moved to [place], from [place] in July 2016.    EtOH use occasional,, nonsmoker, denies recreational drugs.   Drinks caffeinated beverages, uses herbal remedies, takes daily vitamins   Patient wears her seatbelt, wears a bike,, exercises at least 3 times a week. There is a smoke alarm in her home.   Past Medical History:  Diagnosis Date  . Anemia   . GERD (gastroesophageal reflux disease)   . Hearing loss in right ear    Unknown cause, had full work up with Specialist in [place]  . History of chicken pox    Past Surgical History:  Procedure Laterality Date  . ABDOMINAL EXPLORATION SURGERY    . ABLATION    . DENTAL SURGERY    . EXPLORATORY LAPAROTOMY     "fibrous"  . GAS INSERTION Right 08/09/2017   Procedure: INSERTION OF GAS;  Surgeon: Bernarda Caffey, MD;   Location: [place];  Service: Ophthalmology;  Laterality: Right;  . LASER PHOTO ABLATION Left 08/09/2017   Procedure: LASER PHOTO ABLATION;  Surgeon: Bernarda Caffey, MD;  Location: [place];  Service: Ophthalmology;  Laterality: Left;  . SCLERAL BUCKLE WITH POSSIBLE 25 GAUGE PARS PLANA VITRECTOMY Right 08/09/2017   Procedure: SCLERAL BUCKLE WITH 25 GAUGE PARS PLANA VITRECTOMY AND ENDOLASER;  Surgeon: Bernarda Caffey, MD;  Location: [place];  Service: Ophthalmology;  Laterality: Right;   Family History  Problem Relation Age of Onset  . Diabetes Mother   . Prostate cancer Father   . Hypertension Father   . Retinal detachment Father   . Lung cancer Paternal Grandfather   . Amblyopia Neg Hx   . Blindness Neg Hx   . Cataracts Neg Hx   . Glaucoma Neg Hx   . Macular degeneration Neg Hx   . Strabismus Neg Hx   . Retinitis pigmentosa Neg Hx    Allergies as of 08/19/2018      Reactions   Asa [aspirin] Other (See Comments)   Contraindicated due to medical condition      Medication List       Accurate as of August 19, 2018  2:56 PM. If you have any questions, ask your nurse or doctor.  STOP taking these medications   benzonatate 100 MG capsule Commonly known as: TESSALON Stopped by: Felix Pacinienee Haydan Mansouri, DO   brimonidine 0.2 % ophthalmic solution Commonly known as: ALPHAGAN Stopped by: Felix Pacinienee Auda Finfrock, DO   ketorolac 0.5 % ophthalmic solution Commonly known as: ACULAR Stopped by: Felix Pacinienee Mitzi Lilja, DO     TAKE these medications   amoxicillin-clavulanate 875-125 MG tablet Commonly known as: AUGMENTIN Take 1 tablet by mouth 2 (two) times daily.   Anucort-HC 25 MG suppository Generic drug: hydrocortisone Place 25 mg rectally 2 (two) times daily as needed for hemorrhoids or anal itching.   atropine 1 % ophthalmic solution Place 1 drop into the right eye 2 (two) times daily at 10 am and 4 pm.   chlorpheniramine 4 MG tablet Commonly known as: CHLOR-TRIMETON Take 4 mg by mouth 2 (two) times daily as  needed for allergies.   CoQ10-Vitamin E 100-10 MG-UNIT Caps Take 1 capsule by mouth daily.   dorzolamide-timolol 22.3-6.8 MG/ML ophthalmic solution Commonly known as: COSOPT Place 1 drop into the right eye 2 (two) times daily.   mupirocin cream 2 % Commonly known as: Bactroban Apply 1 application topically 2 (two) times daily. Started by: Felix Pacinienee Ordean Fouts, DO   pantoprazole 40 MG tablet Commonly known as: PROTONIX Take 40 mg by mouth daily.   Sitz Bath Misc Soak 15 min 2-3x a day. Started by: Felix Pacinienee Angeletta Goelz, DO   Vitamin D3 125 MCG (5000 UT) Caps Take 5,000 capsules by mouth 3 (three) times a week.       All past medical history, surgical history, allergies, family history, immunizations andmedications were updated in the EMR today and reviewed under the history and medication portions of their EMR.     ROS: Negative, with the exception of above mentioned in HPI   Objective:  BP 124/82 (BP Location: Left Arm, Patient Position: Sitting, Cuff Size: Normal)   Pulse 84   Temp 98.5 F (36.9 C) (Temporal)   Resp 18   Ht 5\' 2"  (1.575 m)   Wt 163 lb (73.9 kg)   LMP 12/22/2014   SpO2 99%   BMI 29.81 kg/m  Body mass index is 29.81 kg/m. Gen: Afebrile. No acute distress. Nontoxic in appearance, well developed, well nourished.  HENT: AT. [place]. MMM Eyes: Conjunctiva without redness, discharge or icterus. Skin: right medial buttocks/ perineal region. With erythema, induration, mild drainage, TTp ,No purpura or petechiae.  Neuro: Normal gait. PERLA. EOMi. Alert. Oriented x3    No exam data present No results found. No results found for this or any previous visit (from the past 24 hour(s)).  Assessment/Plan: Gail Duncan is a 53 y.o. female present for OV for  Cellulitis of buttock - cellulitis with minimal  Drainage with palpation. - sitz bath TID prescribed.  - augmentin BID x 10d - Bactroban ointment - f/u 10 days, sooner if worsening.     Reviewed expectations re:  course of current medical issues.  Discussed self-management of symptoms.  Outlined signs and symptoms indicating need for more acute intervention.  Patient verbalized understanding and all questions were answered.  Patient received an After-Visit Summary.   > 15 minutes spent with patient, > 50% of that time face to face   No orders of the defined types were placed in this encounter.    Note is dictated utilizing voice recognition software. Although note has been proof read prior to signing, occasional typographical errors still can be missed. If any questions arise, please do not hesitate to call  for verification.   electronically signed by:  Howard Pouch, DO  [place]

## 2018-08-24 DIAGNOSIS — K219 Gastro-esophageal reflux disease without esophagitis: Secondary | ICD-10-CM | POA: Diagnosis not present

## 2018-09-01 ENCOUNTER — Ambulatory Visit (INDEPENDENT_AMBULATORY_CARE_PROVIDER_SITE_OTHER): Payer: Federal, State, Local not specified - PPO | Admitting: Family Medicine

## 2018-09-01 ENCOUNTER — Other Ambulatory Visit: Payer: Self-pay

## 2018-09-01 ENCOUNTER — Encounter: Payer: Self-pay | Admitting: Family Medicine

## 2018-09-01 VITALS — BP 125/85 | HR 81 | Temp 98.6°F | Resp 18 | Ht 63.0 in | Wt 163.1 lb

## 2018-09-01 DIAGNOSIS — L03317 Cellulitis of buttock: Secondary | ICD-10-CM | POA: Diagnosis not present

## 2018-09-01 MED ORDER — MUPIROCIN CALCIUM 2 % EX CREA: 1.0000 "application " | TOPICAL_CREAM | Freq: Two times a day (BID) | CUTANEOUS | 0 refills | Status: DC

## 2018-09-01 MED ORDER — AMOXICILLIN-POT CLAVULANATE 875-125 MG PO TABS: 1.0000 | ORAL_TABLET | Freq: Two times a day (BID) | ORAL | 0 refills | Status: DC

## 2018-09-01 NOTE — Patient Instructions (Signed)
I am glad you are feeling better.  Repeat Augmentin x 10 days- I think it will take care of it.   Follow up in 2 weeks if any symptoms remain.   It looks much better.

## 2018-09-01 NOTE — Progress Notes (Signed)
Gail Duncan , 01-05-1966, 53 y.o., female MRN: [id number] Patient Care Team    Relationship Specialty Notifications Start End  Gail Duncan, Gail Bezanson A, DO PCP - General Family Medicine  09/07/16   Gail Duncan, Gretchen, MD Consulting Physician Obstetrics and Gynecology  05/13/17     Chief Complaint  Patient presents with  . Follow-up    Pt is doing better and wound is healing, it is not 100% better though and still irritating her      Subjective: Pt presents for an OV follow-up on cellulitis.   Gail Duncan returns today and reports her symptoms are about 50% better.  She states the redness and swelling have almost completely resolved.  She still has an area near the center of the original cellulitis that is still rather tender.  She denies any further drainage.  She finished the antibiotics 3 days ago.  She is continuing use the Bactroban ointment.  She denies any continued drainage.  She denies any fever or chills.   Prior note: With complaints of buttock wound after biking 2 weeks ago. She reports she did not have her biking shorts and biked over 10 miles- 2 days in Duncan row.   Pt has tried Desitin cream to ease their symptoms. She reports it is tender, red and swollen. SHe did form Duncan blister formation.  [facility] 05/13/2017 05/05/2017  Decreased Interest 0 0  Down, Depressed, Hopeless 0 0  PHQ - 2 Score 0 0    Allergies  Allergen Reactions  . 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 Bachelor's degree    [place]oved to [place] from [place] in July 2016.    EtOH use occasional,, nonsmoker, denies recreational drugs.   Drinks caffeinated beverages, uses herbal remedies, takes daily vitamins   Patient wears her seatbelt, wears Duncan bike,, exercises at least 3 times Duncan week. There is Duncan smoke alarm in her home.   Past Medical History:  Diagnosis Date  . Anemia    . GERD (gastroesophageal reflux disease)   . Hearing loss in right ear    Unknown cause, had full work up with Specialist in [place]  . History of chicken pox    Past Surgical History:  Procedure Laterality Date  . ABDOMINAL EXPLORATION SURGERY    . ABLATION    . DENTAL SURGERY    . EXPLORATORY LAPAROTOMY     "fibrous"  . GAS INSERTION Right 08/09/2017   Procedure: INSERTION OF GAS;  Surgeon: Rennis ChrisZamora, Brian, MD;  [facility];  Service: Ophthalmology;  Laterality: Right;  . LASER PHOTO ABLATION Left 08/09/2017   Procedure: LASER PHOTO ABLATION;  Surgeon: Rennis ChrisZamora, Brian, MD;  [facility];  Service: Ophthalmology;  Laterality: Left;  . SCLERAL BUCKLE WITH POSSIBLE 25 GAUGE PARS PLANA VITRECTOMY Right 08/09/2017   Procedure: SCLERAL BUCKLE WITH 25 GAUGE PARS PLANA VITRECTOMY AND ENDOLASER;  Surgeon: Rennis ChrisZamora, Brian, MD;  [facility];  Service: Ophthalmology;  Laterality: Right;   Family History  Problem Relation Age of Onset  . Diabetes Mother   . Prostate cancer Father   . Hypertension Father   . Retinal detachment Father   . Lung cancer Paternal Grandfather   . Amblyopia Neg Hx   . Blindness Neg Hx   . Cataracts Neg Hx   . Glaucoma Neg Hx   . Macular degeneration Neg Hx   .  Strabismus Neg Hx   . Retinitis pigmentosa Neg Hx    Allergies as of 09/01/2018      Reactions   Asa [aspirin] Other (See Comments)   Contraindicated due to medical condition      Medication List       Accurate as of September 01, 2018 11:40 AM. If you have any questions, ask your nurse or doctor.        amoxicillin-clavulanate 875-125 MG tablet Commonly known as: AUGMENTIN Take 1 tablet by mouth 2 (two) times daily.   Anucort-HC 25 MG suppository Generic drug: hydrocortisone Place 25 mg rectally 2 (two) times daily as needed for hemorrhoids or anal itching.   atropine 1 % ophthalmic solution Place 1 drop into the right eye 2 (two) times daily at 10 am and 4 pm.   chlorpheniramine 4 MG tablet  Commonly known as: CHLOR-TRIMETON Take 4 mg by mouth 2 (two) times daily as needed for allergies.   CoQ10-Vitamin E 100-10 MG-UNIT Caps Take 1 capsule by mouth daily.   dorzolamide-timolol 22.3-6.8 MG/ML ophthalmic solution Commonly known as: COSOPT Place 1 drop into the right eye 2 (two) times daily.   mupirocin cream 2 % Commonly known as: Bactroban Apply 1 application topically 2 (two) times daily.   pantoprazole 40 MG tablet Commonly known as: PROTONIX Take 40 mg by mouth daily.   Sitz Bath Misc Soak 15 min 2-3x Duncan day.   Vitamin D3 125 MCG (5000 UT) Caps Take 5,000 capsules by mouth 3 (three) times Duncan week.       All past medical history, surgical history, allergies, family history, immunizations andmedications were updated in the EMR today and reviewed under the history and medication portions of their EMR.     ROS: Negative, with the exception of above mentioned in HPI   Objective:  BP 125/85 (BP Location: Left Arm, Patient Position: Sitting, Cuff Size: Normal)   Pulse 81   Temp 98.6 F (37 C) (Temporal)   Resp 18   Ht 5\' 3"  (1.6 m)   Wt 163 lb 2 oz (74 kg)   LMP 12/22/2014   SpO2 100%   BMI 28.90 kg/m  Body mass index is 28.9 kg/m. Gen: Afebrile. No acute distress.  No acute distress.  Appears more comfortable today than prior appointment. HENT: AT. [place]. MMM.  Skin: No erythema, no soft tissue swelling remains.  Wound is well-healing, no drainage.  Possibly very small abscess formation remains near center of original area of cellulitis and wound.  She is tender over this area. Neuro: Normal gait. PERLA. EOMi. Alert. Oriented x3  No exam data present No results found. No results found for this or any previous visit (from the past 24 hour(s)).  Assessment/Plan: Gail Duncan is Duncan 53 y.o. female present for OV for  Cellulitis of buttock -The cellulitis is completely resolved.  There may be Duncan small abscess formation left-and this is the area of her  tenderness.  We discussed options today including another round of antibiotics and continuing treatment versus referral to specialist.  And she did want to try another round of antibiotics.  I do think there is Duncan good possibility she will get complete resolution with the treatment above. - Continue sitz bath TID prescribed.  - rpt one additional round augmentin BID x 10d  - Bactroban ointment continued- refilled.  - 10d - 2 weeks.if not completely resolved.     Reviewed expectations re: course of current medical issues.  Discussed self-management of  symptoms.  Outlined signs and symptoms indicating need for more acute intervention.  Patient verbalized understanding and all questions were answered.  Patient received an After-Visit Summary.   > 15 minutes spent with patient, > 50% of that time face to face   No orders of the defined types were placed in this encounter.    Note is dictated utilizing voice recognition software. Although note has been proof read prior to signing, occasional typographical errors still can be missed. If any questions arise, please do not hesitate to call for verification.   electronically signed by:  Howard Pouch, DO  [place]

## 2018-09-05 ENCOUNTER — Telehealth: Payer: Self-pay

## 2018-09-05 DIAGNOSIS — L0231 Cutaneous abscess of buttock: Secondary | ICD-10-CM

## 2018-09-05 NOTE — Telephone Encounter (Signed)
Copied from [place] [id number]. Topic: Appointment Scheduling - Scheduling Inquiry for Clinic >> Sep 05, 2018 11:15 AM Edmonia Caprio wrote: Reason for CRM:  Wants to see specialist./ Cellulitis of buttock is not any better  Pt asking for referral just be sent for wound. Pt sent my chart message letting her know message was going to be sent to Dr Raoul Pitch to review.   Please advise

## 2018-09-07 NOTE — Telephone Encounter (Signed)
Pt was called due to not reading my chart message

## 2018-09-07 NOTE — Telephone Encounter (Signed)
Pt was sent Mychart message.

## 2018-09-07 NOTE — Addendum Note (Signed)
Addended by: Howard Pouch A on: 09/07/2018 09:01 AM   Modules accepted: Orders

## 2018-09-07 NOTE — Telephone Encounter (Addendum)
Referred to surgery- for eval and possible I&D>> please make pt aware

## 2018-09-09 DIAGNOSIS — L0291 Cutaneous abscess, unspecified: Secondary | ICD-10-CM | POA: Diagnosis not present

## 2018-09-26 ENCOUNTER — Encounter: Payer: Self-pay | Admitting: Family Medicine

## 2018-09-30 ENCOUNTER — Other Ambulatory Visit: Payer: Self-pay

## 2018-09-30 ENCOUNTER — Encounter: Payer: Self-pay | Admitting: Family Medicine

## 2018-09-30 ENCOUNTER — Ambulatory Visit (INDEPENDENT_AMBULATORY_CARE_PROVIDER_SITE_OTHER): Payer: Federal, State, Local not specified - PPO | Admitting: Family Medicine

## 2018-09-30 VITALS — BP 107/75 | HR 72 | Temp 98.1°F | Resp 17 | Ht 62.5 in | Wt 159.1 lb

## 2018-09-30 DIAGNOSIS — Z1239 Encounter for other screening for malignant neoplasm of breast: Secondary | ICD-10-CM

## 2018-09-30 DIAGNOSIS — K2100 Gastro-esophageal reflux disease with esophagitis, without bleeding: Secondary | ICD-10-CM

## 2018-09-30 DIAGNOSIS — R5383 Other fatigue: Secondary | ICD-10-CM | POA: Diagnosis not present

## 2018-09-30 DIAGNOSIS — Z131 Encounter for screening for diabetes mellitus: Secondary | ICD-10-CM

## 2018-09-30 DIAGNOSIS — Z79899 Other long term (current) drug therapy: Secondary | ICD-10-CM | POA: Diagnosis not present

## 2018-09-30 DIAGNOSIS — Z0000 Encounter for general adult medical examination without abnormal findings: Secondary | ICD-10-CM | POA: Diagnosis not present

## 2018-09-30 DIAGNOSIS — K449 Diaphragmatic hernia without obstruction or gangrene: Secondary | ICD-10-CM

## 2018-09-30 DIAGNOSIS — E663 Overweight: Secondary | ICD-10-CM

## 2018-09-30 DIAGNOSIS — E611 Iron deficiency: Secondary | ICD-10-CM

## 2018-09-30 LAB — COMPREHENSIVE METABOLIC PANEL
ALT: 14 U/L (ref 0–35)
AST: 15 U/L (ref 0–37)
Albumin: 4.6 g/dL (ref 3.5–5.2)
Alkaline Phosphatase: 73 U/L (ref 39–117)
BUN: 11 mg/dL (ref 6–23)
CO2: 29 mEq/L (ref 19–32)
Calcium: 9.9 mg/dL (ref 8.4–10.5)
Chloride: 106 mEq/L (ref 96–112)
Creatinine, Ser: 0.72 mg/dL (ref 0.40–1.20)
GFR: 84.95 mL/min (ref >60.00)
Glucose, Bld: 78 mg/dL (ref 70–99)
Potassium: 4.6 mEq/L (ref 3.5–5.1)
Sodium: 143 mEq/L (ref 135–145)
Total Bilirubin: 0.7 mg/dL (ref 0.2–1.2)
Total Protein: 7.4 g/dL (ref 6.0–8.3)

## 2018-09-30 LAB — LIPID PANEL
Cholesterol: 242 mg/dL — ABNORMAL HIGH (ref 0–200)
HDL: 99.9 mg/dL (ref >39.00)
LDL Cholesterol: 131 mg/dL — ABNORMAL HIGH (ref 0–99)
NonHDL: 141.91
Total CHOL/HDL Ratio: 2
Triglycerides: 57 mg/dL (ref 0.0–149.0)
VLDL: 11.4 mg/dL (ref 0.0–40.0)

## 2018-09-30 LAB — HEMOGLOBIN A1C: Hgb A1c MFr Bld: 4.9 % (ref 4.6–6.5)

## 2018-09-30 LAB — CBC
HCT: 38.5 % (ref 36.0–46.0)
Hemoglobin: 12.8 g/dL (ref 12.0–15.0)
MCHC: 33.3 g/dL (ref 30.0–36.0)
MCV: 88 fl (ref 78.0–100.0)
Platelets: 220 10*3/uL (ref 150.0–400.0)
RBC: 4.38 Mil/uL (ref 3.87–5.11)
RDW: 13.2 % (ref 11.5–15.5)
WBC: 4.6 10*3/uL (ref 4.0–10.5)

## 2018-09-30 LAB — TSH: TSH: 0.84 u[IU]/mL (ref 0.35–4.50)

## 2018-09-30 LAB — MAGNESIUM: Magnesium: 2.3 mg/dL (ref 1.5–2.5)

## 2018-09-30 LAB — VITAMIN D 25 HYDROXY (VIT D DEFICIENCY, FRACTURES): VITD: 44.43 ng/mL (ref 30.00–100.00)

## 2018-09-30 LAB — VITAMIN B12: Vitamin B-12: 451 pg/mL (ref 211–911)

## 2018-09-30 NOTE — Patient Instructions (Signed)
Health Maintenance, Female Adopting a healthy lifestyle and getting preventive care are important in promoting health and wellness. Ask your health care provider about:  The right schedule for you to have regular tests and exams.  Things you can do on your own to prevent diseases and keep yourself healthy. What should I know about diet, weight, and exercise? Eat a healthy diet   Eat a diet that includes plenty of vegetables, fruits, low-fat dairy products, and lean protein.  Do not eat a lot of foods that are high in solid fats, added sugars, or sodium. Maintain a healthy weight Body mass index (BMI) is used to identify weight problems. It estimates body fat based on height and weight. Your health care provider can help determine your BMI and help you achieve or maintain a healthy weight. Get regular exercise Get regular exercise. This is one of the most important things you can do for your health. Most adults should:  Exercise for at least 150 minutes each week. The exercise should increase your heart rate and make you sweat (moderate-intensity exercise).  Do strengthening exercises at least twice a week. This is in addition to the moderate-intensity exercise.  Spend less time sitting. Even light physical activity can be beneficial. Watch cholesterol and blood lipids Have your blood tested for lipids and cholesterol at 52 years of age, then have this test every 5 years. Have your cholesterol levels checked more often if:  Your lipid or cholesterol levels are high.  You are older than 52 years of age.  You are at high risk for heart disease. What should I know about cancer screening? Depending on your health history and family history, you may need to have cancer screening at various ages. This may include screening for:  Breast cancer.  Cervical cancer.  Colorectal cancer.  Skin cancer.  Lung cancer. What should I know about heart disease, diabetes, and high blood  pressure? Blood pressure and heart disease  High blood pressure causes heart disease and increases the risk of stroke. This is more likely to develop in people who have high blood pressure readings, are of African descent, or are overweight.  Have your blood pressure checked: ? Every 3-5 years if you are 18-39 years of age. ? Every year if you are 40 years old or older. Diabetes Have regular diabetes screenings. This checks your fasting blood sugar level. Have the screening done:  Once every three years after age 40 if you are at a normal weight and have a low risk for diabetes.  More often and at a younger age if you are overweight or have a high risk for diabetes. What should I know about preventing infection? Hepatitis B If you have a higher risk for hepatitis B, you should be screened for this virus. Talk with your health care provider to find out if you are at risk for hepatitis B infection. Hepatitis C Testing is recommended for:  Everyone born from 1945 through 1965.  Anyone with known risk factors for hepatitis C. Sexually transmitted infections (STIs)  Get screened for STIs, including gonorrhea and chlamydia, if: ? You are sexually active and are younger than 52 years of age. ? You are older than 52 years of age and your health care provider tells you that you are at risk for this type of infection. ? Your sexual activity has changed since you were last screened, and you are at increased risk for chlamydia or gonorrhea. Ask your health care provider if   you are at risk.  Ask your health care provider about whether you are at high risk for HIV. Your health care provider may recommend a prescription medicine to help prevent HIV infection. If you choose to take medicine to prevent HIV, you should first get tested for HIV. You should then be tested every 3 months for as long as you are taking the medicine. Pregnancy  If you are about to stop having your period (premenopausal) and  you may become pregnant, seek counseling before you get pregnant.  Take 400 to 800 micrograms (mcg) of folic acid every day if you become pregnant.  Ask for birth control (contraception) if you want to prevent pregnancy. Osteoporosis and menopause Osteoporosis is a disease in which the bones lose minerals and strength with aging. This can result in bone fractures. If you are 97 years old or older, or if you are at risk for osteoporosis and fractures, ask your health care provider if you should:  Be screened for bone loss.  Take a calcium or vitamin D supplement to lower your risk of fractures.  Be given hormone replacement therapy (HRT) to treat symptoms of menopause. Follow these instructions at home: Lifestyle  Do not use any products that contain nicotine or tobacco, such as cigarettes, e-cigarettes, and chewing tobacco. If you need help quitting, ask your health care provider.  Do not use street drugs.  Do not share needles.  Ask your health care provider for help if you need support or information about quitting drugs. Alcohol use  Do not drink alcohol if: ? Your health care provider tells you not to drink. ? You are pregnant, may be pregnant, or are planning to become pregnant.  If you drink alcohol: ? Limit how much you use to 0-1 drink a day. ? Limit intake if you are breastfeeding.  Be aware of how much alcohol is in your drink. In the [place] one drink equals one 12 oz bottle of beer (355 mL), one 5 oz glass of wine (148 mL), or one 1 oz glass of hard liquor (44 mL). General instructions  Schedule regular health, dental, and eye exams.  Stay current with your vaccines.  Tell your health care provider if: ? You often feel depressed. ? You have ever been abused or do not feel safe at home. Summary  Adopting a healthy lifestyle and getting preventive care are important in promoting health and wellness.  Follow your health care provider's instructions about healthy  diet, exercising, and getting tested or screened for diseases.  Follow your health care provider's instructions on monitoring your cholesterol and blood pressure. This information is not intended to replace advice given to you by your health care provider. Make sure you discuss any questions you have with your health care provider. Document Released: 09/01/2010 Document Revised: 02/09/2018 Document Reviewed: 02/09/2018 Elsevier Patient Education  2020 [place].    BMI for Adults Your bmi Body mass index is 28.64 kg/m.  Body mass index (BMI) is a number that is calculated from a person's weight and height. BMI may help to estimate how much of a person's weight is composed of fat. BMI can help identify those who may be at higher risk for certain medical problems. How is BMI used with adults? BMI is used as a screening tool to identify possible weight problems. It is used to check whether a person is obese, overweight, healthy weight, or underweight. How is BMI calculated? BMI measures your weight and compares it to  your height. This can be done either in [place] ([place] or metric measurements. Note that charts are available to help you find your BMI quickly and easily without having to do these calculations yourself. To calculate your BMI in English ([place] measurements, your health care provider will: 1. Measure your weight in pounds (lb). 2. Multiply the number of pounds by 703. ? For example, for a person who weighs 180 lb, multiply that number by 703, which equals 126,540. 3. Measure your height in inches (in). Then multiply that number by itself to get a measurement called "inches squared." ? For example, for a person who is 70 in tall, the "inches squared" measurement is 70 in x 70 in, which equals 4900 inches squared. 4. Divide the total from Step 2 (number of lb x 703) by the total from Step 3 (inches squared): 126,540  4900 = 25.8. This is your BMI. To calculate your BMI in metric  measurements, your health care provider will: 1. Measure your weight in kilograms (kg). 2. Measure your height in meters (m). Then multiply that number by itself to get a measurement called "meters squared." ? For example, for a person who is 1.75 m tall, the "meters squared" measurement is 1.75 m x 1.75 m, which is equal to 3.1 meters squared. 3. Divide the number of kilograms (your weight) by the meters squared number. In this example: 70  3.1 = 22.6. This is your BMI. How is BMI interpreted? To interpret your results, your health care provider will use BMI charts to identify whether you are underweight, normal weight, overweight, or obese. The following guidelines will be used:  Underweight: BMI less than 18.5.  Normal weight: BMI between 18.5 and 24.9.  Overweight: BMI between 25 and 29.9.  Obese: BMI of 30 and above. Please note:  Weight includes both fat and muscle, so someone with a muscular build, such as an athlete, may have a BMI that is higher than 24.9. In cases like these, BMI is not an accurate measure of body fat.  To determine if excess body fat is the cause of a BMI of 25 or higher, further assessments may need to be done by a health care provider.  BMI is usually interpreted in the same way for men and women. Why is BMI a useful tool? BMI is useful in two ways:  Identifying a weight problem that may be related to a medical condition, or that may increase the risk for medical problems.  Promoting lifestyle and diet changes in order to reach a healthy weight. Summary  Body mass index (BMI) is a number that is calculated from a person's weight and height.  BMI may help to estimate how much of a person's weight is composed of fat. BMI can help identify those who may be at higher risk for certain medical problems.  BMI can be measured using English measurements or metric measurements.  To interpret your results, your health care provider will use BMI charts to  identify whether you are underweight, normal weight, overweight, or obese. This information is not intended to replace advice given to you by your health care provider. Make sure you discuss any questions you have with your health care provider. Document Released: 10/29/2003 Document Revised: 01/29/2017 Document Reviewed: 12/30/2016 Elsevier Patient Education  2020 [employer]

## 2018-09-30 NOTE — Progress Notes (Signed)
Patient ID: Gail MatarMarianna Duncan, female  DOB: 06-05-1965, 53 y.o.   MRN: [id number] Patient Care Team    Relationship Specialty Notifications Start End  Natalia LeatherwoodKuneff, La Shehan A, DO PCP - General Family Medicine  09/07/16   Zelphia CairoAdkins, Gretchen, MD Consulting Physician Obstetrics and Gynecology  05/13/17     Chief Complaint  Patient presents with  . Annual Exam    Fasting. Needs mammogram. Pap smear due this year, last one 3 yrs, Dr Zelphia CairoGretchen Adkins     Subjective:  Gail Duncan is a [age] y.o.  Female  present for CPE. All past medical history, surgical history, allergies, family history, immunizations, medications and social history were updated in the electronic medical record today. All recent labs, ED visits and hospitalizations within the last year were reviewed.  Health maintenance:  Col[facility]: 06/2017-  [facility]. 10 yr follow up Mammogram: 05/2017- normal- breast center, Indicated, a family history, no personal history. ordered Cervical cancer screening:  patient states she has had history of abnormal Paps. She has had at least 2 normal Paps consecutively. She states she had a PAP 2.5 years ago and is due this year (Dr. Renaldo FiddlerAdkins) Immunizations: Tdap UTD 04/2017, Declined flu shot. Shingrix declined Infectious disease screening: HIV completed with pregnancy.  Assistive device: none Oxygen WUJ:WJXBuse:none Patient has a Dental home. Hospitalizations/ED visits: reviewed   [facility] 09/30/2018 05/13/2017 05/05/2017  Decreased Interest 0 0 0  Down, Depressed, Hopeless 0 0 0  PHQ - 2 Score 0 0 0   No flowsheet data found.   Immunization History  Administered Date(s) Administered  . Tdap 05/13/2017    Past Medical History:  Diagnosis Date  . Anemia   . GERD (gastroesophageal reflux disease)   . Hearing loss in right ear    Unknown cause, had full work up with Specialist in [place]  . History of chicken pox    Allergies  Allergen Reactions  . Asa [Aspirin] Other (See Comments)   Contraindicated due to medical condition   Past Surgical History:  Procedure Laterality Date  . ABDOMINAL EXPLORATION SURGERY    . ABLATION    . DENTAL SURGERY    . EXPLORATORY LAPAROTOMY     "fibrous"  . GAS INSERTION Right 08/09/2017   Procedure: INSERTION OF GAS;  Surgeon: Rennis ChrisZamora, Brian, MD;  [facility];  Service: Ophthalmology;  Laterality: Right;  . LASER PHOTO ABLATION Left 08/09/2017   Procedure: LASER PHOTO ABLATION;  Surgeon: Rennis ChrisZamora, Brian, MD;  [facility];  Service: Ophthalmology;  Laterality: Left;  . SCLERAL BUCKLE WITH POSSIBLE 25 GAUGE PARS PLANA VITRECTOMY Right 08/09/2017   Procedure: SCLERAL BUCKLE WITH 25 GAUGE PARS PLANA VITRECTOMY AND ENDOLASER;  Surgeon: Rennis ChrisZamora, Brian, MD;  [facility];  Service: Ophthalmology;  Laterality: Right;   Family History  Problem Relation Age of Onset  . Diabetes Mother   . Prostate cancer Father   . Hypertension Father   . Retinal detachment Father   . Lung cancer Paternal Grandfather   . Amblyopia Neg Hx   . Blindness Neg Hx   . Cataracts Neg Hx   . Glaucoma Neg Hx   . Macular degeneration Neg Hx   . Strabismus Neg Hx   . 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 Bachelor's degree    [place]oved to [place] in July 2016.    EtOH use occasional,, nonsmoker, denies  recreational drugs.   Drinks caffeinated beverages, uses herbal remedies, takes daily vitamins   Patient wears her seatbelt, wears a bike,, exercises at least 3 times a week. There is a smoke alarm in her home.    Allergies as of 09/30/2018      Reactions   Asa [aspirin] Other (See Comments)   Contraindicated due to medical condition      Medication List       Accurate as of September 30, 2018  8:40 AM. If you have any questions, ask your nurse or doctor.        STOP taking these medications   amoxicillin-clavulanate 875-125 MG tablet Commonly known as: AUGMENTIN  Stopped by: Howard Pouch, DO   mupirocin cream 2 % Commonly known as: Bactroban Stopped by: Howard Pouch, DO   [place] by: Howard Pouch, DO     TAKE these medications   Anucort-HC 25 MG suppository Generic drug: hydrocortisone Place 25 mg rectally 2 (two) times daily as needed for hemorrhoids or anal itching.   atropine 1 % ophthalmic solution Place 1 drop into the right eye 2 (two) times daily at 10 am and 4 pm.   chlorpheniramine 4 MG tablet Commonly known as: CHLOR-TRIMETON Take 4 mg by mouth 2 (two) times daily as needed for allergies.   CoQ10-Vitamin E 100-10 MG-UNIT Caps Take 1 capsule by mouth daily.   dorzolamide-timolol 22.3-6.8 MG/ML ophthalmic solution Commonly known as: COSOPT Place 1 drop into the right eye 2 (two) times daily.   pantoprazole 40 MG tablet Commonly known as: PROTONIX Take 40 mg by mouth daily.   Vitamin D3 125 MCG (5000 UT) Caps Take 5,000 capsules by mouth 3 (three) times a week.       All past medical history, surgical history, allergies, family history, immunizations andmedications were updated in the EMR today and reviewed under the history and medication portions of their EMR.     No results found for this or any previous visit (from the past 2160 hour(s)).   ROS: 14 pt review of systems performed and negative (unless mentioned in an HPI)  Objective: BP 107/75 (BP Location: Left Arm, Patient Position: Sitting, Cuff Size: Normal)   Pulse 72   Temp 98.1 F (36.7 C) (Temporal)   Resp 17   Ht 5' 2.5" (1.588 m)   Wt 159 lb 2 oz (72.2 kg)   LMP 12/22/2014   SpO2 100%   BMI 28.64 kg/m  Gen: Afebrile. No acute distress. Nontoxic in appearance, well-developed, well-nourished,  Pleasant overweight, caucasian female HENT: AT. [place]. Bilateral TM visualized and normal in appearance, normal external auditory canal. MMM, no oral lesions, adequate dentition. Bilateral nares within normal limits. Throat without erythema,  ulcerations or exudates. no Cough on exam, no hoarseness on exam. Eyes:Pupils Equal Round Reactive to light, Extraocular movements intact,  Conjunctiva without redness, discharge or icterus. Neck/lymp/endocrine: Supple,no lymphadenopathy, no thyromegaly CV: RRR no murmur, no edema, +2/4 P posterior tibialis pulses. no carotid bruits. No JVD. Chest: CTAB, no wheeze, rhonchi or crackles. normal Respiratory effort. good Air movement. Abd: Soft. flat. NTND. BS present. no Masses palpated. No hepatosplenomegaly. No rebound tenderness or guarding. Skin: no rashes, purpura or petechiae. Warm and well-perfused. Skin intact. Neuro/Msk:  Normal gait. PERLA. EOMi. Alert. Oriented x3.  Cranial nerves II through XII intact. Muscle strength 5/5 upper/lower extremity. DTRs equal bilaterally. Psych: Normal affect, dress and demeanor. Normal speech. Normal thought content and judgment.   No exam data present  Assessment/plan: Gail Duncan is a 53 y.o. female present for CPE Encounter for preventive health examination Patient was encouraged to exercise greater than 150 minutes a week. Patient was encouraged to choose a diet filled with fresh fruits and vegetables, and lean meats. AVS provided to patient today for education/recommendation on gender specific health and safety maintenance. [facility]: [facility]2017-  [facility] 10 yr follow up Mammogram: 05/2017- normal- breast center, Indicated, a family history, no personal history. ordered Cervical cancer screening:  patient states she has had history of abnormal Paps. She has had at least 2 normal Paps consecutively. She states she had a PAP 2.5 years ago and is due this year (Dr. Renaldo FiddlerAdkins) Immunizations: Tdap UTD 04/2017, Declined flu shot. Shingrix declined Infectious disease screening: HIV completed with pregnancy.  Overweight (BMI 25.0-29.9) - Lipid panel Iron deficiency - CBC - Iron, TIBC and Ferritin Panel Diabetes mellitus screening - Hemoglobin A1c  Breast cancer screening - MM 3D SCREEN BREAST BILATERAL; Future Encounter for long-term current use of medication/Hiatal hernia/Reflux esophagitis - uses OTC protonix 5-7 days a week - Comprehensive metabolic panel - Vitamin D (25 hydroxy) - B12 - Magnesium Use of proton pump inhibitor therapy - Vitamin D (25 hydroxy) - B12 - Magnesium fatigue - TSH    Return in about 1 year (around 09/30/2019) for CPE (30 min).  Electronically signed by: Felix Pacinienee Anavey Coombes, DO [facility]

## 2018-10-01 LAB — IRON,TIBC AND FERRITIN PANEL
%SAT: 31 % (calc) (ref 16–45)
Ferritin: 147 ng/mL (ref 16–232)
Iron: 92 ug/dL (ref 45–160)
TIBC: 293 mcg/dL (calc) (ref 250–450)

## 2018-11-29 NOTE — Progress Notes (Signed)
[facility] Note  11/30/2018     CHIEF COMPLAINT Patient presents for Retina Follow Up   HISTORY OF PRESENT ILLNESS: Gail Duncan is a 53 y.o. female who presents to the clinic today for:   HPI    Retina Follow Up    Patient presents with  Other.  In left eye.  This started weeks ago.  Severity is moderate.  Duration of weeks.  Since onset it is stable.  I, the attending physician,  performed the HPI with the patient and updated documentation appropriately.          Comments    Patient states her vision is about the same.  Patient states she may have noticed a new floater in her left eye a few days ago.  Patient denies eye pain or discomfort.       Last edited by Bernarda Caffey, MD on 11/30/2018  8:22 AM. (History)    pt states her eyes are doing "pretty good", she has not had any problems, she states she has not seen Dr. Herbert Deaner since she was here last, she has not had YAG in her right eye   Referring physician: Ma Hillock, DO [address] [place],  [place] [address]  HISTORICAL INFORMATION:   Selected notes from the MEDICAL RECORD NUMBER Referred by ED for possible RD   CURRENT MEDICATIONS: Current Outpatient Medications (Ophthalmic Drugs)  Medication Sig  . atropine 1 % ophthalmic solution Place 1 drop into the right eye 2 (two) times daily at 10 am and 4 pm.  . dorzolamide-timolol (COSOPT) 22.3-6.8 MG/ML ophthalmic solution Place 1 drop into the right eye 2 (two) times daily.   No current facility-administered medications for this visit.  (Ophthalmic Drugs)   Current Outpatient Medications (Other)  Medication Sig  . ANUCORT-HC 25 MG suppository Place 25 mg rectally 2 (two) times daily as needed for hemorrhoids or anal itching.   . chlorpheniramine (CHLOR-TRIMETON) 4 MG tablet Take 4 mg by mouth 2 (two) times daily as needed for allergies.  . Cholecalciferol (VITAMIN D3) 5000 units CAPS Take 5,000 capsules by mouth 3 (three) times a week.    . Coenzyme Q10-Vitamin E (COQ10-VITAMIN E) 100-10 MG-UNIT CAPS Take 1 capsule by mouth daily.   . pantoprazole (PROTONIX) 40 MG tablet Take 40 mg by mouth daily.   No current facility-administered medications for this visit.  (Other)      REVIEW OF SYSTEMS: ROS    Positive for: Eyes   Negative for: Constitutional, Gastrointestinal, Neurological, Skin, Genitourinary, Musculoskeletal, HENT, Endocrine, Cardiovascular, Respiratory, Psychiatric, Allergic/Imm, Heme/Lymph   Last edited by Doneen Poisson on 11/30/2018  8:05 AM. (History)       ALLERGIES Allergies  Allergen Reactions  . Asa [Aspirin] Other (See Comments)    Contraindicated due to medical condition    PAST MEDICAL HISTORY Past Medical History:  Diagnosis Date  . Anemia   . GERD (gastroesophageal reflux disease)   . Hearing loss in right ear    Unknown cause, had full work up with Specialist in [place]  . History of chicken pox    Past Surgical History:  Procedure Laterality Date  . ABDOMINAL EXPLORATION SURGERY    . ABLATION    . DENTAL SURGERY    . EXPLORATORY LAPAROTOMY     "fibrous"  . GAS INSERTION Right 08/09/2017   Procedure: INSERTION OF GAS;  Surgeon: Bernarda Caffey, MD;  Location: [facility];  Service: Ophthalmology;  Laterality: Right;  .  LASER PHOTO ABLATION Left 08/09/2017   Procedure: LASER PHOTO ABLATION;  Surgeon: Bernarda Caffey, MD;  Location: [facility];  Service: Ophthalmology;  Laterality: Left;  . SCLERAL BUCKLE WITH POSSIBLE 25 GAUGE PARS PLANA VITRECTOMY Right 08/09/2017   Procedure: SCLERAL BUCKLE WITH 25 GAUGE PARS PLANA VITRECTOMY AND ENDOLASER;  Surgeon: Bernarda Caffey, MD;  Location: [facility];  Service: Ophthalmology;  Laterality: Right;    FAMILY HISTORY Family History  Problem Relation Age of Onset  . Diabetes Mother   . Prostate cancer Father   . Hypertension Father   . Retinal detachment Father   . Lung cancer Paternal Grandfather   . Amblyopia Neg Hx   . Blindness Neg Hx   . Cataracts Neg  Hx   . Glaucoma Neg Hx   . Macular degeneration Neg Hx   . Strabismus Neg Hx   . Retinitis pigmentosa Neg Hx     SOCIAL HISTORY Social History   Tobacco Use  . Smoking status: Never Smoker  . Smokeless tobacco: Never Used  Substance Use Topics  . Alcohol use: Yes    Alcohol/week: 0.0 standard drinks    Comment: occasional social use  . Drug use: No         OPHTHALMIC EXAM:  Base Eye Exam    Visual Acuity (Snellen - Linear)      Right Left   Dist cc 20/20 -2 20/30 -2   Dist ph cc  20/25 -2   Correction: Glasses       Tonometry (Tonopen, 8:10 AM)      Right Left   Pressure 13 16       Pupils      Pupils Dark Light Shape React APD   Right PERRL 4 3 Round Brisk 0   Left PERRL 4 3 Round Brisk 0       Visual Fields      Left Right    Full Full       Extraocular Movement      Right Left    Full Full       Neuro/Psych    Oriented x3: Yes   Mood/Affect: Normal       Dilation    Both eyes: 1.0% Mydriacyl, 2.5% Phenylephrine @ 8:10 AM        Slit Lamp and Fundus Exam    External Exam      Right Left   External Normal        Slit Lamp Exam      Right Left   Lids/Lashes Mild Dermatochalasis - upper lid, mild Meibomian gland dysfunction Mild Dermatochalasis - upper lid, mild Meibomian gland dysfunction   Conjunctiva/Sclera White and quiet White and quiet   Cornea 2+ inferior Punctate epithelial erosions, Well healed cataract wound at 1030 trace diffuse Punctate epithelial erosions, trace Debris in tear film   Anterior Chamber Deep and quiet Deep and quiet   Iris Round and well dilated Round and well dilated   Lens PC IOL in good position, 3+ fibrotic PCO--ring-like with mild pigment deposition on capsule  2-3+ Nuclear sclerosis with mild brunescence, 2+ Cortical cataract   Vitreous Post vitrectomy Vitreous syneresis, Posterior vitreous detachment, condensations centrally over macula       Fundus Exam      Right Left   Disc Pink and Sharp, mildly  Tilted disc Pink and Sharp, mildly Tilted disc, mild temporal Peripapillary atrophy   C/D Ratio 0.4 0.4   Macula Attached, Flat, good foveal reflex, Epiretinal membrane,  No heme or edema Flat, good foveal reflex,  mild Retinal pigment epithelial mottling, No heme or edema   Vessels Mild attenuation Vascular attenuation, Tortuous   Periphery Retina attached, good buckle height, good laser 360 on buckle and surrounding breaks, mild fibrosis over buckle at 10:30 -- stable from prior Attached, pigmented Lattice degeneration with atrophic holes at 0430; lattice at 1200 with atrophic break; good laser in place surrounding all lesions, No new RT/RD        Refraction    Wearing Rx      Sphere Cylinder Axis   Right -1.00 +0.50 180   Left -3.50 Sphere    Type: SVL          IMAGING AND PROCEDURES  Imaging and Procedures for _0 @  OCT, Retina - OU - Both Eyes       Right Eye Quality was good. Central Foveal Thickness: 368. Progression has worsened. Findings include no SRF, epiretinal membrane, abnormal foveal contour, intraretinal fluid, myopic contour (interval development of foveal cysts and macula pucker).   Left Eye Quality was good. Central Foveal Thickness: 288. Progression has been stable. Findings include normal foveal contour, no IRF, no SRF, vitreomacular adhesion .   Notes *Images captured and stored on drive  Diagnosis / Impression:  OD: No IRF/SRF; +ERM w/ interval development of foveal cysts and macular pucker OS: NFP, No IRF/SRF, VMA-stable  Clinical management:  See below  Abbreviations: NFP - Normal foveal profile. CME - cystoid macular edema. PED - pigment epithelial detachment. IRF - intraretinal fluid. SRF - subretinal fluid. EZ - ellipsoid zone. ERM - epiretinal membrane. ORA - outer retinal atrophy. ORT - outer retinal tubulation. SRHM - subretinal hyper-reflective material                  ASSESSMENT/PLAN:    ICD-10-CM   1. RD (retinal  detachment), right  H33.21   2. Epiretinal membrane (ERM) of right eye  H35.371   3. 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 Posterior vitreous detachment of left eye  H43.812   7. Pseudophakia of right eye  Z96.1   8. PCO (posterior capsular opacification), right  H26.491   9. Combined forms of age-related cataract of left eye  H25.812     1. Rhegmatogenous retinal detachment, RIGHT eye  - bullous superotemporal mac on detachment, onset of foveal involvement Saturday, 01/30/17 by history  - detached from 1030-1200 oclock, large horseshoe tear from 1030-1100 and linear tear at 1130 within bed of lattice  - s/p SBP + PPV/EL/FAX/14% C3F8 OD, 06.10.19             - retina attached and in good position -- good buckle height and laser around breaks             - IOP okay today at 13  - cont Brimonidine OD QD -- okay to stop  2,3.  Mild ERM OD  - mild ERM with interval development of mild foveal cysts and pucker  - asymptomatic, no metamorphopsia  - no indication for surgery at this time  - monitor for now  - FU in 4-6 months  4,5. Lattice degeneration OU;  w/ atrophic breaks left eye  - OD - superotemporal lattice at site of breaks and detachment  - OS - patches of lattice at 12 and 430  with a single atrophic breaks at each site  - s/p laser retinopexy OS at time of surgery   - monitor  6. PVD / vitreous syneresis OS  - Discussed findings and prognosis  - No RT or RD on 360 peripheral exam  - Reviewed s/s of RT/RD  - Strict return precautions for any such RT/RD signs/symptoms  7,8. Pseudophakia OD  - cataract OD progressively worsened following PPV  - s/p CE/IOL by expert surgeon Dr. Herbert Deaner on 09.24.19  - beautiful surgery, doing well with new glasses  - PCO/ring-like fibrosis OD  - BCVA 20/20 OD  9. Combined form age-related cataract OS-   - The  symptoms of cataract, surgical options, and treatments and risks were discussed with patient.  - discussed diagnosis and progression  - under the expert care of Dr. Herbert Deaner  - pt wishes to hold off on CE/IOL OS for now    Ophthalmic Meds Ordered this visit:  No orders of the defined types were placed in this encounter.      Return for f/u 4-6 months ERM OD, DFE, OCT.  There are no Patient Instructions on file for this visit.   Explained the diagnoses, plan, and follow up with the patient and they expressed understanding.  Patient expressed understanding of the importance of proper follow up care.   This document serves as a record of services personally performed by Gardiner Sleeper, MD, PhD. It was created on their behalf by Ernest Mallick, OA, an ophthalmic assistant. The creation of this record is the provider's dictation and/or activities during the visit.    Electronically signed by: Ernest Mallick, OA  09.29.2020 8:50 AM    Gardiner Sleeper, M.D., Ph.D. Diseases & Surgery of the Retina and Vitreous [facility]  I have reviewed the above documentation for accuracy and completeness, and I agree with the above. Gardiner Sleeper, M.D., Ph.D. 11/30/18 8:50 AM    Abbreviations: M myopia (nearsighted); A astigmatism; H hyperopia (farsighted); P presbyopia; Mrx spectacle prescription;  CTL contact lenses; OD right eye; OS left eye; OU both eyes  XT exotropia; ET esotropia; PEK punctate epithelial keratitis; PEE punctate epithelial erosions; DES dry eye syndrome; MGD meibomian gland dysfunction; ATs artificial tears; PFAT's preservative free artificial tears; [place] nuclear sclerotic cataract; PSC posterior subcapsular cataract; ERM epi-retinal membrane; PVD posterior vitreous detachment; RD retinal detachment; DM diabetes mellitus; DR diabetic retinopathy; NPDR non-proliferative diabetic retinopathy; PDR proliferative diabetic retinopathy; CSME clinically significant  macular edema; DME diabetic macular edema; dbh dot blot hemorrhages; CWS cotton wool spot; POAG primary open angle glaucoma; C/D cup-to-disc ratio; HVF humphrey visual field; GVF goldmann visual field; OCT optical coherence tomography; IOP intraocular pressure; BRVO Branch retinal vein occlusion; CRVO central retinal vein occlusion; CRAO central retinal artery occlusion; BRAO branch retinal artery occlusion; RT retinal tear; SB scleral buckle; PPV pars plana vitrectomy; VH Vitreous hemorrhage; PRP panretinal laser photocoagulation; IVK intravitreal kenalog; VMT vitreomacular traction; MH Macular hole;  NVD neovascularization of the disc; NVE neovascularization elsewhere; AREDS age related eye disease study; ARMD age related macular degeneration; POAG primary open angle glaucoma; EBMD epithelial/anterior basement membrane dystrophy; ACIOL anterior chamber intraocular lens; IOL intraocular lens; PCIOL posterior chamber intraocular lens; Phaco/IOL phacoemulsification with intraocular lens placement; [place] photorefractive keratectomy; LASIK laser assisted in situ keratomileusis; HTN hypertension; DM diabetes mellitus; COPD chronic obstructive pulmonary disease

## 2018-11-30 ENCOUNTER — Other Ambulatory Visit: Payer: Self-pay

## 2018-11-30 ENCOUNTER — Ambulatory Visit (INDEPENDENT_AMBULATORY_CARE_PROVIDER_SITE_OTHER): Payer: Federal, State, Local not specified - PPO | Admitting: Ophthalmology

## 2018-11-30 ENCOUNTER — Encounter (INDEPENDENT_AMBULATORY_CARE_PROVIDER_SITE_OTHER): Payer: Self-pay | Admitting: Ophthalmology

## 2018-11-30 DIAGNOSIS — H35413 Lattice degeneration of retina, bilateral: Secondary | ICD-10-CM | POA: Diagnosis not present

## 2018-11-30 DIAGNOSIS — H35371 Puckering of macula, right eye: Secondary | ICD-10-CM

## 2018-11-30 DIAGNOSIS — H3581 Retinal edema: Secondary | ICD-10-CM | POA: Diagnosis not present

## 2018-11-30 DIAGNOSIS — H3321 Serous retinal detachment, right eye: Secondary | ICD-10-CM | POA: Diagnosis not present

## 2018-11-30 DIAGNOSIS — H25812 Combined forms of age-related cataract, left eye: Secondary | ICD-10-CM

## 2018-11-30 DIAGNOSIS — H43812 Vitreous degeneration, left eye: Secondary | ICD-10-CM

## 2018-11-30 DIAGNOSIS — H33332 Multiple defects of retina without detachment, left eye: Secondary | ICD-10-CM

## 2018-11-30 DIAGNOSIS — Z961 Presence of intraocular lens: Secondary | ICD-10-CM

## 2018-11-30 DIAGNOSIS — H26491 Other secondary cataract, right eye: Secondary | ICD-10-CM

## 2018-12-06 ENCOUNTER — Other Ambulatory Visit: Payer: Self-pay

## 2018-12-06 ENCOUNTER — Ambulatory Visit
Admission: RE | Admit: 2018-12-06 | Discharge: 2018-12-06 | Disposition: A | Discharge status: Home / Self Care | Payer: Federal, State, Local not specified - PPO | Source: Ambulatory Visit | Attending: Family Medicine | Admitting: Family Medicine

## 2018-12-06 DIAGNOSIS — Z1231 Encounter for screening mammogram for malignant neoplasm of breast: Secondary | ICD-10-CM | POA: Diagnosis not present

## 2018-12-06 DIAGNOSIS — Z1239 Encounter for other screening for malignant neoplasm of breast: Secondary | ICD-10-CM

## 2018-12-07 ENCOUNTER — Telehealth (INDEPENDENT_AMBULATORY_CARE_PROVIDER_SITE_OTHER): Payer: Self-pay

## 2018-12-08 DIAGNOSIS — Z6830 Body mass index (BMI) 30.0-30.9, adult: Secondary | ICD-10-CM | POA: Diagnosis not present

## 2018-12-08 DIAGNOSIS — Z01419 Encounter for gynecological examination (general) (routine) without abnormal findings: Secondary | ICD-10-CM | POA: Diagnosis not present

## 2018-12-20 DIAGNOSIS — K603 Anal fistula: Secondary | ICD-10-CM | POA: Diagnosis not present

## 2019-02-25 IMAGING — RF DG ESOPHAGUS
5 series · 14 of 21 positions shown · non-contrast
Comparison: None.

CLINICAL DATA: Bad breath.  Rule out Gil

EXAM:
ESOPHOGRAM / BARIUM SWALLOW / BARIUM TABLET STUDY
TECHNIQUE: Combined double contrast and single contrast examination performed
using effervescent crystals, thick barium liquid, and thin barium
liquid. The patient was observed with fluoroscopy swallowing a 13 mm
barium sulphate tablet.
FLUOROSCOPY TIME:  Fluoroscopy Time:  1 minutes 24 seconds
Radiation Exposure Index (if provided by the fluoroscopic device):
Number of Acquired Spot Images: 0

[Series 1: sequence · 3 of 13 frames shown (1 of 4)]
[frame 2/13]
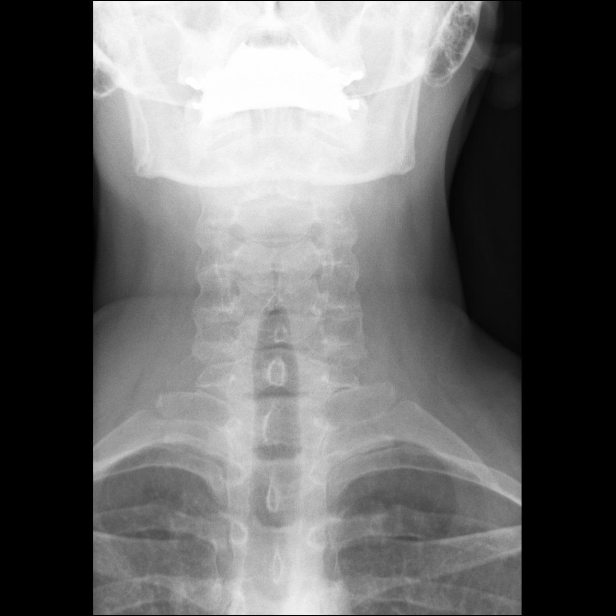
[frame 7/13]
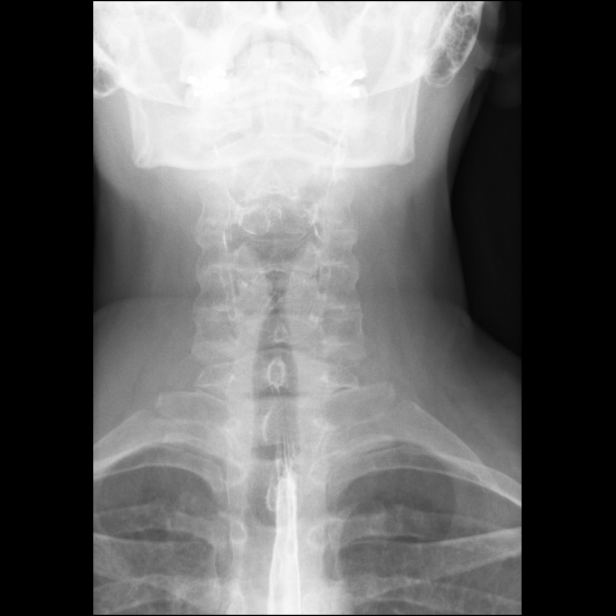
[frame 12/13]
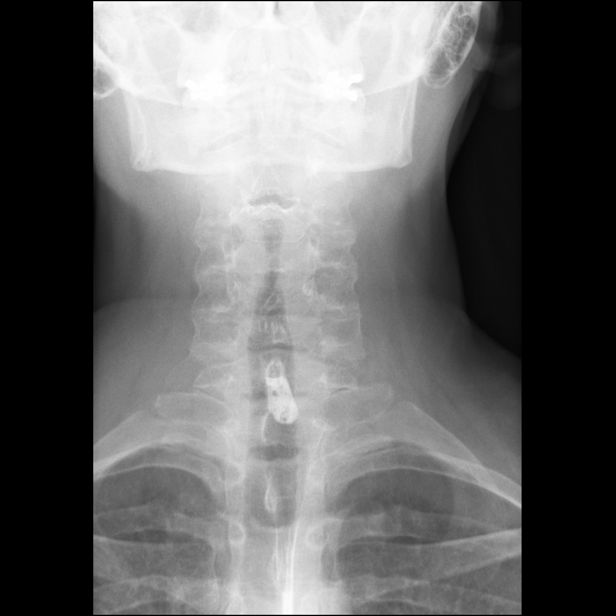

[Series 2: sequence · 2 of 16 frames shown (2 of 4)]
[frame 9/16]
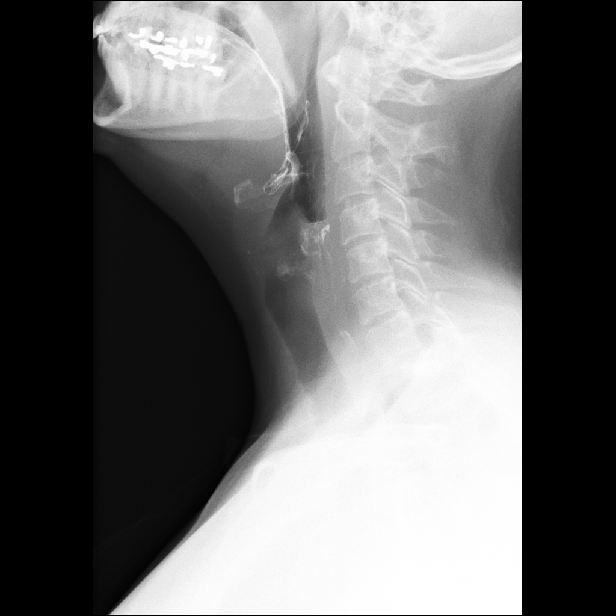
[frame 14/16]
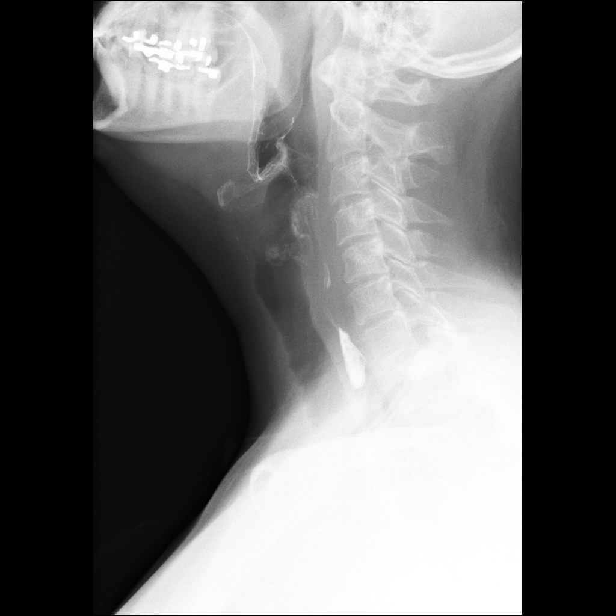

[Series 3: sequence · 3 of 30 frames shown (3 of 4)]
[frame 5/30]
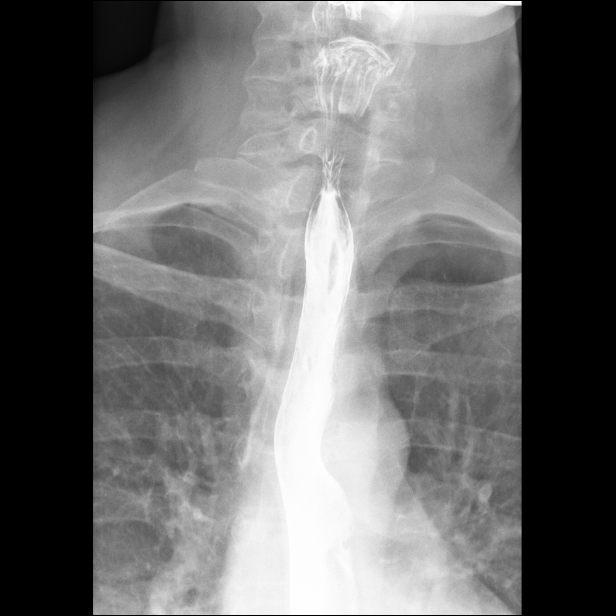
[frame 10/30]
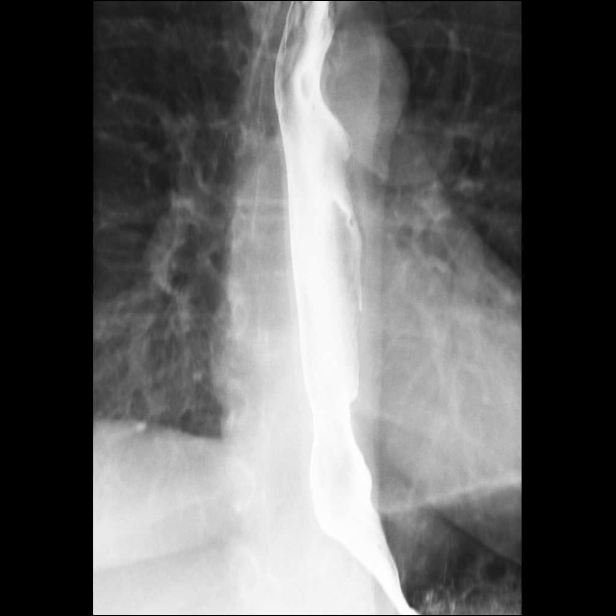
[frame 26/30]
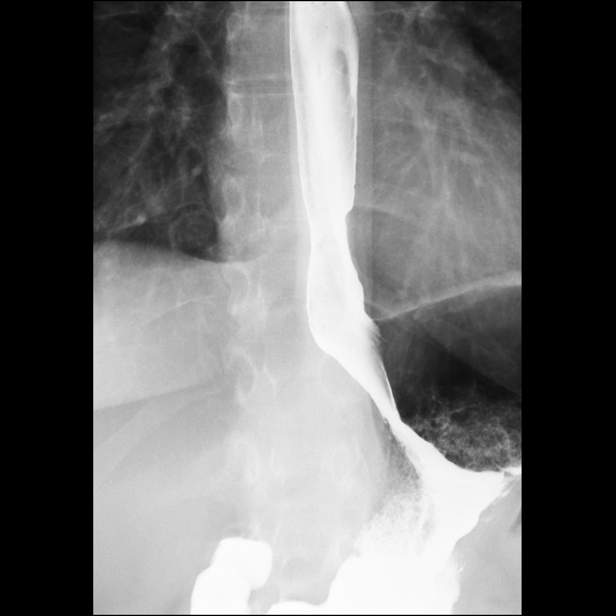

[Series 4: sequence · 3 of 25 frames shown (4 of 4)]
[frame 3/25]
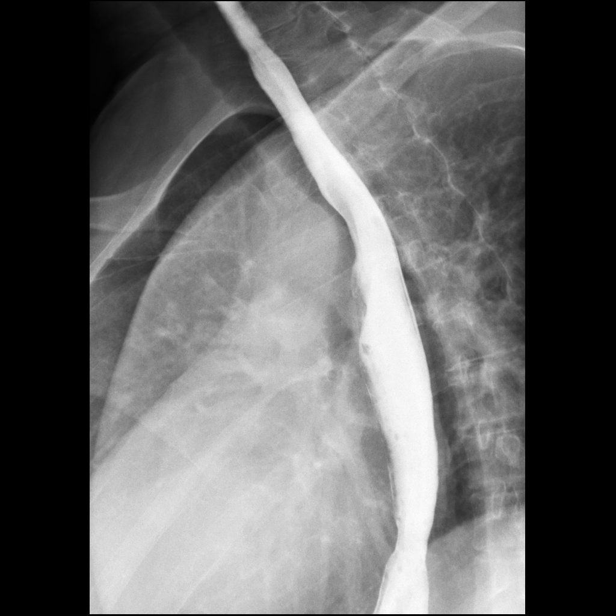
[frame 13/25]
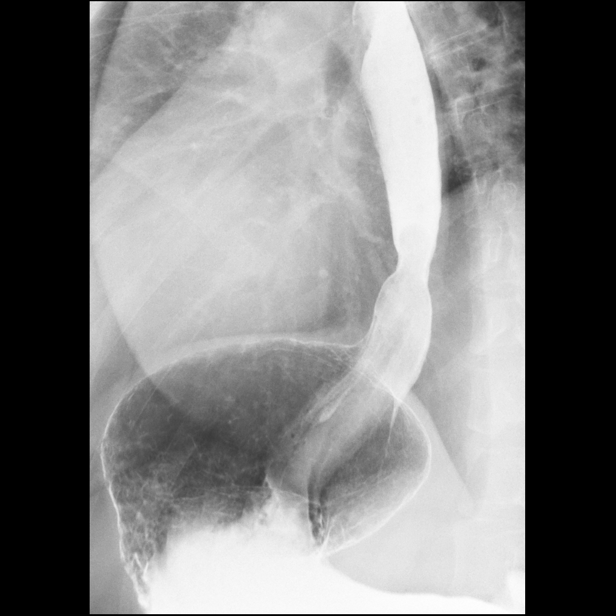
[frame 22/25]
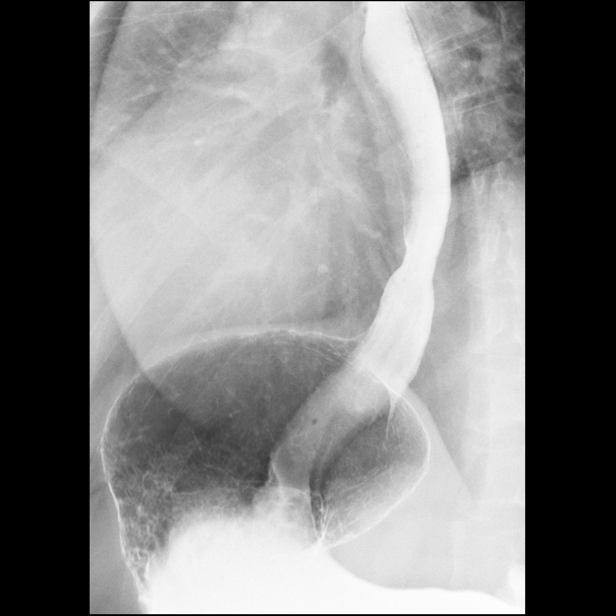

[Series 5: one shot · 3 of 5 slices shown]
[im 2/5]
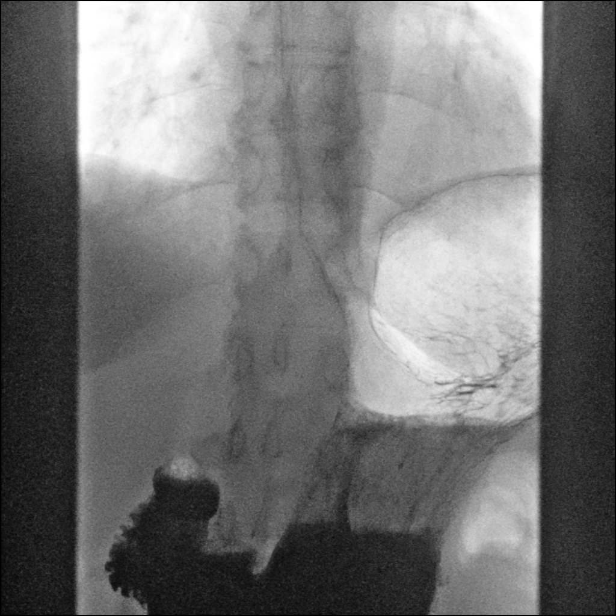
[im 3/5]
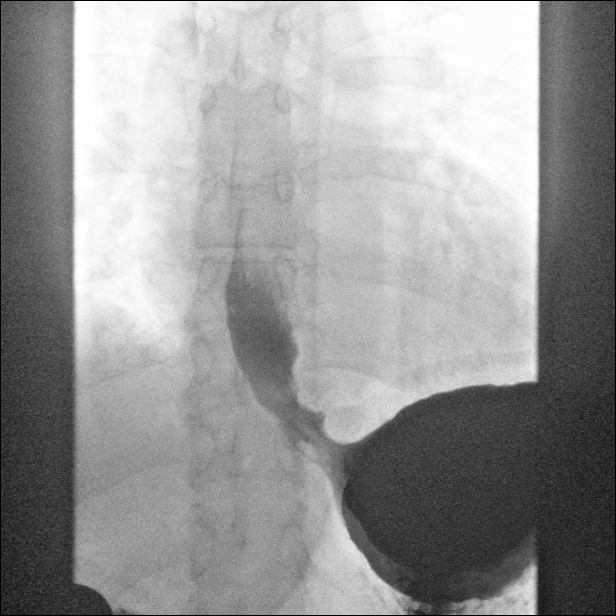
[im 5/5]
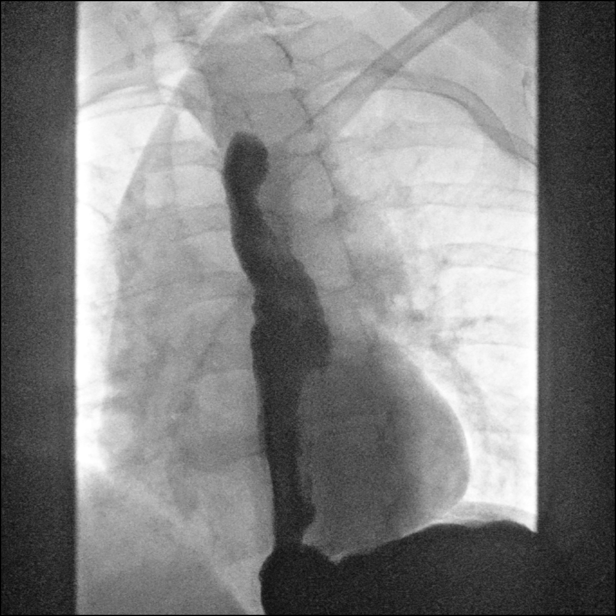

[14 of 21 positions shown; findings below may reference images not displayed]

FINDINGS: Negative for Gil diverticulum. Normal pharyngeal phase of
swallowing. No aspiration.

Mild decreased esophageal peristalsis diffusely. No stricture or
mass

Mild hiatal hernia with moderate gastroesophageal reflux

Barium tablet passed readily into the stomach without delay.
IMPRESSION: Hiatal hernia with moderate gastroesophageal reflux

Negative for Gil diverticulum

## 2019-03-24 ENCOUNTER — Ambulatory Visit (INDEPENDENT_AMBULATORY_CARE_PROVIDER_SITE_OTHER): Payer: Federal, State, Local not specified - PPO | Admitting: Family Medicine

## 2019-03-24 ENCOUNTER — Encounter: Payer: Self-pay | Admitting: Family Medicine

## 2019-03-24 VITALS — HR 90 | Temp 98.5°F | Ht 62.5 in | Wt 165.1 lb

## 2019-03-24 DIAGNOSIS — M8560 Other cyst of bone, unspecified site: Secondary | ICD-10-CM | POA: Insufficient documentation

## 2019-03-24 DIAGNOSIS — M545 Low back pain, unspecified: Secondary | ICD-10-CM

## 2019-03-24 HISTORY — DX: Other cyst of bone, unspecified site: M85.60

## 2019-03-24 MED ORDER — PREDNISONE 20 MG PO TABS: ORAL_TABLET | ORAL | 0 refills | Status: DC

## 2019-03-24 MED ORDER — CYCLOBENZAPRINE HCL 5 MG PO TABS: 5.0000 mg | ORAL_TABLET | Freq: Every day | ORAL | 0 refills | Status: DC

## 2019-03-24 NOTE — Progress Notes (Signed)
VIRTUAL VISIT VIA VIDEO  I connected with Gail Duncan on 03/24/19 at  2:00 PM EST by a video enabled telemedicine application and verified that I am speaking with the correct person using two identifiers. Location patient: Home Location provider: [facility], Office Persons participating in the virtual visit: Patient, Dr. Raoul Pitch and R.Baker, LPN  I discussed the limitations of evaluation and management by telemedicine and the availability of in person appointments. The patient expressed understanding and agreed to proceed.   SUBJECTIVE Chief Complaint  Patient presents with  . Back Pain    Pt is having back pain that feels like a stabbing pain x3 days. Denies injury. Pt has full ROM. Back pain has improved but is not better.   . Hand Pain    Pt would like referral to hand specialist L middle finger, she has a bump that feels like scar tissue. She is asking for [facility] ortho     HPI: Gail Duncan is a 54 y.o. female present for back pain of 3 days.  She reports the pain is in her left/midline lower back.  Points to her upper lumbar spine.  She reports it will feel like a stabbing pain midline.  She does not recall any injury or change in activity prior to onset.  She states is a little better today but is not completely resolved.  She denies any prior history of lower back pain or surgery.  She reports she still has full range of motion without pain.  She denies any radiation of pain down to her extremities.  ROS: See pertinent positives and negatives per HPI.  Patient Active Problem List   Diagnosis Date Noted  . Overweight (BMI 25.0-29.9) 09/30/2018  . Use of proton pump inhibitor therapy 09/30/2018  . Retinal detachment, right 08/09/2017  . Lymphadenopathy of right cervical region 08/25/2016  . Iron deficiency 12/26/2014    Social History   Tobacco Use  . Smoking status: Never Smoker  . Smokeless tobacco: Never Used  Substance Use Topics  . Alcohol use: Yes   Alcohol/week: 0.0 standard drinks    Comment: occasional social use    Current Outpatient Medications:  .  Ascorbic Acid (VITAMIN C) 1000 MG tablet, Take 1,000 mg by mouth daily., Disp: , Rfl:  .  chlorpheniramine (CHLOR-TRIMETON) 4 MG tablet, Take 4 mg by mouth 2 (two) times daily as needed for allergies., Disp: , Rfl:  .  Cholecalciferol (VITAMIN D3) 5000 units CAPS, Take 5,000 capsules by mouth 3 (three) times a week. , Disp: , Rfl:  .  Coenzyme Q10-Vitamin E (COQ10-VITAMIN E) 100-10 MG-UNIT CAPS, Take 1 capsule by mouth daily. , Disp: , Rfl:  .  pantoprazole (PROTONIX) 40 MG tablet, Take 40 mg by mouth daily., Disp: , Rfl:  .  dorzolamide-timolol (COSOPT) 22.3-6.8 MG/ML ophthalmic solution, Place 1 drop into the right eye 2 (two) times daily., Disp: , Rfl:   Allergies  Allergen Reactions  . Asa [Aspirin] Other (See Comments)    Contraindicated due to medical condition    OBJECTIVE: Pulse 90   Temp 98.5 F (36.9 C) (Oral)   Ht 5' 2.5" (1.588 m)   Wt 165 lb 2 oz (74.9 kg)   LMP 12/22/2014   SpO2 99%   BMI 29.72 kg/m  Gen: No acute distress. Nontoxic in appearance.  HENT: AT. [place].  MMM.  Eyes:Pupils Equal Round Reactive to light, Extraocular movements intact,  Conjunctiva without redness, discharge or icterus. Skin: No rashes, purpura or petechiae.  Neuro/MSK:  Normal gait. Alert. Oriented x3.  Left middle finger with mass DIP joint. Psych: Normal affect, dress and demeanor. Normal speech. Normal thought content and judgment.  ASSESSMENT AND PLAN: Gail Duncan is a 54 y.o. 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  It is tender for her.  She would like to pursue treatment.  Discussed that treatment options may include surgical removal and she is agreeable to this today.  She would like to be referred to hand surgery.  Lumbar pain Rest.  Ice/heat alternation. Prednisone taper prescribed.  (NSAIDs contraindicated) Flexeril prescribed  nightly. If symptoms remain, or worsen after treatment in 2 weeks follow-up.  No orders of the defined types were placed in this encounter.  Meds ordered this encounter  Medications  . predniSONE (DELTASONE) 20 MG tablet    Sig: 60 mg x3d, 40 mg x3d, 20 mg x2d, 10 mg x2d    Dispense:  18 tablet    Refill:  0  . cyclobenzaprine (FLEXERIL) 5 MG tablet    Sig: Take 1 tablet (5 mg total) by mouth at bedtime.    Dispense:  30 tablet    Refill:  0   Referral Orders  No referral(s) requested today     Felix Pacini, DO 03/24/2019

## 2019-04-05 DIAGNOSIS — M79645 Pain in left finger(s): Secondary | ICD-10-CM | POA: Diagnosis not present

## 2019-05-13 ENCOUNTER — Ambulatory Visit: Payer: Federal, State, Local not specified - PPO | Attending: Internal Medicine

## 2019-05-13 DIAGNOSIS — Z23 Encounter for immunization: Secondary | ICD-10-CM

## 2019-05-13 NOTE — Progress Notes (Signed)
   Covid-19 Vaccination Clinic  Name:  Gail Duncan    MRN: [id number] DOB: 04/08/1965  05/13/2019  [name] was observed post Covid-19 immunization for 15 minutes without incident. She was provided with Vaccine Information Sheet and instruction to access the V-Safe system.   [name] was instructed to call 911 with any severe reactions post vaccine: Marland Kitchen Difficulty breathing  . Swelling of face and throat  . A fast heartbeat  . A bad rash all over body  . Dizziness and weakness   Immunizations Administered    Name Date Dose VIS Date Route   Pfizer COVID-19 Vaccine 05/13/2019 10:13 AM 0.3 mL 02/10/2019 Intramuscular   Manufacturer: [employer], [employer]   Lot: [id number]   NDC: [address]-0

## 2019-05-26 DIAGNOSIS — H16142 Punctate keratitis, left eye: Secondary | ICD-10-CM | POA: Diagnosis not present

## 2019-05-29 DIAGNOSIS — H04123 Dry eye syndrome of bilateral lacrimal glands: Secondary | ICD-10-CM | POA: Diagnosis not present

## 2019-05-31 ENCOUNTER — Encounter (INDEPENDENT_AMBULATORY_CARE_PROVIDER_SITE_OTHER): Payer: Federal, State, Local not specified - PPO | Admitting: Ophthalmology

## 2019-06-06 ENCOUNTER — Ambulatory Visit: Payer: Federal, State, Local not specified - PPO | Attending: Internal Medicine

## 2019-06-06 DIAGNOSIS — Z23 Encounter for immunization: Secondary | ICD-10-CM

## 2019-06-06 NOTE — Progress Notes (Signed)
   Covid-19 Vaccination Clinic  Name:  Gail Duncan    MRN: [id number] DOB: [birth year]  06/06/2019  [name] was observed post Covid-19 immunization for 15 minutes without incident. She was provided with Vaccine Information Sheet and instruction to access the V-Safe system.   [name] was instructed to call 911 with any severe reactions post vaccine: Marland Kitchen Difficulty breathing  . Swelling of face and throat  . A fast heartbeat  . A bad rash all over body  . Dizziness and weakness   Immunizations Administered    Name Date Dose VIS Date Route   Pfizer COVID-19 Vaccine 06/06/2019  3:00 PM 0.3 mL 02/10/2019 Intramuscular   Manufacturer: [employer], [employer]   Lot: [id number]   NDC: [address]-8

## 2019-06-07 ENCOUNTER — Encounter (INDEPENDENT_AMBULATORY_CARE_PROVIDER_SITE_OTHER): Payer: Federal, State, Local not specified - PPO | Admitting: Ophthalmology

## 2019-06-13 NOTE — Progress Notes (Signed)
[facility] Note  06/14/2019     CHIEF COMPLAINT Patient presents for Retina Follow Up   HISTORY OF PRESENT ILLNESS: Gail Duncan is a 54 y.o. female who presents to the clinic today for:   HPI    Retina Follow Up    Patient presents with  Other.  In right eye.  This started years ago.  Severity is mild.  Duration of 6.5 months.  Since onset it is stable.  I, the attending physician,  performed the HPI with the patient and updated documentation appropriately.          Comments    54 y/o female pt here for 6.5 mo f/u for ERM OD.  No change in [place] OU.  Denies pain, FOL, floaters.  PFAT QID OU.       Last edited by Bernarda Caffey, MD on 06/14/2019  5:08 PM. (History)    pt states   Referring physician: Ma Hillock, DO [address] [place],   [id number]  HISTORICAL INFORMATION:   Selected notes from the MEDICAL RECORD NUMBER Referred by ED for possible RD   CURRENT MEDICATIONS: Current Outpatient Medications (Ophthalmic Drugs)  Medication Sig  . dorzolamide-timolol (COSOPT) 22.3-6.8 MG/ML ophthalmic solution Place 1 drop into the right eye 2 (two) times daily.  . prednisoLONE acetate (PRED FORTE) 1 % ophthalmic suspension Place 1 drop into the right eye 4 (four) times daily for 7 days.   No current facility-administered medications for this visit. (Ophthalmic Drugs)   Current Outpatient Medications (Other)  Medication Sig  . Ascorbic Acid (VITAMIN C) 1000 MG tablet Take 1,000 mg by mouth daily.  . chlorpheniramine (CHLOR-TRIMETON) 4 MG tablet Take 4 mg by mouth 2 (two) times daily as needed for allergies.  . Cholecalciferol (VITAMIN D3) 5000 units CAPS Take 5,000 capsules by mouth 3 (three) times a week.   . Coenzyme Q10-Vitamin E (COQ10-VITAMIN E) 100-10 MG-UNIT CAPS Take 1 capsule by mouth daily.   . cyclobenzaprine (FLEXERIL) 5 MG tablet Take 1 tablet (5 mg total) by mouth at bedtime.  . Magnesium Citrate (CITROMA PO) magnesium citrate   . pantoprazole (PROTONIX) 20 MG tablet Take 20 mg by mouth daily.  . pantoprazole (PROTONIX) 40 MG tablet Take 40 mg by mouth daily.  . predniSONE (DELTASONE) 20 MG tablet 60 mg x3d, 40 mg x3d, 20 mg x2d, 10 mg x2d   No current facility-administered medications for this visit. (Other)      REVIEW OF SYSTEMS: ROS    Positive for: Eyes   Negative for: Constitutional, Gastrointestinal, Neurological, Skin, Genitourinary, Musculoskeletal, HENT, Endocrine, Cardiovascular, Respiratory, Psychiatric, Allergic/Imm, Heme/Lymph   Last edited by Matthew Folks, COA on 06/14/2019  3:12 PM. (History)       ALLERGIES Allergies  Allergen Reactions  . Asa [Aspirin] Other (See Comments)    Contraindicated due to medical condition    PAST MEDICAL HISTORY Past Medical History:  Diagnosis Date  . Anemia   . Cataract    Combined form OS  . GERD (gastroesophageal reflux disease)   . Hearing loss in right ear    Unknown cause, had full work up with Specialist in [place]  . History of chicken pox   . Retinal detachment    Past Surgical History:  Procedure Laterality Date  . ABDOMINAL EXPLORATION SURGERY    . ABLATION    . CATARACT EXTRACTION Right 11/23/2017   Dr. Herbert Deaner  . DENTAL SURGERY    .  EXPLORATORY LAPAROTOMY     "fibrous"  . EYE SURGERY Right 11/23/2017   Cat Sx - Dr. Herbert Deaner  . GAS INSERTION Right 08/09/2017   Procedure: INSERTION OF GAS;  Surgeon: Bernarda Caffey, MD;  Location: [place];  Service: Ophthalmology;  Laterality: Right;  . LASER PHOTO ABLATION Left 08/09/2017   Procedure: LASER PHOTO ABLATION;  Surgeon: Bernarda Caffey, MD;  Location: [place];  Service: Ophthalmology;  Laterality: Left;  . RETINAL DETACHMENT SURGERY Right 08/09/2017   SBP+PPV - Dr. Bernarda Caffey  . SCLERAL BUCKLE WITH POSSIBLE 25 GAUGE PARS PLANA VITRECTOMY Right 08/09/2017   Procedure: SCLERAL BUCKLE WITH 25 GAUGE PARS PLANA VITRECTOMY AND ENDOLASER;  Surgeon: Bernarda Caffey, MD;  Location: [place];  Service:  Ophthalmology;  Laterality: Right;    FAMILY HISTORY Family History  Problem Relation Age of Onset  . Diabetes Mother   . Prostate cancer Father   . Hypertension Father   . Retinal detachment Father   . Lung cancer Paternal Grandfather   . Amblyopia Neg Hx   . Blindness Neg Hx   . Cataracts Neg Hx   . Glaucoma Neg Hx   . Macular degeneration Neg Hx   . Strabismus Neg Hx   . Retinitis pigmentosa Neg Hx     SOCIAL HISTORY Social History   Tobacco Use  . Smoking status: Never Smoker  . Smokeless tobacco: Never Used  Substance Use Topics  . Alcohol use: Yes    Alcohol/week: 0.0 standard drinks    Comment: occasional social use  . Drug use: No         OPHTHALMIC EXAM:  Base Eye Exam    Visual Acuity (Snellen - Linear)      Right Left   Dist cc 20/15 -2 20/30 -2   Dist ph cc  N(   Correction: Glasses       Tonometry (Tonopen, 3:15 PM)      Right Left   Pressure 14 15       Pupils      Dark Light Shape React APD   Right 4 3 Round Brisk None   Left 4 3 Round Brisk None       Visual Fields (Counting fingers)      Left Right    Full Full       Extraocular Movement      Right Left    Full, Ortho Full, Ortho       Neuro/Psych    Oriented x3: Yes   Mood/Affect: Normal       Dilation    Both eyes: 1.0% Mydriacyl, 2.5% Phenylephrine @ 3:15 PM        Slit Lamp and Fundus Exam    External Exam      Right Left   External Normal        Slit Lamp Exam      Right Left   Lids/Lashes Mild Dermatochalasis - upper lid, mild Meibomian gland dysfunction Mild Dermatochalasis - upper lid, mild Meibomian gland dysfunction   Conjunctiva/Sclera White and quiet White and quiet   Cornea 1+ inferior Punctate epithelial erosions, Well healed cataract wound at 1030 trace diffuse Punctate epithelial erosions, trace Debris in tear film   Anterior Chamber Deep and quiet Deep and quiet   Iris Round and well dilated Round and well dilated   Lens PC IOL in good  position, 3+ PCO w/ fibrotic ring and mild pigment deposition 2-3+ Nuclear sclerosis with mild brunescence, 2-3+ Cortical cataract  Vitreous Post vitrectomy; mild pigment Vitreous syneresis, Posterior vitreous detachment, condensations centrally over macula       Fundus Exam      Right Left   Disc Pink and Sharp, mildly Tilted disc Pink and Sharp, mildly Tilted disc, mild temporal Peripapillary atrophy   C/D Ratio 0.4 0.4   Macula Attached, Flat, good foveal reflex, Epiretinal membrane, No heme or edema Flat, good foveal reflex,  mild Retinal pigment epithelial mottling, No heme or edema   Vessels Mild attenuation Vascular attenuation, Tortuous   Periphery Retina attached, good buckle height, good laser 360 on buckle and surrounding breaks, mild fibrosis over buckle at 10:30 -- stable from prior Attached, pigmented Lattice degeneration with atrophic holes at 0430; lattice at 1200 with atrophic break; good laser in place surrounding all lesions, No new RT/RD        Refraction    Manifest Refraction      Sphere Cylinder Axis Dist VA   Right -1.00 +0.50 180 20/15-2   Left -3.25 Sphere  20/30-2          IMAGING AND PROCEDURES  Imaging and Procedures for _0 @  OCT, Retina - OU - Both Eyes       Right Eye Quality was good. Central Foveal Thickness: 368. Progression has improved. Findings include no SRF, epiretinal membrane, abnormal foveal contour, intraretinal fluid, myopic contour (interval improvement in foveal contour and central cystic changes - persistent ERM w/early pucker.).   Left Eye Quality was good. Central Foveal Thickness: 286. Progression has been stable. Findings include normal foveal contour, no IRF, no SRF, vitreomacular adhesion .   Notes *Images captured and stored on drive  Diagnosis / Impression:  OD: Interval improvement in foveal contour and central cystic changes - persistent ERM w/ early pucker. OS: NFP, No IRF/SRF, VMA-stable  Clinical management:   See below  Abbreviations: NFP - Normal foveal profile. CME - cystoid macular edema. PED - pigment epithelial detachment. IRF - intraretinal fluid. SRF - subretinal fluid. EZ - ellipsoid zone. ERM - epiretinal membrane. ORA - outer retinal atrophy. ORT - outer retinal tubulation. SRHM - subretinal hyper-reflective material         Yag Capsulotomy - OD - Right Eye       Procedure note: YAG Capsulotomy, RIGHT Eye  Informed consent obtained. Pre-op dilating drops (1% Topicamide and 2.5% Phenylephrine), brimonidine and topical anesthesia given. Power: 7.2 mJ Shots: 32 Posterior capsulotomy in cruciate formation performed without difficulty. Patient tolerated procedure well. No complications. Rx pred forte 4 times a day for 7 days, then stop. Pt received written and verbal post laser education. Recheck in 2-3 weeks w/ dilated exam.                 ASSESSMENT/PLAN:    ICD-10-CM   1. RD (retinal detachment), right  H33.21   2. Epiretinal membrane (ERM) of right eye  H35.371   3. 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 Pseudophakia of right eye  Z96.1   7. PCO (posterior capsular opacification), right  H26.491 Yag Capsulotomy - OD - Right Eye  8. Combined forms of age-related cataract of left eye  H25.812   9. Posterior vitreous detachment of both eyes  H43.813     1. Rhegmatogenous retinal detachment, RIGHT eye  - bullous superotemporal mac on detachment, onset of  foveal involvement Saturday, 01/30/17 by history  - detached from 1030-1200 oclock, large horseshoe tear from 1030-1100 and linear tear at 1130 within bed of lattice  - s/p SBP + PPV/EL/FAX/14% C3F8 OD, 06.10.19             - retina attached and in good position -- good buckle height and laser around breaks             - IOP okay today at 14  2,3.  Mild ERM OD  - persistent ERM  w/early pucker  - Interval improvement in foveal contour and central cystic changes   - asymptomatic, no metamorphopsia  - no indication for surgery at this time  - monitor for now  - FU in 4-6 months  4,5. Lattice degeneration OU;  w/ atrophic breaks left eye  - OD - superotemporal lattice at site of breaks and detachment  - OS - patches of lattice at 12 and 430 with a single atrophic breaks at each site  - s/p laser retinopexy OS at time of surgery   - monitor  6. PVD / vitreous syneresis OS  - Discussed findings and prognosis  - No RT or RD on 360 peripheral exam  - Reviewed s/s of RT/RD              - Strict return precautions for any such RT/RD signs/symptoms    7,8. Pseudophakia OD  - cataract OD progressively worsened following PPV  - s/p CE/IOL OD by expert surgeon Dr. Herbert Deaner on 09.24.19  - beautiful surgery, doing well with new glasses  - BCVA 20/15-2 OD  - PCO/ring-like fibrosis OD             - Recommend yag cap OD today, 4.14.21  - RBA of procedure discussed, questions answered  - informed consent obtained and signed  - see procedure note             - Begin PF QID OD x 1 wk             - F/u in 2-3 wks for POV  9. Combined form age-related cataract OS-   - The symptoms of cataract, surgical options, and treatments and risks were discussed with patient.  - discussed diagnosis and progression  - under the expert care of Dr. Herbert Deaner  - pt wishes to hold off on CE/IOL OS for now    Ophthalmic Meds Ordered this visit:  Meds ordered this encounter  Medications  . prednisoLONE acetate (PRED FORTE) 1 % ophthalmic suspension    Sig: Place 1 drop into the right eye 4 (four) times daily for 7 days.    Dispense:  10 mL    Refill:  0       Return in about 2 weeks (around 06/28/2019) for 2-3 wk f/u s/p yag caps OD.  There are no Patient Instructions on file for this visit.   Explained the diagnoses, plan, and follow up with the patient and they expressed  understanding.  Patient expressed understanding of the importance of proper follow up care.   Gardiner Sleeper, M.D., Ph.D. Diseases & Surgery of the Retina and [place] 06/14/2019   I have reviewed the above documentation for accuracy and completeness, and I agree with the above. Gardiner Sleeper, M.D., Ph.D. 06/14/19 10:56 PM   Abbreviations: M myopia (nearsighted); A astigmatism; H hyperopia (farsighted); P presbyopia; Mrx spectacle prescription;  CTL contact lenses; OD right eye; OS  left eye; OU both eyes  XT exotropia; ET esotropia; PEK punctate epithelial keratitis; PEE punctate epithelial erosions; DES dry eye syndrome; MGD meibomian gland dysfunction; ATs artificial tears; PFAT's preservative free artificial tears; [place] nuclear sclerotic cataract; PSC posterior subcapsular cataract; ERM epi-retinal membrane; PVD posterior vitreous detachment; RD retinal detachment; DM diabetes mellitus; DR diabetic retinopathy; NPDR non-proliferative diabetic retinopathy; PDR proliferative diabetic retinopathy; CSME clinically significant macular edema; DME diabetic macular edema; dbh dot blot hemorrhages; CWS cotton wool spot; POAG primary open angle glaucoma; C/D cup-to-disc ratio; HVF humphrey visual field; GVF goldmann visual field; OCT optical coherence tomography; IOP intraocular pressure; BRVO Branch retinal vein occlusion; CRVO central retinal vein occlusion; CRAO central retinal artery occlusion; BRAO branch retinal artery occlusion; RT retinal tear; SB scleral buckle; PPV pars plana vitrectomy; VH Vitreous hemorrhage; PRP panretinal laser photocoagulation; IVK intravitreal kenalog; VMT vitreomacular traction; MH Macular hole;  NVD neovascularization of the disc; NVE neovascularization elsewhere; AREDS age related eye disease study; ARMD age related macular degeneration; POAG primary open angle glaucoma; EBMD epithelial/anterior basement membrane dystrophy; ACIOL anterior  chamber intraocular lens; IOL intraocular lens; PCIOL posterior chamber intraocular lens; Phaco/IOL phacoemulsification with intraocular lens placement; [place] photorefractive keratectomy; LASIK laser assisted in situ keratomileusis; HTN hypertension; DM diabetes mellitus; COPD chronic obstructive pulmonary disease

## 2019-06-14 ENCOUNTER — Encounter (INDEPENDENT_AMBULATORY_CARE_PROVIDER_SITE_OTHER): Payer: Self-pay | Admitting: Ophthalmology

## 2019-06-14 ENCOUNTER — Ambulatory Visit (INDEPENDENT_AMBULATORY_CARE_PROVIDER_SITE_OTHER): Payer: Federal, State, Local not specified - PPO | Admitting: Ophthalmology

## 2019-06-14 ENCOUNTER — Other Ambulatory Visit: Payer: Self-pay

## 2019-06-14 DIAGNOSIS — H35371 Puckering of macula, right eye: Secondary | ICD-10-CM

## 2019-06-14 DIAGNOSIS — H35413 Lattice degeneration of retina, bilateral: Secondary | ICD-10-CM

## 2019-06-14 DIAGNOSIS — H26491 Other secondary cataract, right eye: Secondary | ICD-10-CM

## 2019-06-14 DIAGNOSIS — H43813 Vitreous degeneration, bilateral: Secondary | ICD-10-CM

## 2019-06-14 DIAGNOSIS — Z961 Presence of intraocular lens: Secondary | ICD-10-CM

## 2019-06-14 DIAGNOSIS — H3581 Retinal edema: Secondary | ICD-10-CM

## 2019-06-14 DIAGNOSIS — H25812 Combined forms of age-related cataract, left eye: Secondary | ICD-10-CM

## 2019-06-14 DIAGNOSIS — H33332 Multiple defects of retina without detachment, left eye: Secondary | ICD-10-CM

## 2019-06-14 DIAGNOSIS — H3321 Serous retinal detachment, right eye: Secondary | ICD-10-CM | POA: Diagnosis not present

## 2019-06-14 HISTORY — PX: YAG LASER APPLICATION: SHX6189

## 2019-06-14 MED ORDER — PREDNISOLONE ACETATE 1 % OP SUSP: 1.0000 [drp] | Freq: Four times a day (QID) | OPHTHALMIC | 0 refills | Status: AC

## 2019-06-27 NOTE — Progress Notes (Signed)
[facility] Note  06/28/2019     CHIEF COMPLAINT Patient presents for Post-op Follow-up   HISTORY OF PRESENT ILLNESS: Gail Duncan is a 54 y.o. female who presents to the clinic today for:   HPI    Post-op Follow-up    In right eye.  Vision is improved.  I, the attending physician,  performed the HPI with the patient and updated documentation appropriately.          Comments    Patient states her vision got better after the yag OD but feels like it is now regressing and feels like she needs a change in Rx.  Patient denies eye pain.       Last edited by Bernarda Caffey, MD on 06/28/2019  4:14 PM. (History)    pt states she feels like her glasses rx has changed since having the yag procedure, she states the cloudiness she was seeing is gone and everything is full color again  Referring physician: Howard Pouch A, DO [id number] [place],  [place] [address]  HISTORICAL INFORMATION:   Selected notes from the MEDICAL RECORD NUMBER Referred by ED for possible RD   CURRENT MEDICATIONS: Current Outpatient Medications (Ophthalmic Drugs)  Medication Sig  . dorzolamide-timolol (COSOPT) 22.3-6.8 MG/ML ophthalmic solution Place 1 drop into the right eye 2 (two) times daily.   No current facility-administered medications for this visit. (Ophthalmic Drugs)   Current Outpatient Medications (Other)  Medication Sig  . Ascorbic Acid (VITAMIN C) 1000 MG tablet Take 1,000 mg by mouth daily.  . chlorpheniramine (CHLOR-TRIMETON) 4 MG tablet Take 4 mg by mouth 2 (two) times daily as needed for allergies.  . Cholecalciferol (VITAMIN D3) 5000 units CAPS Take 5,000 capsules by mouth 3 (three) times a week.   . Coenzyme Q10-Vitamin E (COQ10-VITAMIN E) 100-10 MG-UNIT CAPS Take 1 capsule by mouth daily.   . cyclobenzaprine (FLEXERIL) 5 MG tablet Take 1 tablet (5 mg total) by mouth at bedtime.  . Magnesium Citrate (CITROMA PO) magnesium citrate  . pantoprazole (PROTONIX) 20  MG tablet Take 20 mg by mouth daily.  . pantoprazole (PROTONIX) 40 MG tablet Take 40 mg by mouth daily.  . predniSONE (DELTASONE) 20 MG tablet 60 mg x3d, 40 mg x3d, 20 mg x2d, 10 mg x2d   No current facility-administered medications for this visit. (Other)      REVIEW OF SYSTEMS: ROS    Positive for: Eyes   Negative for: Constitutional, Gastrointestinal, Neurological, Skin, Genitourinary, Musculoskeletal, HENT, Endocrine, Cardiovascular, Respiratory, Psychiatric, Allergic/Imm, Heme/Lymph   Last edited by Doneen Poisson on 06/28/2019  2:53 PM. (History)       ALLERGIES Allergies  Allergen Reactions  . Asa [Aspirin] Other (See Comments)    Contraindicated due to medical condition    PAST MEDICAL HISTORY Past Medical History:  Diagnosis Date  . Anemia   . Cataract    Combined form OS  . GERD (gastroesophageal reflux disease)   . Hearing loss in right ear    Unknown cause, had full work up with Specialist in [place]  . History of chicken pox   . Retinal detachment    Past Surgical History:  Procedure Laterality Date  . ABDOMINAL EXPLORATION SURGERY    . ABLATION    . CATARACT EXTRACTION Right 11/23/2017   Dr. Herbert Deaner  . DENTAL SURGERY    . EXPLORATORY LAPAROTOMY     "fibrous"  . EYE SURGERY Right 11/23/2017   Cat Sx -  Dr. Herbert Deaner  . GAS INSERTION Right 08/09/2017   Procedure: INSERTION OF GAS;  Surgeon: Bernarda Caffey, MD;  Location: [facility];  Service: Ophthalmology;  Laterality: Right;  . LASER PHOTO ABLATION Left 08/09/2017   Procedure: LASER PHOTO ABLATION;  Surgeon: Bernarda Caffey, MD;  Location: [facility];  Service: Ophthalmology;  Laterality: Left;  . RETINAL DETACHMENT SURGERY Right 08/09/2017   SBP+PPV - Dr. Bernarda Caffey  . SCLERAL BUCKLE WITH POSSIBLE 25 GAUGE PARS PLANA VITRECTOMY Right 08/09/2017   Procedure: SCLERAL BUCKLE WITH 25 GAUGE PARS PLANA VITRECTOMY AND ENDOLASER;  Surgeon: Bernarda Caffey, MD;  Location: [facility];  Service: Ophthalmology;  Laterality: Right;     FAMILY HISTORY Family History  Problem Relation Age of Onset  . Diabetes Mother   . Prostate cancer Father   . Hypertension Father   . Retinal detachment Father   . Lung cancer Paternal Grandfather   . Amblyopia Neg Hx   . Blindness Neg Hx   . Cataracts Neg Hx   . Glaucoma Neg Hx   . Macular degeneration Neg Hx   . Strabismus Neg Hx   . Retinitis pigmentosa Neg Hx     SOCIAL HISTORY Social History   Tobacco Use  . Smoking status: Never Smoker  . Smokeless tobacco: Never Used  Substance Use Topics  . Alcohol use: Yes    Alcohol/week: 0.0 standard drinks    Comment: occasional social use  . Drug use: No         OPHTHALMIC EXAM:  Base Eye Exam    Visual Acuity (Snellen - Linear)      Right Left   Dist cc 20/20 -1 20/30 -1   Dist ph cc  20/25 +1   Correction: Glasses       Tonometry (Tonopen, 2:59 PM)      Right Left   Pressure 13 13       Pupils      Dark Light Shape React APD   Right 4 3 Round Brisk 0   Left 4 3 Round Brisk 0       Visual Fields      Left Right    Full Full       Extraocular Movement      Right Left    Full Full       Neuro/Psych    Oriented x3: Yes   Mood/Affect: Normal       Dilation    Both eyes: 1.0% Mydriacyl, 2.5% Phenylephrine @ 2:59 PM        Slit Lamp and Fundus Exam    External Exam      Right Left   External Normal        Slit Lamp Exam      Right Left   Lids/Lashes Mild Dermatochalasis - upper lid, mild Meibomian gland dysfunction Mild Dermatochalasis - upper lid, mild Meibomian gland dysfunction   Conjunctiva/Sclera White and quiet White and quiet   Cornea 1+ inferior Punctate epithelial erosions, Well healed cataract wound at 1030 trace diffuse Punctate epithelial erosions, trace Debris in tear film   Anterior Chamber Deep and quiet Deep and quiet   Iris Round and well dilated Round and well dilated   Lens PC IOL in good position; open PC 2-3+ Nuclear sclerosis with mild brunescence, 2-3+  Cortical cataract   Vitreous Post vitrectomy; mild pigment Vitreous syneresis, Posterior vitreous detachment, condensations centrally over macula       Fundus Exam      Right  Left   Disc Pink and Sharp, mildly Tilted disc Pink and Sharp, mildly Tilted disc, mild temporal Peripapillary atrophy   C/D Ratio 0.4 0.4   Macula Attached, Flat, blunted foveal reflex, Epiretinal membrane with striae, No heme or edema Flat, good foveal reflex,  mild Retinal pigment epithelial mottling, No heme or edema   Vessels Mild attenuation Vascular attenuation, Tortuous   Periphery Retina attached, good buckle height, good laser 360 on buckle and surrounding breaks, mild fibrosis over buckle at 10:30 -- stable from prior Attached, pigmented Lattice degeneration with atrophic holes at 0430; lattice at 1200 with atrophic break; good laser in place surrounding all lesions, No new RT/RD        Refraction    Wearing Rx      Sphere Cylinder Axis   Right -1.00 +0.50 180   Left -3.50 Sphere    Type: SVL          IMAGING AND PROCEDURES  Imaging and Procedures for _0 @  OCT, Retina - OU - Both Eyes       Right Eye Quality was good. Central Foveal Thickness: 380. Progression has been stable. Findings include no SRF, epiretinal membrane, abnormal foveal contour, intraretinal fluid, myopic contour (interval improvement in foveal contour and central cystic changes - persistent ERM w/early pucker.).   Left Eye Quality was good. Central Foveal Thickness: 289. Progression has been stable. Findings include normal foveal contour, no IRF, no SRF, vitreomacular adhesion .   Notes *Images captured and stored on drive  Diagnosis / Impression:  OD: Interval improvement in foveal contour and central cystic changes - persistent ERM w/ early pucker. OS: NFP, No IRF/SRF, VMA-stable  Clinical management:  See below  Abbreviations: NFP - Normal foveal profile. CME - cystoid macular edema. PED - pigment epithelial  detachment. IRF - intraretinal fluid. SRF - subretinal fluid. EZ - ellipsoid zone. ERM - epiretinal membrane. ORA - outer retinal atrophy. ORT - outer retinal tubulation. SRHM - subretinal hyper-reflective material                  ASSESSMENT/PLAN:    ICD-10-CM   1. RD (retinal detachment), right  H33.21   2. Epiretinal membrane (ERM) of right eye  H35.371   3. 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 Posterior vitreous detachment of left eye  H43.812   7. Pseudophakia of right eye  Z96.1   8. PCO (posterior capsular opacification), right  H26.491   9. Combined forms of age-related cataract of left eye  H25.812     1. Rhegmatogenous retinal detachment, RIGHT eye  - bullous superotemporal mac on detachment, onset of foveal involvement Saturday, 01/30/17 by history  - detached from 1030-1200 oclock, large horseshoe tear from 1030-1100 and linear tear at 1130 within bed of lattice  - s/p SBP + PPV/EL/FAX/14% C3F8 OD, 06.10.19             - retina attached and in good position -- good buckle height and laser around breaks             - IOP okay today at 13  2,3.  Mild ERM OD  - persistent ERM w/early pucker  - Interval improvement in foveal contour and central cystic changes   - asymptomatic, no metamorphopsia  - no indication for surgery at this time  -  monitor for now  - FU in 6 months  4,5. Lattice degeneration OU;  w/ atrophic breaks left eye  - OD - superotemporal lattice at site of breaks and detachment  - OS - patches of lattice at 12 and 430 with a single atrophic breaks at each site  - s/p laser retinopexy OS at time of surgery   - monitor  6. PVD / vitreous syneresis OS  - Discussed findings and prognosis  - No RT or RD on 360 peripheral exam  - Reviewed s/s of RT/RD              - Strict return precautions for any such RT/RD  signs/symptoms   7,8. Pseudophakia OD  - cataract OD progressively worsened following PPV  - s/p CE/IOL OD by expert surgeon Dr. Herbert Deaner on 09.24.19  - beautiful surgery, doing well with new glasses  - BCVA 20/20 OD             - s/p yag cap OD (4.14.21) -- excellent opening             - completed PF QID OD x 1 wk  9. Combined form age-related cataract OS-   - The symptoms of cataract, surgical options, and treatments and risks were discussed with patient.  - discussed diagnosis and progression  - under the expert care of Dr. Herbert Deaner  - pt wishes to hold off on CE/IOL OS for now    Ophthalmic Meds Ordered this visit:  No orders of the defined types were placed in this encounter.      Return in about 6 months (around 12/28/2019) for f/u ERM OD, DFE, OCT.  There are no Patient Instructions on file for this visit.   Explained the diagnoses, plan, and follow up with the patient and they expressed understanding.  Patient expressed understanding of the importance of proper follow up care.   This document serves as a record of services personally performed by Gardiner Sleeper, MD, PhD. It was created on their behalf by Ernest Mallick, OA, an ophthalmic assistant. The creation of this record is the provider's dictation and/or activities during the visit.    Electronically signed by: Ernest Mallick, OA 04.28.2021 11:54 PM   Gardiner Sleeper, M.D., Ph.D. Diseases & Surgery of the Retina and Vitreous [facility]  I have reviewed the above documentation for accuracy and completeness, and I agree with the above. Gardiner Sleeper, M.D., Ph.D. 06/29/19 11:54 PM   Abbreviations: M myopia (nearsighted); A astigmatism; H hyperopia (farsighted); P presbyopia; Mrx spectacle prescription;  CTL contact lenses; OD right eye; OS left eye; OU both eyes  XT exotropia; ET esotropia; PEK punctate epithelial keratitis; PEE punctate epithelial erosions; DES dry eye syndrome; MGD  meibomian gland dysfunction; ATs artificial tears; PFAT's preservative free artificial tears; [place] nuclear sclerotic cataract; PSC posterior subcapsular cataract; ERM epi-retinal membrane; PVD posterior vitreous detachment; RD retinal detachment; DM diabetes mellitus; DR diabetic retinopathy; NPDR non-proliferative diabetic retinopathy; PDR proliferative diabetic retinopathy; CSME clinically significant macular edema; DME diabetic macular edema; dbh dot blot hemorrhages; CWS cotton wool spot; POAG primary open angle glaucoma; C/D cup-to-disc ratio; HVF humphrey visual field; GVF goldmann visual field; OCT optical coherence tomography; IOP intraocular pressure; BRVO Branch retinal vein occlusion; CRVO central retinal vein occlusion; CRAO central retinal artery occlusion; BRAO branch retinal artery occlusion; RT retinal tear; SB scleral buckle; PPV pars plana vitrectomy; VH Vitreous hemorrhage; PRP panretinal laser photocoagulation; IVK intravitreal kenalog; VMT  vitreomacular traction; MH Macular hole;  NVD neovascularization of the disc; NVE neovascularization elsewhere; AREDS age related eye disease study; ARMD age related macular degeneration; POAG primary open angle glaucoma; EBMD epithelial/anterior basement membrane dystrophy; ACIOL anterior chamber intraocular lens; IOL intraocular lens; PCIOL posterior chamber intraocular lens; Phaco/IOL phacoemulsification with intraocular lens placement; [place] photorefractive keratectomy; LASIK laser assisted in situ keratomileusis; HTN hypertension; DM diabetes mellitus; COPD chronic obstructive pulmonary disease

## 2019-06-28 ENCOUNTER — Encounter (INDEPENDENT_AMBULATORY_CARE_PROVIDER_SITE_OTHER): Payer: Self-pay | Admitting: Ophthalmology

## 2019-06-28 ENCOUNTER — Ambulatory Visit (INDEPENDENT_AMBULATORY_CARE_PROVIDER_SITE_OTHER): Payer: Federal, State, Local not specified - PPO | Admitting: Ophthalmology

## 2019-06-28 DIAGNOSIS — H43812 Vitreous degeneration, left eye: Secondary | ICD-10-CM

## 2019-06-28 DIAGNOSIS — H3581 Retinal edema: Secondary | ICD-10-CM | POA: Diagnosis not present

## 2019-06-28 DIAGNOSIS — H33332 Multiple defects of retina without detachment, left eye: Secondary | ICD-10-CM

## 2019-06-28 DIAGNOSIS — Z961 Presence of intraocular lens: Secondary | ICD-10-CM

## 2019-06-28 DIAGNOSIS — H25812 Combined forms of age-related cataract, left eye: Secondary | ICD-10-CM

## 2019-06-28 DIAGNOSIS — H26491 Other secondary cataract, right eye: Secondary | ICD-10-CM

## 2019-06-28 DIAGNOSIS — H35371 Puckering of macula, right eye: Secondary | ICD-10-CM

## 2019-06-28 DIAGNOSIS — H35413 Lattice degeneration of retina, bilateral: Secondary | ICD-10-CM

## 2019-06-28 DIAGNOSIS — H3321 Serous retinal detachment, right eye: Secondary | ICD-10-CM

## 2019-10-21 IMAGING — CR DG CHEST 2V
2 series · 2 of 2 positions shown · non-contrast
Comparison: No prior.

CLINICAL DATA: Cough and congestion.

EXAM:
CHEST - 2 VIEW

[w chest pa]
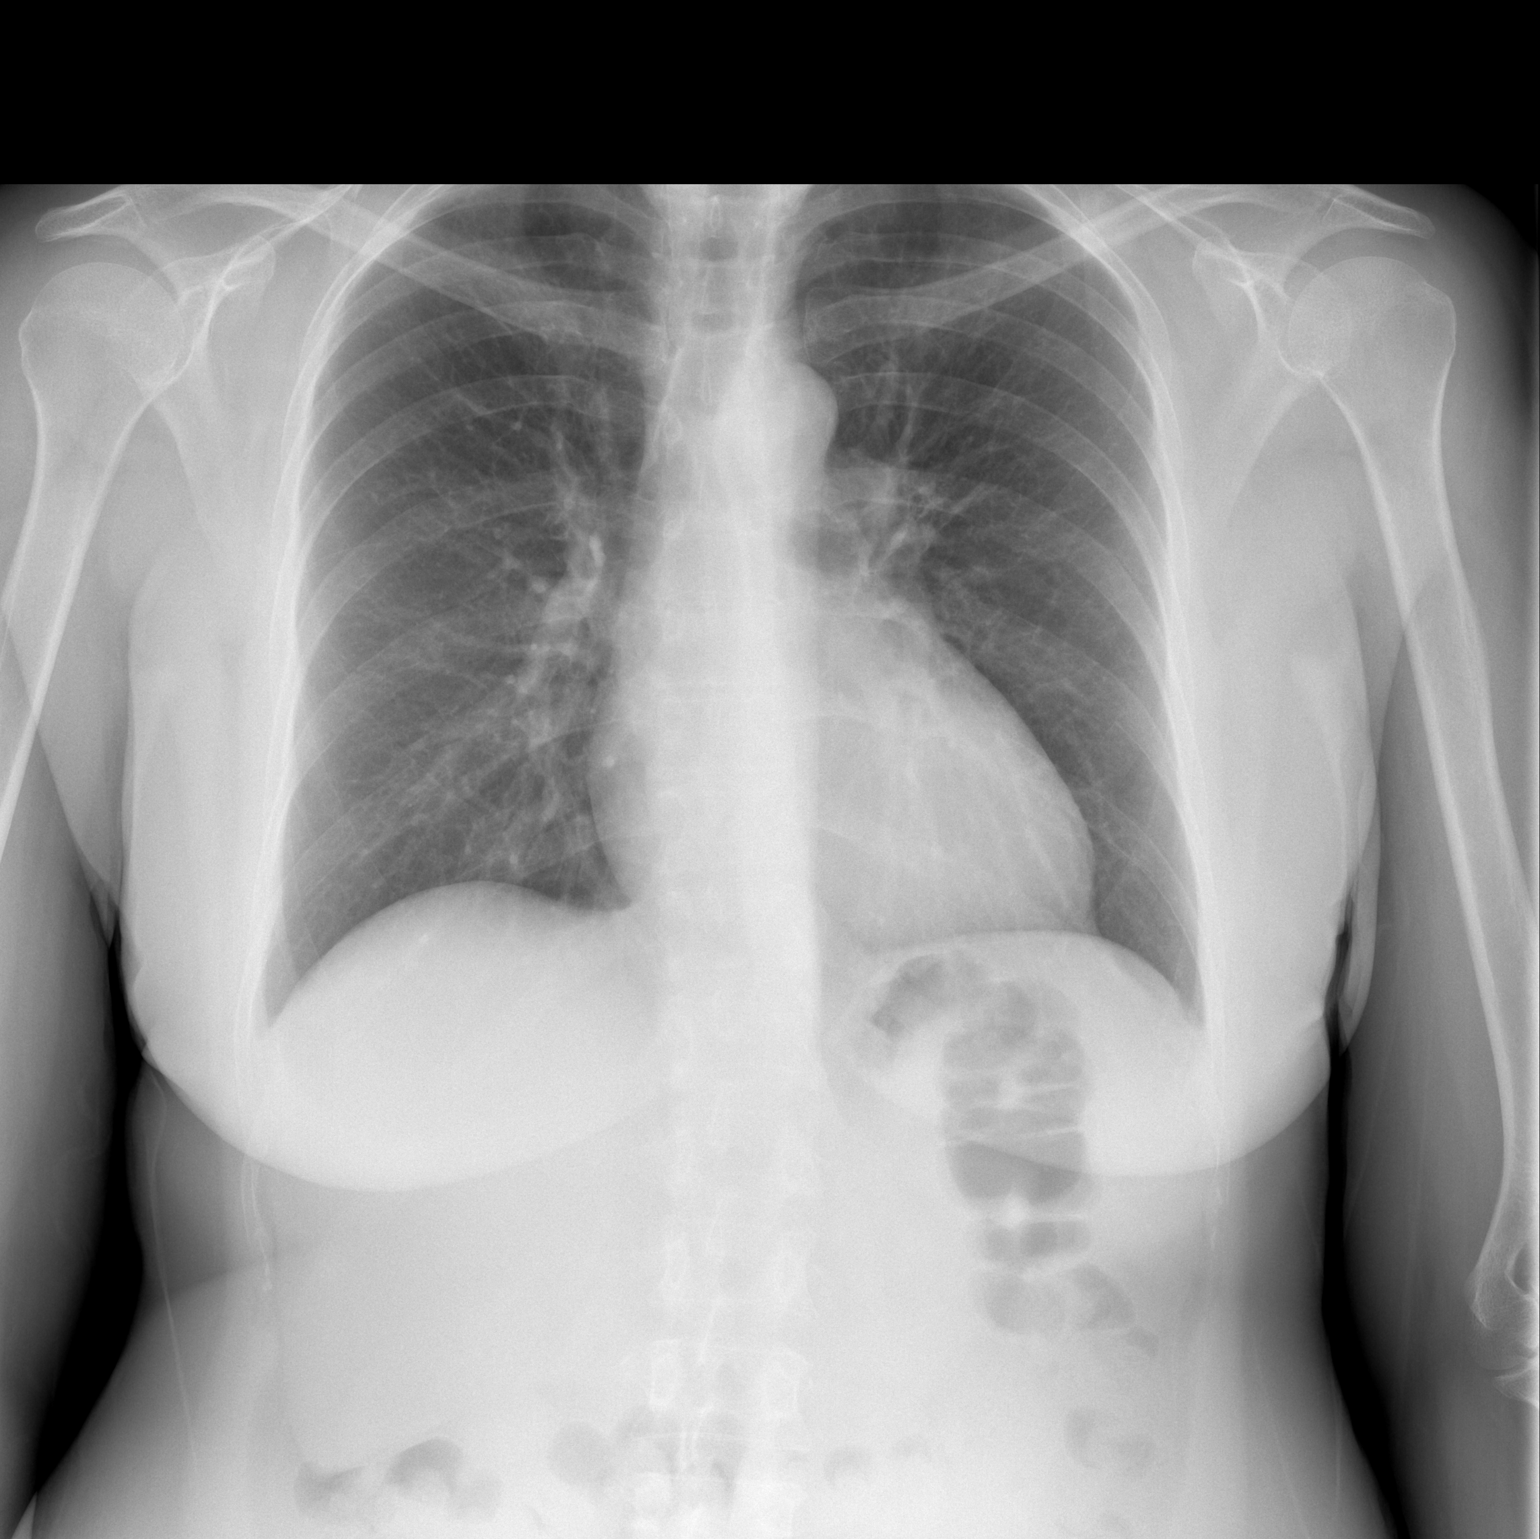

[w chest lat]
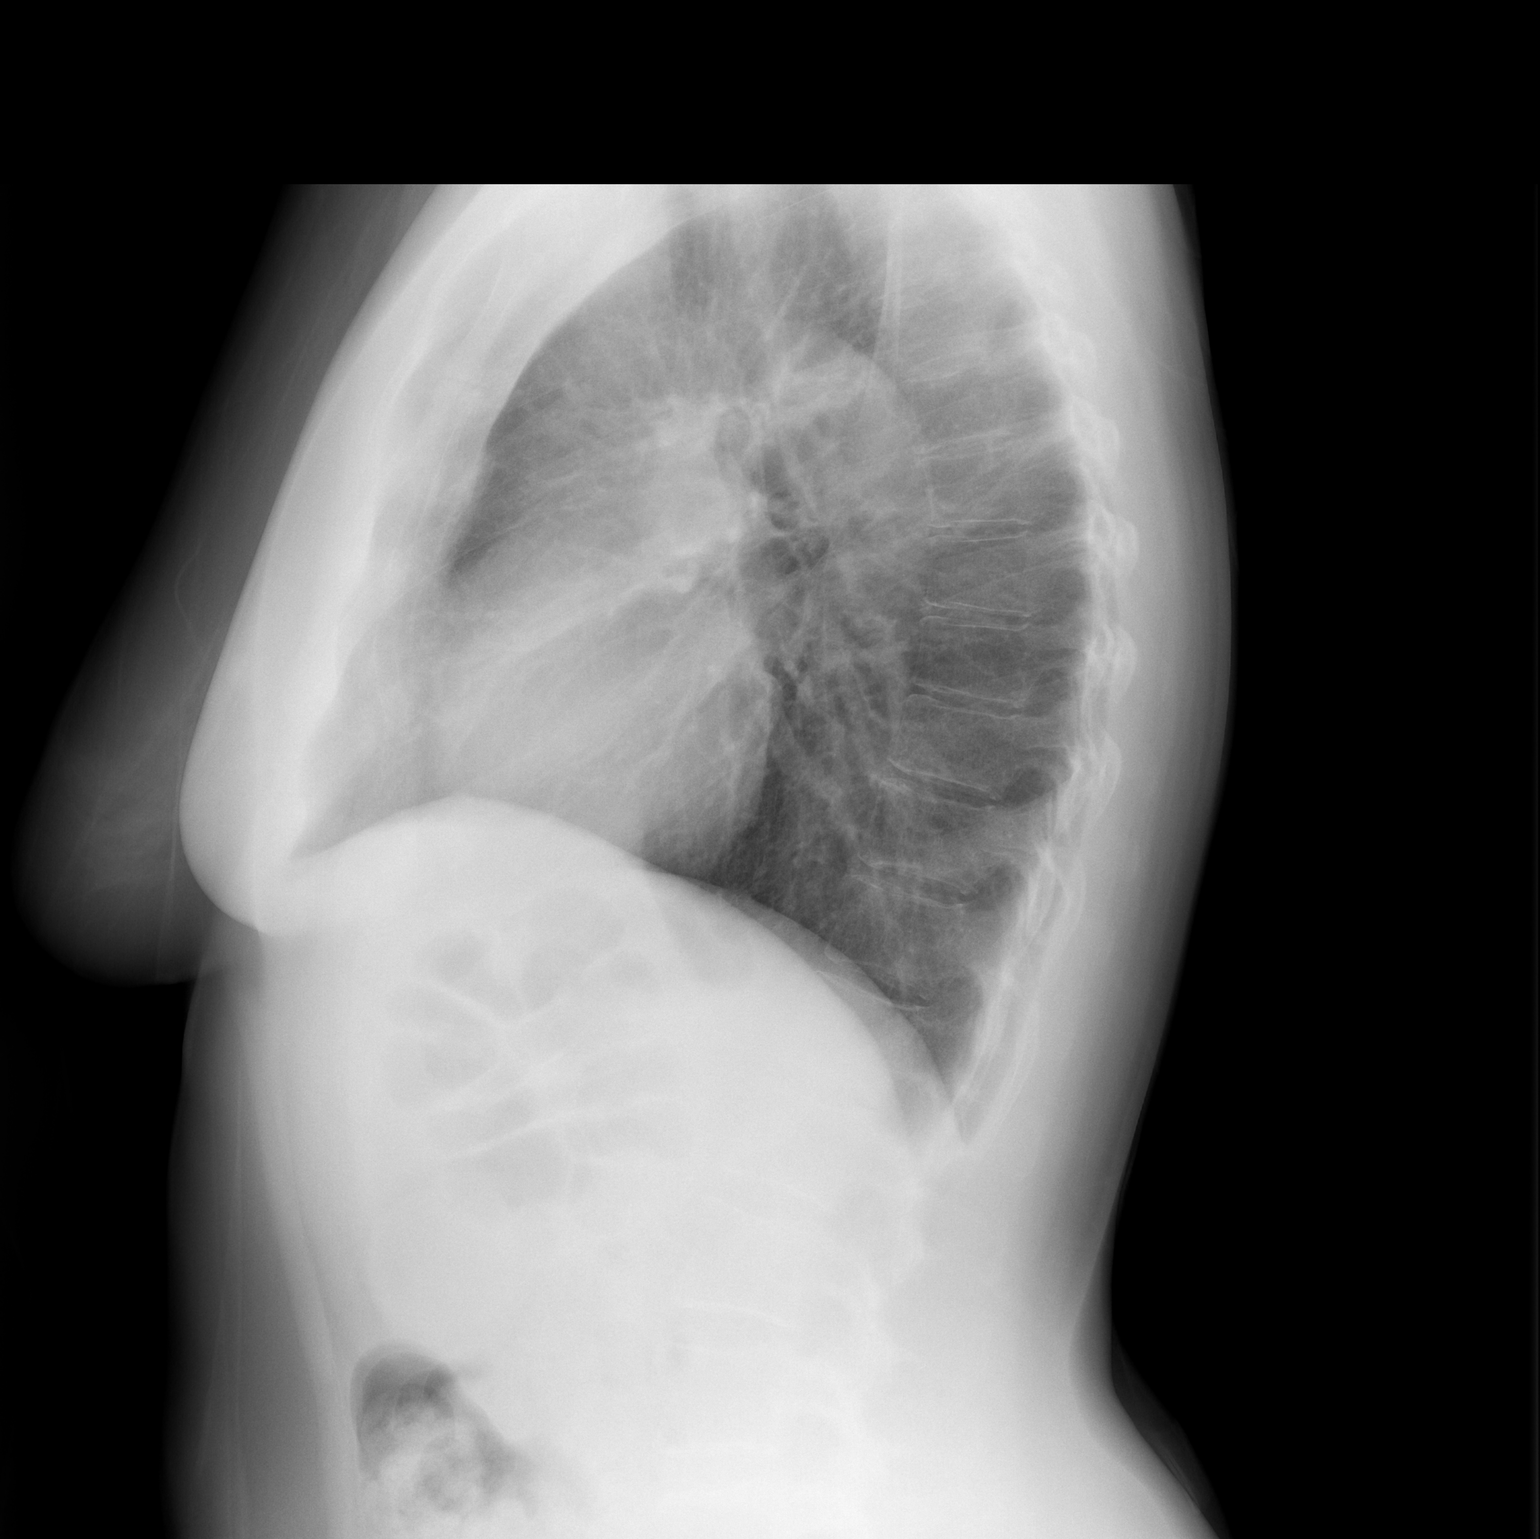

[2 of 2 positions shown; findings below may reference images not displayed]

FINDINGS: Mediastinum and hilar structures normal. Lungs are clear. No focal
infiltrate. No pleural effusion or pneumothorax. Heart size normal.
No acute bony abnormality. Thoracic spine scoliosis.
IMPRESSION: No acute cardiopulmonary disease.

## 2019-10-30 DIAGNOSIS — Z8619 Personal history of other infectious and parasitic diseases: Secondary | ICD-10-CM | POA: Diagnosis not present

## 2019-10-30 DIAGNOSIS — K604 Rectal fistula: Secondary | ICD-10-CM | POA: Diagnosis not present

## 2019-10-30 DIAGNOSIS — K219 Gastro-esophageal reflux disease without esophagitis: Secondary | ICD-10-CM | POA: Diagnosis not present

## 2019-12-19 NOTE — Progress Notes (Signed)
[facility] Note  12/20/2019     CHIEF COMPLAINT Patient presents for Retina Follow Up   HISTORY OF PRESENT ILLNESS: Gail Duncan is a 54 y.o. female who presents to the clinic today for:   HPI    Retina Follow Up    Patient presents with  Other.  In right eye.  This started months ago.  Severity is mild.  Duration of 6 months.  Since onset it is stable.  I, the attending physician,  performed the HPI with the patient and updated documentation appropriately.          Comments    54 y/o female pt here for 6 mo f/u for ERM OD.  No change in [place] OU.  Denies pain, FOL, floaters.  AT prn OU.       Last edited by Bernarda Caffey, MD on 12/20/2019  2:21 PM. (History)    pt states her eyes are doing well, she had some swelling and pain on her LLL, but by the time she was able to see the dr, it had gone away  Referring physician: Monna Fam, MD [place],  [place] [address]  HISTORICAL INFORMATION:   Selected notes from the [facility] Referred by ED for possible RD   CURRENT MEDICATIONS: Current Outpatient Medications (Ophthalmic Drugs)  Medication Sig  . dorzolamide-timolol (COSOPT) 22.3-6.8 MG/ML ophthalmic solution Place 1 drop into the right eye 2 (two) times daily.   No current facility-administered medications for this visit. (Ophthalmic Drugs)   Current Outpatient Medications (Other)  Medication Sig  . Ascorbic Acid (VITAMIN C) 1000 MG tablet Take 1,000 mg by mouth daily.  . chlorpheniramine (CHLOR-TRIMETON) 4 MG tablet Take 4 mg by mouth 2 (two) times daily as needed for allergies.  . Cholecalciferol (VITAMIN D3) 5000 units CAPS Take 5,000 capsules by mouth 3 (three) times a week.   . Coenzyme Q10-Vitamin E (COQ10-VITAMIN E) 100-10 MG-UNIT CAPS Take 1 capsule by mouth daily.   . cyclobenzaprine (FLEXERIL) 5 MG tablet Take 1 tablet (5 mg total) by mouth at bedtime.  . Magnesium Citrate (CITROMA PO) magnesium  citrate  . pantoprazole (PROTONIX) 20 MG tablet Take 20 mg by mouth daily.  . pantoprazole (PROTONIX) 40 MG tablet Take 40 mg by mouth daily.  . predniSONE (DELTASONE) 20 MG tablet 60 mg x3d, 40 mg x3d, 20 mg x2d, 10 mg x2d   No current facility-administered medications for this visit. (Other)      REVIEW OF SYSTEMS: ROS    Positive for: Gastrointestinal, Eyes   Negative for: Constitutional, Neurological, Skin, Genitourinary, Musculoskeletal, HENT, Endocrine, Cardiovascular, Respiratory, Psychiatric, Allergic/Imm, Heme/Lymph   Last edited by Matthew Folks, COA on 12/20/2019  2:09 PM. (History)       ALLERGIES Allergies  Allergen Reactions  . Asa [Aspirin] Other (See Comments)    Contraindicated due to medical condition    PAST MEDICAL HISTORY Past Medical History:  Diagnosis Date  . Anemia   . Cataract    Combined form OS  . GERD (gastroesophageal reflux disease)   . Hearing loss in right ear    Unknown cause, had full work up with Specialist in [place]  . History of chicken pox   . Retinal detachment    Past Surgical History:  Procedure Laterality Date  . ABDOMINAL EXPLORATION SURGERY    . ABLATION    . CATARACT EXTRACTION Right 11/23/2017   Dr. Herbert Deaner  . DENTAL SURGERY    .  EXPLORATORY LAPAROTOMY     "fibrous"  . EYE SURGERY Right 11/23/2017   Cat Sx - Dr. Herbert Deaner  . GAS INSERTION Right 08/09/2017   Procedure: INSERTION OF GAS;  Surgeon: Bernarda Caffey, MD;  Location: [facility];  Service: Ophthalmology;  Laterality: Right;  . LASER PHOTO ABLATION Left 08/09/2017   Procedure: LASER PHOTO ABLATION;  Surgeon: Bernarda Caffey, MD;  Location: [facility];  Service: Ophthalmology;  Laterality: Left;  . RETINAL DETACHMENT SURGERY Right 08/09/2017   SBP+PPV - Dr. Bernarda Caffey  . SCLERAL BUCKLE WITH POSSIBLE 25 GAUGE PARS PLANA VITRECTOMY Right 08/09/2017   Procedure: SCLERAL BUCKLE WITH 25 GAUGE PARS PLANA VITRECTOMY AND ENDOLASER;  Surgeon: Bernarda Caffey, MD;  Location: [facility];   Service: Ophthalmology;  Laterality: Right;    FAMILY HISTORY Family History  Problem Relation Age of Onset  . Diabetes Mother   . Prostate cancer Father   . Hypertension Father   . Retinal detachment Father   . Lung cancer Paternal Grandfather   . Amblyopia Neg Hx   . Blindness Neg Hx   . Cataracts Neg Hx   . Glaucoma Neg Hx   . Macular degeneration Neg Hx   . Strabismus Neg Hx   . Retinitis pigmentosa Neg Hx     SOCIAL HISTORY Social History   Tobacco Use  . Smoking status: Never Smoker  . Smokeless tobacco: Never Used  Vaping Use  . Vaping Use: Never used  Substance Use Topics  . Alcohol use: Yes    Alcohol/week: 0.0 standard drinks    Comment: occasional social use  . Drug use: No         OPHTHALMIC EXAM:  Base Eye Exam    Visual Acuity (Snellen - Linear)      Right Left   Dist cc 20/15 -2 20/30 -   Dist ph cc  20/30 +2   Correction: Glasses       Tonometry (Tonopen, 2:11 PM)      Right Left   Pressure 11 12       Pupils      Dark Light Shape React APD   Right 4 3 Round Brisk None   Left 4 3 Round Brisk None       Visual Fields (Counting fingers)      Left Right    Full Full       Extraocular Movement      Right Left    Full, Ortho Full, Ortho       Neuro/Psych    Oriented x3: Yes   Mood/Affect: Normal       Dilation    Both eyes: 1.0% Mydriacyl, 2.5% Phenylephrine @ 2:11 PM        Slit Lamp and Fundus Exam    External Exam      Right Left   External Normal        Slit Lamp Exam      Right Left   Lids/Lashes Mild Dermatochalasis - upper lid, mild Meibomian gland dysfunction Mild Dermatochalasis - upper lid, mild Meibomian gland dysfunction   Conjunctiva/Sclera White and quiet White and quiet   Cornea 1-2+ inferior Punctate epithelial erosions, Well healed cataract wound at 1030 trace diffuse Punctate epithelial erosions   Anterior Chamber Deep and quiet Deep and quiet   Iris Round and well dilated Round and well dilated    Lens PC IOL in good position; open PC 2-3+ Nuclear sclerosis with mild brunescence, 2-3+ Cortical cataract   Vitreous Post  vitrectomy; mild pigment Vitreous syneresis, Posterior vitreous detachment, condensations centrally over macula       Fundus Exam      Right Left   Disc Pink and Sharp, mildly Tilted disc Pink and Sharp, mildly Tilted disc, mild temporal Peripapillary atrophy, Compact   C/D Ratio 0.3 0.4   Macula Flat, good foveal reflex, Epiretinal membrane with striae - stable, No heme or edema Flat, blunted foveal reflex,  mild Retinal pigment epithelial mottling, No heme or edema   Vessels attenuated, mild tortuousity Vascular attenuation, Tortuous   Periphery Retina attached, good buckle height, good laser 360 on buckle and surrounding breaks, mild fibrosis over buckle at 10:30 -- stable from prior Attached, pigmented Lattice degeneration with atrophic holes at 0430; lattice at 1200 with atrophic break; good laser in place surrounding all lesions, No new RT/RD          IMAGING AND PROCEDURES  Imaging and Procedures for _0 @  OCT, Retina - OU - Both Eyes       Right Eye Quality was good. Central Foveal Thickness: 367. Progression has been stable. Findings include no SRF, epiretinal membrane, abnormal foveal contour, myopic contour, no IRF (Stable ERM, trace vitreous opacities).   Left Eye Quality was good. Central Foveal Thickness: 289. Progression has been stable. Findings include normal foveal contour, no IRF, no SRF, vitreomacular adhesion .   Notes *Images captured and stored on drive  Diagnosis / Impression:  OD: Stable ERM, trace vitreous opacities OS: NFP, No IRF/SRF, VMA-stable  Clinical management:  See below  Abbreviations: NFP - Normal foveal profile. CME - cystoid macular edema. PED - pigment epithelial detachment. IRF - intraretinal fluid. SRF - subretinal fluid. EZ - ellipsoid zone. ERM - epiretinal membrane. ORA - outer retinal atrophy. ORT - outer  retinal tubulation. SRHM - subretinal hyper-reflective material                  ASSESSMENT/PLAN:    ICD-10-CM   1. RD (retinal detachment), right  H33.21   2. Epiretinal membrane (ERM) of right eye  H35.371   3. 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 Posterior vitreous detachment of left eye  H43.812   7. Pseudophakia of right eye  Z96.1   8. PCO (posterior capsular opacification), right  H26.491   9. Combined forms of age-related cataract of left eye  H25.812     1. Rhegmatogenous retinal detachment, RIGHT eye  - bullous superotemporal mac on detachment, onset of foveal involvement Saturday, 01/30/17 by history  - detached from 1030-1200 oclock, large horseshoe tear from 1030-1100 and linear tear at 1130 within bed of lattice  - s/p SBP + PPV/EL/FAX/14% C3F8 OD, 06.10.19             - retina attached and in good position -- good buckle height and laser around breaks  - BCVA 20/15 OD             - IOP okay today at 11  2,3.  Mild ERM OD  - persistent ERM w/ early pucker -- stable from prior  - asymptomatic, no metamorphopsia  - BCVA remains 20/15 OD  - no indication for surgery at this time  - monitor for now  - F/U in 9-12 months, sooner prn -- DFE/OCT  4,5. Lattice degeneration OU;  w/ atrophic breaks left  eye  - OD - superotemporal lattice at site of breaks and detachment  - OS - patches of lattice at 12 and 430 with a single atrophic breaks at each site  - s/p laser retinopexy OS at time of surgery   - monitor  6. PVD / vitreous syneresis OS  - Discussed findings and prognosis  - No RT or RD on 360 peripheral exam  - Reviewed s/s of RT/RD              - Strict return precautions for any such RT/RD symptoms  7,8. Pseudophakia OD  - cataract OD progressively worsened following PPV  - s/p CE/IOL OD by expert surgeon Dr. Herbert Deaner on  09.24.19  - beautiful surgery, doing well with new glasses  - BCVA 20/15 OD             - s/p yag cap OD (4.14.21) -- excellent opening  9. Combined form age-related cataract OS-   - The symptoms of cataract, surgical options, and treatments and risks were discussed with patient.  - discussed diagnosis and progression  - under the expert care of Dr. Herbert Deaner  - pt reports subjective decline in vision OS  - pt to schedule f/u for cataract OS with Dr. Herbert Deaner     Ophthalmic Meds Ordered this visit:  No orders of the defined types were placed in this encounter.      Return for f/u 9-12 months, ERM OD, DFE, OCT.  There are no Patient Instructions on file for this visit.   Explained the diagnoses, plan, and follow up with the patient and they expressed understanding.  Patient expressed understanding of the importance of proper follow up care.   This document serves as a record of services personally performed by Gardiner Sleeper, MD, PhD. It was created on their behalf by Roselee Nova, COMT. The creation of this record is the provider's dictation and/or activities during the visit.  Electronically signed by: Roselee Nova, COMT 12/20/19 2:51 PM  This document serves as a record of services personally performed by Gardiner Sleeper, MD, PhD. It was created on their behalf by San Jetty. Owens Shark, OA an ophthalmic technician. The creation of this record is the provider's dictation and/or activities during the visit.    Electronically signed by: San Jetty. Owens Shark, [place] 10.20.2021 2:51 PM  Gardiner Sleeper, M.D., Ph.D. Diseases & Surgery of the Retina and Vitreous [facility]  I have reviewed the above documentation for accuracy and completeness, and I agree with the above. Gardiner Sleeper, M.D., Ph.D. 12/20/19 2:51 PM  Abbreviations: M myopia (nearsighted); A astigmatism; H hyperopia (farsighted); P presbyopia; Mrx spectacle prescription;  CTL contact lenses; OD right eye; OS  left eye; OU both eyes  XT exotropia; ET esotropia; PEK punctate epithelial keratitis; PEE punctate epithelial erosions; DES dry eye syndrome; MGD meibomian gland dysfunction; ATs artificial tears; PFAT's preservative free artificial tears; [place] nuclear sclerotic cataract; PSC posterior subcapsular cataract; ERM epi-retinal membrane; PVD posterior vitreous detachment; RD retinal detachment; DM diabetes mellitus; DR diabetic retinopathy; NPDR non-proliferative diabetic retinopathy; PDR proliferative diabetic retinopathy; CSME clinically significant macular edema; DME diabetic macular edema; dbh dot blot hemorrhages; CWS cotton wool spot; POAG primary open angle glaucoma; C/D cup-to-disc ratio; HVF humphrey visual field; GVF goldmann visual field; OCT optical coherence tomography; IOP intraocular pressure; BRVO Branch retinal vein occlusion; CRVO central retinal vein occlusion; CRAO central retinal artery occlusion; BRAO branch retinal artery occlusion; RT retinal tear; SB  scleral buckle; PPV pars plana vitrectomy; VH Vitreous hemorrhage; PRP panretinal laser photocoagulation; IVK intravitreal kenalog; VMT vitreomacular traction; MH Macular hole;  NVD neovascularization of the disc; NVE neovascularization elsewhere; AREDS age related eye disease study; ARMD age related macular degeneration; POAG primary open angle glaucoma; EBMD epithelial/anterior basement membrane dystrophy; ACIOL anterior chamber intraocular lens; IOL intraocular lens; PCIOL posterior chamber intraocular lens; Phaco/IOL phacoemulsification with intraocular lens placement; [place] photorefractive keratectomy; LASIK laser assisted in situ keratomileusis; HTN hypertension; DM diabetes mellitus; COPD chronic obstructive pulmonary disease

## 2019-12-20 ENCOUNTER — Other Ambulatory Visit: Payer: Self-pay

## 2019-12-20 ENCOUNTER — Encounter (INDEPENDENT_AMBULATORY_CARE_PROVIDER_SITE_OTHER): Payer: Self-pay | Admitting: Ophthalmology

## 2019-12-20 ENCOUNTER — Ambulatory Visit (INDEPENDENT_AMBULATORY_CARE_PROVIDER_SITE_OTHER): Payer: Federal, State, Local not specified - PPO | Admitting: Ophthalmology

## 2019-12-20 DIAGNOSIS — H26491 Other secondary cataract, right eye: Secondary | ICD-10-CM

## 2019-12-20 DIAGNOSIS — H35413 Lattice degeneration of retina, bilateral: Secondary | ICD-10-CM | POA: Diagnosis not present

## 2019-12-20 DIAGNOSIS — Z961 Presence of intraocular lens: Secondary | ICD-10-CM

## 2019-12-20 DIAGNOSIS — H35371 Puckering of macula, right eye: Secondary | ICD-10-CM | POA: Diagnosis not present

## 2019-12-20 DIAGNOSIS — H3581 Retinal edema: Secondary | ICD-10-CM

## 2019-12-20 DIAGNOSIS — H33332 Multiple defects of retina without detachment, left eye: Secondary | ICD-10-CM

## 2019-12-20 DIAGNOSIS — H25812 Combined forms of age-related cataract, left eye: Secondary | ICD-10-CM

## 2019-12-20 DIAGNOSIS — H43812 Vitreous degeneration, left eye: Secondary | ICD-10-CM

## 2019-12-20 DIAGNOSIS — H3321 Serous retinal detachment, right eye: Secondary | ICD-10-CM

## 2020-01-17 DIAGNOSIS — H25012 Cortical age-related cataract, left eye: Secondary | ICD-10-CM | POA: Diagnosis not present

## 2020-01-17 DIAGNOSIS — H2512 Age-related nuclear cataract, left eye: Secondary | ICD-10-CM | POA: Diagnosis not present

## 2020-01-17 DIAGNOSIS — H35413 Lattice degeneration of retina, bilateral: Secondary | ICD-10-CM | POA: Diagnosis not present

## 2020-01-17 DIAGNOSIS — H40013 Open angle with borderline findings, low risk, bilateral: Secondary | ICD-10-CM | POA: Diagnosis not present

## 2020-01-30 DIAGNOSIS — Z01419 Encounter for gynecological examination (general) (routine) without abnormal findings: Secondary | ICD-10-CM | POA: Diagnosis not present

## 2020-01-31 HISTORY — PX: EYE SURGERY: SHX253

## 2020-01-31 HISTORY — PX: CATARACT EXTRACTION: SUR2

## 2020-02-06 DIAGNOSIS — H25012 Cortical age-related cataract, left eye: Secondary | ICD-10-CM | POA: Diagnosis not present

## 2020-02-06 DIAGNOSIS — H25812 Combined forms of age-related cataract, left eye: Secondary | ICD-10-CM | POA: Diagnosis not present

## 2020-02-06 DIAGNOSIS — H2512 Age-related nuclear cataract, left eye: Secondary | ICD-10-CM | POA: Diagnosis not present

## 2020-03-07 DIAGNOSIS — H31092 Other chorioretinal scars, left eye: Secondary | ICD-10-CM | POA: Diagnosis not present

## 2020-03-07 DIAGNOSIS — Z961 Presence of intraocular lens: Secondary | ICD-10-CM | POA: Diagnosis not present

## 2020-03-07 DIAGNOSIS — H33011 Retinal detachment with single break, right eye: Secondary | ICD-10-CM | POA: Diagnosis not present

## 2020-03-20 ENCOUNTER — Other Ambulatory Visit: Payer: Self-pay

## 2020-03-20 ENCOUNTER — Ambulatory Visit (INDEPENDENT_AMBULATORY_CARE_PROVIDER_SITE_OTHER): Payer: Federal, State, Local not specified - PPO | Admitting: Ophthalmology

## 2020-03-20 ENCOUNTER — Encounter (INDEPENDENT_AMBULATORY_CARE_PROVIDER_SITE_OTHER): Payer: Self-pay | Admitting: Ophthalmology

## 2020-03-20 DIAGNOSIS — H3581 Retinal edema: Secondary | ICD-10-CM

## 2020-03-20 DIAGNOSIS — Z961 Presence of intraocular lens: Secondary | ICD-10-CM

## 2020-03-20 DIAGNOSIS — H3321 Serous retinal detachment, right eye: Secondary | ICD-10-CM | POA: Diagnosis not present

## 2020-03-20 DIAGNOSIS — H35371 Puckering of macula, right eye: Secondary | ICD-10-CM | POA: Diagnosis not present

## 2020-03-20 DIAGNOSIS — H43812 Vitreous degeneration, left eye: Secondary | ICD-10-CM

## 2020-03-20 DIAGNOSIS — H33332 Multiple defects of retina without detachment, left eye: Secondary | ICD-10-CM

## 2020-03-20 DIAGNOSIS — H35413 Lattice degeneration of retina, bilateral: Secondary | ICD-10-CM | POA: Diagnosis not present

## 2020-03-20 NOTE — Progress Notes (Signed)
[facility] Note  03/20/2020     CHIEF COMPLAINT Patient presents for Blurred Vision   HISTORY OF PRESENT ILLNESS: Gail Duncan is a 55 y.o. female who presents to the clinic today for:   HPI    Blurred Vision    In right eye.  Onset was sudden.  Vision is blurred and hazy.  Severity is moderate.  This started 6 days ago.  Occurring intermittently.  It is worse throughout the day.  Context:  distance vision, mid-range vision and near vision.  Since onset it is gradually improving.  Associated symptoms include dryness and redness.  Treatments tried include artificial tears.  Response to treatment was moderate improvement.  I, the attending physician,  performed the HPI with the patient and updated documentation appropriately.          Comments    55 y/o female pt here for eval of decreased VA OD x 6 days.  Sudden onset; eye was initially red when it occurred, and eye had been very dry.  Eye looks and feels better with use of AT, and VA OD is gradually improving.  Seeing some small floaters OS.  Denies pain, FOL.  Had cat sx os w/Dr. Hecker 12.2021.  VA OS improved s/p cat sx, and pt is waiting on new glasses.  Soothe XP prn OU.       Last edited by Bernarda Caffey, MD on 03/21/2020 10:33 PM. (History)    pt states she has had decreased VA OD for about a week, she states her eye has been sensitive to light especially when watching the TV, that eye has also been very dry recently, she had cataract sx OS with Dr. Herbert Deaner in December 2021 and states now when she looks at a white background she sees what looks like "air bubbles in the water"  Referring physician: Howard Pouch A, DO [address] [place],  [place] [id number]  HISTORICAL INFORMATION:   Selected notes from the [facility] Referred by ED for possible RD   CURRENT MEDICATIONS: Current Outpatient Medications (Ophthalmic Drugs)  Medication Sig  . dorzolamide-timolol (COSOPT) 22.3-6.8 MG/ML  ophthalmic solution Place 1 drop into the right eye 2 (two) times daily. (Patient not taking: Reported on 03/20/2020)   No current facility-administered medications for this visit. (Ophthalmic Drugs)   Current Outpatient Medications (Other)  Medication Sig  . Ascorbic Acid (VITAMIN C) 1000 MG tablet Take 1,000 mg by mouth daily.  . chlorpheniramine (CHLOR-TRIMETON) 4 MG tablet Take 4 mg by mouth 2 (two) times daily as needed for allergies.  . Cholecalciferol (VITAMIN D3) 5000 units CAPS Take 5,000 capsules by mouth 3 (three) times a week.   . Coenzyme Q10-Vitamin E (COQ10-VITAMIN E) 100-10 MG-UNIT CAPS Take 1 capsule by mouth daily.   . cyclobenzaprine (FLEXERIL) 5 MG tablet Take 1 tablet (5 mg total) by mouth at bedtime.  . Magnesium Citrate (CITROMA PO) magnesium citrate  . pantoprazole (PROTONIX) 20 MG tablet Take 20 mg by mouth daily.  . pantoprazole (PROTONIX) 40 MG tablet Take 40 mg by mouth daily.  . predniSONE (DELTASONE) 20 MG tablet 60 mg x3d, 40 mg x3d, 20 mg x2d, 10 mg x2d   No current facility-administered medications for this visit. (Other)      REVIEW OF SYSTEMS: ROS    Positive for: Gastrointestinal, HENT, Eyes   Negative for: Constitutional, Neurological, Skin, Genitourinary, Musculoskeletal, Endocrine, Cardiovascular, Respiratory, Psychiatric, Allergic/Imm, Heme/Lymph   Last edited by Renold Genta,  Judithann Graves, COA on 03/20/2020  1:37 PM. (History)       ALLERGIES Allergies  Allergen Reactions  . Asa [Aspirin] Other (See Comments)    Contraindicated due to medical condition    PAST MEDICAL HISTORY Past Medical History:  Diagnosis Date  . Anemia   . GERD (gastroesophageal reflux disease)   . Hearing loss in right ear    Unknown cause, had full work up with Specialist in [place]  . History of chicken pox   . Retinal detachment    Past Surgical History:  Procedure Laterality Date  . ABDOMINAL EXPLORATION SURGERY    . ABLATION    . CATARACT EXTRACTION Right 11/23/2017    Dr. Herbert Deaner  . CATARACT EXTRACTION Left 01/2020   Dr. Herbert Deaner  . DENTAL SURGERY    . EXPLORATORY LAPAROTOMY     "fibrous"  . EYE SURGERY Right 11/23/2017   Cat Sx - Dr. Herbert Deaner  . EYE SURGERY Left 01/2020   Cat Sx - Dr. Herbert Deaner  . EYE SURGERY Right 08/09/2017   SBP+PPV - Dr. Bernarda Caffey  . GAS INSERTION Right 08/09/2017   Procedure: INSERTION OF GAS;  Surgeon: Bernarda Caffey, MD;  Location: [place];  Service: Ophthalmology;  Laterality: Right;  . LASER PHOTO ABLATION Left 08/09/2017   Procedure: LASER PHOTO ABLATION;  Surgeon: Bernarda Caffey, MD;  Location: [place];  Service: Ophthalmology;  Laterality: Left;  . RETINAL DETACHMENT SURGERY Right 08/09/2017   SBP+PPV - Dr. Bernarda Caffey  . SCLERAL BUCKLE WITH POSSIBLE 25 GAUGE PARS PLANA VITRECTOMY Right 08/09/2017   Procedure: SCLERAL BUCKLE WITH 25 GAUGE PARS PLANA VITRECTOMY AND ENDOLASER;  Surgeon: Bernarda Caffey, MD;  Location: [place];  Service: Ophthalmology;  Laterality: Right;  . YAG LASER APPLICATION Right 37/12/6267    FAMILY HISTORY Family History  Problem Relation Age of Onset  . Diabetes Mother   . Prostate cancer Father   . Hypertension Father   . Retinal detachment Father   . Lung cancer Paternal Grandfather   . Amblyopia Neg Hx   . Blindness Neg Hx   . Cataracts Neg Hx   . Glaucoma Neg Hx   . Macular degeneration Neg Hx   . Strabismus Neg Hx   . Retinitis pigmentosa Neg Hx     SOCIAL HISTORY Social History   Tobacco Use  . Smoking status: Never Smoker  . Smokeless tobacco: Never Used  Vaping Use  . Vaping Use: Never used  Substance Use Topics  . Alcohol use: Yes    Alcohol/week: 0.0 standard drinks    Comment: occasional social use  . Drug use: No         OPHTHALMIC EXAM:  Base Eye Exam    Visual Acuity (Snellen - Linear)      Right Left   Dist [place] 20/30 -2 20/30   Dist ph [place] 20/20 20/25 -       Tonometry (Tonopen, 1:43 PM)      Right Left   Pressure 11 12       Pupils      Dark Light Shape  React APD   Right 4 3 Round Brisk None   Left 4 3 Round Brisk None       Visual Fields (Counting fingers)      Left Right    Full Full       Extraocular Movement      Right Left    Full, Ortho Full, Ortho       Neuro/Psych  Oriented x3: Yes   Mood/Affect: Normal       Dilation    Both eyes: 1.0% Mydriacyl, 2.5% Phenylephrine @ 1:43 PM        Slit Lamp and Fundus Exam    External Exam      Right Left   External Normal        Slit Lamp Exam      Right Left   Lids/Lashes Mild Dermatochalasis - upper lid, mild Meibomian gland dysfunction Mild Dermatochalasis - upper lid, mild Meibomian gland dysfunction   Conjunctiva/Sclera White and quiet White and quiet   Cornea 1-2+ inferior Punctate epithelial erosions, Well healed cataract wound at 1030 1+Punctate epithelial erosions   Anterior Chamber Deep and quiet Deep and quiet   Iris Round and well dilated Round and well dilated   Lens PC IOL in good position; open PC PC IOL in good position   Vitreous Post vitrectomy; mild pigment Vitreous syneresis, Posterior vitreous detachment, vitreous condensations        Fundus Exam      Right Left   Disc Pink and Sharp, mildly Tilted disc, temporal PPA Pink and Sharp, mildly Tilted disc, mild temporal Peripapillary atrophy, Compact   C/D Ratio 0.3 0.4   Macula Flat, good foveal reflex, Epiretinal membrane with striae, No heme or edema Flat, blunted foveal reflex,  mild Retinal pigment epithelial mottling, No heme or edema   Vessels attenuated, mild tortuousity Vascular attenuation, Tortuous   Periphery Retina attached, good buckle height, good laser 360 on buckle and surrounding breaks, mild fibrosis over buckle at 10:30 -- stable from prior Attached, pigmented Lattice degeneration with atrophic holes at 0430; lattice at 1200 with atrophic break; good laser in place surrounding all lesions, No new RT/RD          IMAGING AND PROCEDURES  Imaging and Procedures for _0 @  OCT,  Retina - OU - Both Eyes       Right Eye Quality was good. Central Foveal Thickness: 347. Progression has worsened. Findings include no SRF, epiretinal membrane, abnormal foveal contour, myopic contour, no IRF (Mild interval progression ERM, trace vitreous opacities).   Left Eye Quality was good. Central Foveal Thickness: 282. Progression has been stable. Findings include normal foveal contour, no IRF, no SRF, vitreomacular adhesion  (Interval release of central VMA, now partial PVD with trace vitreous opacities).   Notes *Images captured and stored on drive  Diagnosis / Impression:  OD: Mild interval progression ERM, trace vitreous opacities OS: NFP, no IRF/SRF; Interval release of central VMA, now partial PVD with trace vitreous opacities  Clinical management:  See below  Abbreviations: NFP - Normal foveal profile. CME - cystoid macular edema. PED - pigment epithelial detachment. IRF - intraretinal fluid. SRF - subretinal fluid. EZ - ellipsoid zone. ERM - epiretinal membrane. ORA - outer retinal atrophy. ORT - outer retinal tubulation. SRHM - subretinal hyper-reflective material                  ASSESSMENT/PLAN:    ICD-10-CM   1. RD (retinal detachment), right  H33.21   2. Epiretinal membrane (ERM) of right eye  H35.371   3. 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 Posterior vitreous detachment of left eye  H43.812   7. Pseudophakia of right eye  Z96.1   8. Pseudophakia of left  eye  Z96.1    **pt presents acutely today for decreased VA OD, dry eyes and light sensitivity**  - BCVA 20/20 OD  - exam with mild corneal dryness  - discussed increasing use of ATs  1. Rhegmatogenous retinal detachment, RIGHT eye  - bullous superotemporal mac on detachment, onset of foveal involvement Saturday, 01/30/17 by history  - detached from 1030-1200 oclock,  large horseshoe tear from 1030-1100 and linear tear at 1130 within bed of lattice  - s/p SBP + PPV/EL/FAX/14% C3F8 OD, 06.10.19             - retina attached and in good position -- good buckle height and laser around breaks  - BCVA 20/20 OD             - IOP okay today at 11  2,3.  Mild ERM OD  - persistent ERM w/ early pucker -- ?mild progression from prior  - asymptomatic, no metamorphopsia  - BCVA remains 20/20 OD  - no indication for surgery at this time  - monitor for now  - F/U in 9-12 months, sooner prn -- DFE/OCT  4,5. Lattice degeneration OU;  w/ atrophic breaks left eye  - OD - superotemporal lattice at site of breaks and detachment  - OS - patches of lattice at 12 and 430 with a single atrophic breaks at each site  - s/p laser retinopexy OS at time of surgery   - monitor  6. PVD / vitreous syneresis OS  - Discussed findings and prognosis  - No RT or RD on 360 peripheral exam  - Reviewed s/s of RT/RD              - Strict return precautions for any such RT/RD symptoms  7,8. Pseudophakia OU  - cataract OD progressively worsened following PPV  - s/p CE/IOL OD by expert surgeon Dr. Herbert Deaner on 09.24.19             - s/p CE/IOL OS by expert surgeon Dr. Herbert Deaner [phone]  - beautiful surgeries; new glasses on order  - BCVA 20/20 OD; 20/25 OS             - s/p yag cap OD (4.14.21) -- excellent opening  Ophthalmic Meds Ordered this visit:  No orders of the defined types were placed in this encounter.     Return for f/u as scheduled in September.  There are no Patient Instructions on file for this visit.  Explained the diagnoses, plan, and follow up with the patient and they expressed understanding.  Patient expressed understanding of the importance of proper follow up care.   This document serves as a record of services personally performed by Gardiner Sleeper, MD, PhD. It was created on their behalf by Estill Bakes, COT an ophthalmic technician. The creation of this  record is the provider's dictation and/or activities during the visit.    Electronically signed by: Estill Bakes, COT 1.19.22 @ 10:37 PM  Gardiner Sleeper, M.D., Ph.D. Diseases & Surgery of the Retina and [place] 03/20/2020   I have reviewed the above documentation for accuracy and completeness, and I agree with the above. Gardiner Sleeper, M.D., Ph.D. 03/21/20 10:37 PM   Abbreviations: M myopia (nearsighted); A astigmatism; H hyperopia (farsighted); P presbyopia; Mrx spectacle prescription;  CTL contact lenses; OD right eye; OS left eye; OU both eyes  XT exotropia; ET esotropia; PEK punctate epithelial keratitis; PEE punctate epithelial erosions; DES dry eye  syndrome; MGD meibomian gland dysfunction; ATs artificial tears; PFAT's preservative free artificial tears; [place] nuclear sclerotic cataract; PSC posterior subcapsular cataract; ERM epi-retinal membrane; PVD posterior vitreous detachment; RD retinal detachment; DM diabetes mellitus; DR diabetic retinopathy; NPDR non-proliferative diabetic retinopathy; PDR proliferative diabetic retinopathy; CSME clinically significant macular edema; DME diabetic macular edema; dbh dot blot hemorrhages; CWS cotton wool spot; POAG primary open angle glaucoma; C/D cup-to-disc ratio; HVF humphrey visual field; GVF goldmann visual field; OCT optical coherence tomography; IOP intraocular pressure; BRVO Branch retinal vein occlusion; CRVO central retinal vein occlusion; CRAO central retinal artery occlusion; BRAO branch retinal artery occlusion; RT retinal tear; SB scleral buckle; PPV pars plana vitrectomy; VH Vitreous hemorrhage; PRP panretinal laser photocoagulation; IVK intravitreal kenalog; VMT vitreomacular traction; MH Macular hole;  NVD neovascularization of the disc; NVE neovascularization elsewhere; AREDS age related eye disease study; ARMD age related macular degeneration; POAG primary open angle glaucoma; EBMD  epithelial/anterior basement membrane dystrophy; ACIOL anterior chamber intraocular lens; IOL intraocular lens; PCIOL posterior chamber intraocular lens; Phaco/IOL phacoemulsification with intraocular lens placement; [place] photorefractive keratectomy; LASIK laser assisted in situ keratomileusis; HTN hypertension; DM diabetes mellitus; COPD chronic obstructive pulmonary disease

## 2020-03-21 ENCOUNTER — Encounter (INDEPENDENT_AMBULATORY_CARE_PROVIDER_SITE_OTHER): Payer: Self-pay | Admitting: Ophthalmology

## 2020-04-24 ENCOUNTER — Other Ambulatory Visit: Payer: Self-pay

## 2020-04-24 ENCOUNTER — Encounter (INDEPENDENT_AMBULATORY_CARE_PROVIDER_SITE_OTHER): Payer: Self-pay | Admitting: Ophthalmology

## 2020-04-24 ENCOUNTER — Ambulatory Visit (INDEPENDENT_AMBULATORY_CARE_PROVIDER_SITE_OTHER): Payer: Federal, State, Local not specified - PPO | Admitting: Ophthalmology

## 2020-04-24 DIAGNOSIS — H43812 Vitreous degeneration, left eye: Secondary | ICD-10-CM | POA: Diagnosis not present

## 2020-04-24 DIAGNOSIS — H35413 Lattice degeneration of retina, bilateral: Secondary | ICD-10-CM

## 2020-04-24 DIAGNOSIS — H3321 Serous retinal detachment, right eye: Secondary | ICD-10-CM | POA: Diagnosis not present

## 2020-04-24 DIAGNOSIS — H35371 Puckering of macula, right eye: Secondary | ICD-10-CM | POA: Diagnosis not present

## 2020-04-24 DIAGNOSIS — H3581 Retinal edema: Secondary | ICD-10-CM | POA: Diagnosis not present

## 2020-04-24 DIAGNOSIS — Z961 Presence of intraocular lens: Secondary | ICD-10-CM

## 2020-04-24 DIAGNOSIS — H33332 Multiple defects of retina without detachment, left eye: Secondary | ICD-10-CM

## 2020-04-24 NOTE — Progress Notes (Signed)
[facility] Note  04/24/2020     CHIEF COMPLAINT Patient presents for Eye Problem   HISTORY OF PRESENT ILLNESS: Gail Duncan is a 55 y.o. female who presents to the clinic today for:   HPI    Patient started having floaters and FOLs yesterday around 5:15 OS.  This started to improve today until about 12pm when a large black floater started moving in vision.  Her overall vision seems to be ok.  She had cataract sx 01/2020 OS with Dr. Herbert Deaner.  OD appears ok.  Systane PF used 4 times today.     Last edited by Leonie Douglas, COA on 04/24/2020  1:51 PM. (History)    pt states around 5:00 yesterday she started to see new fol, she states they were still there this morning, but then stopped, she states around noon today, she saw a large floater come across her vision   Referring physician: Howard Pouch A, DO [id number] [place],  [place] [address]  HISTORICAL INFORMATION:   Selected notes from the [facility] Referred by ED for possible RD   CURRENT MEDICATIONS: Current Outpatient Medications (Ophthalmic Drugs)  Medication Sig   dorzolamide-timolol (COSOPT) 22.3-6.8 MG/ML ophthalmic solution Place 1 drop into the right eye 2 (two) times daily. (Patient not taking: No sig reported)   No current facility-administered medications for this visit. (Ophthalmic Drugs)   Current Outpatient Medications (Other)  Medication Sig   Ascorbic Acid (VITAMIN C) 1000 MG tablet Take 1,000 mg by mouth daily.   chlorpheniramine (CHLOR-TRIMETON) 4 MG tablet Take 4 mg by mouth 2 (two) times daily as needed for allergies.   Cholecalciferol (VITAMIN D3) 5000 units CAPS Take 5,000 capsules by mouth 3 (three) times a week.    Coenzyme Q10-Vitamin E (COQ10-VITAMIN E) 100-10 MG-UNIT CAPS Take 1 capsule by mouth daily.    cyclobenzaprine (FLEXERIL) 5 MG tablet Take 1 tablet (5 mg total) by mouth at bedtime.   Magnesium Citrate (CITROMA PO) magnesium citrate    pantoprazole (PROTONIX) 20 MG tablet Take 20 mg by mouth daily.   pantoprazole (PROTONIX) 40 MG tablet Take 40 mg by mouth daily.   predniSONE (DELTASONE) 20 MG tablet 60 mg x3d, 40 mg x3d, 20 mg x2d, 10 mg x2d (Patient not taking: Reported on 04/24/2020)   No current facility-administered medications for this visit. (Other)      REVIEW OF SYSTEMS: ROS    Positive for: Gastrointestinal, HENT, Eyes   Negative for: Constitutional, Neurological, Skin, Genitourinary, Musculoskeletal, Endocrine, Cardiovascular, Respiratory, Psychiatric, Allergic/Imm, Heme/Lymph   Last edited by Leonie Douglas, COA on 04/24/2020  1:50 PM. (History)       ALLERGIES Allergies  Allergen Reactions   Asa [Aspirin] Other (See Comments)    Contraindicated due to medical condition    PAST MEDICAL HISTORY Past Medical History:  Diagnosis Date   Anemia    GERD (gastroesophageal reflux disease)    Hearing loss in right ear    Unknown cause, had full work up with Specialist in [place]   History of chicken pox    Retinal detachment    Past Surgical History:  Procedure Laterality Date   ABDOMINAL EXPLORATION SURGERY     ABLATION     CATARACT EXTRACTION Right 11/23/2017   Dr. Herbert Deaner   CATARACT EXTRACTION Left 01/2020   Dr. Herbert Deaner   DENTAL SURGERY     EXPLORATORY LAPAROTOMY     "fibrous"   EYE SURGERY Right 11/23/2017  Cat Sx - Dr. Herbert Deaner   EYE SURGERY Left 01/2020   Cat Sx - Dr. Herbert Deaner   EYE SURGERY Right 08/09/2017   SBP+PPV - Dr. Bernarda Caffey   GAS INSERTION Right 08/09/2017   Procedure: INSERTION OF GAS;  Surgeon: Bernarda Caffey, MD;  Location: [facility];  Service: Ophthalmology;  Laterality: Right;   LASER PHOTO ABLATION Left 08/09/2017   Procedure: LASER PHOTO ABLATION;  Surgeon: Bernarda Caffey, MD;  Location: [facility];  Service: Ophthalmology;  Laterality: Left;   RETINAL DETACHMENT SURGERY Right 08/09/2017   SBP+PPV - Dr. Bernarda Caffey   SCLERAL BUCKLE WITH POSSIBLE 25 GAUGE PARS PLANA  VITRECTOMY Right 08/09/2017   Procedure: SCLERAL BUCKLE WITH 25 GAUGE PARS PLANA VITRECTOMY AND ENDOLASER;  Surgeon: Bernarda Caffey, MD;  Location: [facility];  Service: Ophthalmology;  Laterality: Right;   YAG LASER APPLICATION Right 36/64/4034    FAMILY HISTORY Family History  Problem Relation Age of Onset   Diabetes Mother    Prostate cancer Father    Hypertension Father    Retinal detachment Father    Lung cancer Paternal Grandfather    Amblyopia Neg Hx    Blindness Neg Hx    Cataracts Neg Hx    Glaucoma Neg Hx    Macular degeneration Neg Hx    Strabismus Neg Hx    Retinitis pigmentosa Neg Hx     SOCIAL HISTORY Social History   Tobacco Use   Smoking status: Never Smoker   Smokeless tobacco: Never Used  Vaping Use   Vaping Use: Never used  Substance Use Topics   Alcohol use: Yes    Alcohol/week: 0.0 standard drinks    Comment: occasional social use   Drug use: No         OPHTHALMIC EXAM:  Base Eye Exam    Visual Acuity (Snellen - Linear)      Right Left   Dist cc 20/20 20/20       Tonometry (Tonopen, 1:55 PM)      Right Left   Pressure 15 15       Pupils      Dark Light Shape React APD   Right 4 3 Round Brisk None   Left 4 3 Round Brisk None       Visual Fields (Counting fingers)      Left Right    Full Full       Extraocular Movement      Right Left    Full Full       Neuro/Psych    Oriented x3: Yes   Mood/Affect: Normal       Dilation    Both eyes: 1.0% Mydriacyl, 2.5% Phenylephrine @ 1:55 PM        Slit Lamp and Fundus Exam    External Exam      Right Left   External Normal        Slit Lamp Exam      Right Left   Lids/Lashes Mild Dermatochalasis - upper lid, mild Meibomian gland dysfunction Mild Dermatochalasis - upper lid, mild Meibomian gland dysfunction   Conjunctiva/Sclera White and quiet White and quiet   Cornea 1-2+ inferior Punctate epithelial erosions, Well healed cataract wound at 1030 Trace Punctate  epithelial erosions   Anterior Chamber Deep and quiet Deep and quiet   Iris Round and well dilated Round and well dilated   Lens PC IOL in good position; open PC PC IOL in good position   Vitreous Post vitrectomy; mild pigment  Vitreous syneresis, Posterior vitreous detachment, blood stained vitreous condensations, trace, fine pigment       Fundus Exam      Right Left   Disc Pink and Sharp, mildly Tilted disc, temporal PPA Pink and Sharp, mildly Tilted disc, mild temporal Peripapillary atrophy, Compact   C/D Ratio 0.3 0.4   Macula Flat, good foveal reflex, Epiretinal membrane with striae, mild RPE mottling, No heme or edema Flat, blunted foveal reflex, mild Retinal pigment epithelial mottling, No heme or edema   Vessels attenuated, mild tortuousity Vascular attenuation, Tortuous   Periphery Retina attached, good buckle height, good laser 360 on buckle and surrounding breaks, mild fibrosis over buckle at 10:30 -- stable from prior Attached, pigmented Lattice degeneration with atrophic holes at 0430; lattice at 1200 with atrophic break; good laser in place surrounding all lesions, no RT/RD on 360 scleral depression          IMAGING AND PROCEDURES  Imaging and Procedures for _0 @  OCT, Retina - OU - Both Eyes       Right Eye Quality was good. Central Foveal Thickness: 345. Progression has worsened. Findings include no SRF, epiretinal membrane, myopic contour, no IRF, normal foveal contour (Stable ERM, trace vitreous opacities).   Left Eye Quality was good. Central Foveal Thickness: 283. Progression has been stable. Findings include normal foveal contour, no IRF, no SRF, vitreomacular adhesion  (Interval release partial PVD, +vitreous opacities).   Notes *Images captured and stored on drive  Diagnosis / Impression:  OD: Stable ERM, trace vitreous opacities OS: NFP, no IRF/SRF; Interval release partial PVD, +vitreous opacities  Clinical management:  See below  Abbreviations:  NFP - Normal foveal profile. CME - cystoid macular edema. PED - pigment epithelial detachment. IRF - intraretinal fluid. SRF - subretinal fluid. EZ - ellipsoid zone. ERM - epiretinal membrane. ORA - outer retinal atrophy. ORT - outer retinal tubulation. SRHM - subretinal hyper-reflective material                  ASSESSMENT/PLAN:    ICD-10-CM   1. Posterior vitreous detachment of left eye  H43.812   2. RD (retinal detachment), right  H33.21   3. Epiretinal membrane (ERM) of right eye  H35.371   4. Retinal edema  H35.81 OCT, Retina - OU - Both Eyes  5. Lattice degeneration of both retinas  H35.413   6. 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 Pseudophakia of right eye  Z96.1   8. Pseudophakia of left eye  Z96.1     1. Acute PVD OS  **pt presents acutely today for persistent flashes of light and floaters OS x1 day**   - hemorrhagic PVD OS  - BCVA 20/20 OS  - flashes started around 5:00 and subsided around 8:15 this morning  - new, large floater appeared around noon today  - Discussed findings and prognosis  - No RT or RD on 360 scleral depressed exam  - Reviewed s/s of RT/RD  - Strict return precautions for any such RT/RD signs/symptoms  - f/u in 3-4 wks -- DFE/OCT  2. Rhegmatogenous retinal detachment, RIGHT eye  - bullous superotemporal mac on detachment, onset of foveal involvement Saturday, 01/30/17 by history  - detached from 1030-1200 oclock, large horseshoe tear from 1030-1100 and linear tear at 1130 within bed of lattice  - s/p SBP + PPV/EL/FAX/14% C3F8 OD, 06.10.19             - retina attached and in good  position -- good buckle height and laser around breaks  - BCVA 20/20 OD             - IOP okay today at 15  3,4.  Mild ERM OD  - persistent ERM w/ early pucker -- ?mild progression from prior  - asymptomatic, no metamorphopsia  - BCVA remains 20/20 OD  - no indication for surgery at this time  - monitor for now  - F/U in 9-12 months, sooner  prn -- DFE/OCT  5,6. Lattice degeneration OU;  w/ atrophic breaks left eye  - OD - superotemporal lattice at site of breaks and detachment  - OS - patches of lattice at 12 and 430 with a single atrophic breaks at each site  - s/p laser retinopexy OS at time of surgery   - monitor  7,8. Pseudophakia OU  - cataract OD progressively worsened following PPV  - s/p CE/IOL OD by expert surgeon Dr. Herbert Deaner on 09.24.19             - s/p CE/IOL OS by expert surgeon Dr. Herbert Deaner [phone]  - beautiful surgeries; new glasses on order  - BCVA 20/20 OU             - s/p yag cap OD (4.14.21) -- excellent opening  Ophthalmic Meds Ordered this visit:  No orders of the defined types were placed in this encounter.     Return for f/u 3-4 weeks, PVD OS, DFE, OCT.  There are no Patient Instructions on file for this visit.  Explained the diagnoses, plan, and follow up with the patient and they expressed understanding.  Patient expressed understanding of the importance of proper follow up care.   This document serves as a record of services personally performed by Gardiner Sleeper, MD, PhD. It was created on their behalf by San Jetty. Owens Shark, OA an ophthalmic technician. The creation of this record is the provider's dictation and/or activities during the visit.    Electronically signed by: San Jetty. Owens Shark, [place] 02.23.2022 4:36 PM  Gardiner Sleeper, M.D., Ph.D. Diseases & Surgery of the Retina and Vitreous [facility]  I have reviewed the above documentation for accuracy and completeness, and I agree with the above. Gardiner Sleeper, M.D., Ph.D. 04/24/20 4:36 PM  Abbreviations: M myopia (nearsighted); A astigmatism; H hyperopia (farsighted); P presbyopia; Mrx spectacle prescription;  CTL contact lenses; OD right eye; OS left eye; OU both eyes  XT exotropia; ET esotropia; PEK punctate epithelial keratitis; PEE punctate epithelial erosions; DES dry eye syndrome; MGD meibomian gland dysfunction;  ATs artificial tears; PFAT's preservative free artificial tears; [place] nuclear sclerotic cataract; PSC posterior subcapsular cataract; ERM epi-retinal membrane; PVD posterior vitreous detachment; RD retinal detachment; DM diabetes mellitus; DR diabetic retinopathy; NPDR non-proliferative diabetic retinopathy; PDR proliferative diabetic retinopathy; CSME clinically significant macular edema; DME diabetic macular edema; dbh dot blot hemorrhages; CWS cotton wool spot; POAG primary open angle glaucoma; C/D cup-to-disc ratio; HVF humphrey visual field; GVF goldmann visual field; OCT optical coherence tomography; IOP intraocular pressure; BRVO Branch retinal vein occlusion; CRVO central retinal vein occlusion; CRAO central retinal artery occlusion; BRAO branch retinal artery occlusion; RT retinal tear; SB scleral buckle; PPV pars plana vitrectomy; VH Vitreous hemorrhage; PRP panretinal laser photocoagulation; IVK intravitreal kenalog; VMT vitreomacular traction; MH Macular hole;  NVD neovascularization of the disc; NVE neovascularization elsewhere; AREDS age related eye disease study; ARMD age related macular degeneration; POAG primary open angle glaucoma; EBMD epithelial/anterior basement membrane  dystrophy; ACIOL anterior chamber intraocular lens; IOL intraocular lens; PCIOL posterior chamber intraocular lens; Phaco/IOL phacoemulsification with intraocular lens placement; [place] photorefractive keratectomy; LASIK laser assisted in situ keratomileusis; HTN hypertension; DM diabetes mellitus; COPD chronic obstructive pulmonary disease

## 2020-05-15 ENCOUNTER — Encounter (INDEPENDENT_AMBULATORY_CARE_PROVIDER_SITE_OTHER): Payer: Federal, State, Local not specified - PPO | Admitting: Ophthalmology

## 2020-05-21 ENCOUNTER — Telehealth: Payer: Self-pay

## 2020-05-21 NOTE — Telephone Encounter (Signed)
Patient calling to request name of physician that Dr. Claiborne Billings referred her to back in 2019.  I reviewed a couple of OV notes back in 2019 but could not find where Dr. Claiborne Billings had referred her to a speciality office.  Patient wasn't even sure of date. I told her I could get someone in clinical area to call her back and maybe they could ask questions so maybe it jog her memory.  CMA may find OV note also.  Please call patient [phone]

## 2020-05-21 NOTE — Progress Notes (Signed)
[facility] Note  05/22/2020     CHIEF COMPLAINT Patient presents for Retina Follow Up   HISTORY OF PRESENT ILLNESS: Gail Duncan is a 55 y.o. female who presents to the clinic today for:   HPI    Retina Follow Up    Patient presents with  Other.  In left eye.  This started 4 weeks ago.  I, the attending physician,  performed the HPI with the patient and updated documentation appropriately.          Comments    Patient here for 4 weeks retina follow up for PVD OS. Patient states vision doing good. No eye pain.        Last edited by Bernarda Caffey, MD on 05/22/2020  5:09 PM. (History)    pt reports interval improvement in floaters. Just intermittently sees a "gel" in vision. No photopsias   Referring physician: Howard Pouch A, DO [address] [place],  [place] [address]  HISTORICAL INFORMATION:   Selected notes from the MEDICAL RECORD NUMBER Referred by ED for possible RD   CURRENT MEDICATIONS: Current Outpatient Medications (Ophthalmic Drugs)  Medication Sig  . dorzolamide-timolol (COSOPT) 22.3-6.8 MG/ML ophthalmic solution Place 1 drop into the right eye 2 (two) times daily. (Patient not taking: No sig reported)   No current facility-administered medications for this visit. (Ophthalmic Drugs)   Current Outpatient Medications (Other)  Medication Sig  . Ascorbic Acid (VITAMIN C) 1000 MG tablet Take 1,000 mg by mouth daily.  . chlorpheniramine (CHLOR-TRIMETON) 4 MG tablet Take 4 mg by mouth 2 (two) times daily as needed for allergies.  . Cholecalciferol (VITAMIN D3) 5000 units CAPS Take 5,000 capsules by mouth 3 (three) times a week.   . Coenzyme Q10-Vitamin E (COQ10-VITAMIN E) 100-10 MG-UNIT CAPS Take 1 capsule by mouth daily.   . cyclobenzaprine (FLEXERIL) 5 MG tablet Take 1 tablet (5 mg total) by mouth at bedtime.  . Magnesium Citrate (CITROMA PO) magnesium citrate  . pantoprazole (PROTONIX) 20 MG tablet Take 20 mg by mouth daily.  .  pantoprazole (PROTONIX) 40 MG tablet Take 40 mg by mouth daily.  . predniSONE (DELTASONE) 20 MG tablet 60 mg x3d, 40 mg x3d, 20 mg x2d, 10 mg x2d (Patient not taking: Reported on 04/24/2020)   No current facility-administered medications for this visit. (Other)      REVIEW OF SYSTEMS: ROS    Positive for: Gastrointestinal, HENT, Eyes   Negative for: Constitutional, Neurological, Skin, Genitourinary, Musculoskeletal, Endocrine, Cardiovascular, Respiratory, Psychiatric, Allergic/Imm, Heme/Lymph   Last edited by Theodore Demark, COA on 05/22/2020  3:06 PM. (History)       ALLERGIES Allergies  Allergen Reactions  . Asa [Aspirin] Other (See Comments)    Contraindicated due to medical condition    PAST MEDICAL HISTORY Past Medical History:  Diagnosis Date  . Anemia   . GERD (gastroesophageal reflux disease)   . Hearing loss in right ear    Unknown cause, had full work up with Specialist in [place]  . History of chicken pox   . Retinal detachment    Past Surgical History:  Procedure Laterality Date  . ABDOMINAL EXPLORATION SURGERY    . ABLATION    . CATARACT EXTRACTION Right 11/23/2017   Dr. Herbert Deaner  . CATARACT EXTRACTION Left 01/2020   Dr. Herbert Deaner  . DENTAL SURGERY    . EXPLORATORY LAPAROTOMY     "fibrous"  . EYE SURGERY Right 11/23/2017   Cat Sx - Dr.  Hecker  . EYE SURGERY Left 01/2020   Cat Sx - Dr. Herbert Deaner  . EYE SURGERY Right 08/09/2017   SBP+PPV - Dr. Bernarda Caffey  . GAS INSERTION Right 08/09/2017   Procedure: INSERTION OF GAS;  Surgeon: Bernarda Caffey, MD;  Location: [facility];  Service: Ophthalmology;  Laterality: Right;  . LASER PHOTO ABLATION Left 08/09/2017   Procedure: LASER PHOTO ABLATION;  Surgeon: Bernarda Caffey, MD;  Location: [facility];  Service: Ophthalmology;  Laterality: Left;  . RETINAL DETACHMENT SURGERY Right 08/09/2017   SBP+PPV - Dr. Bernarda Caffey  . SCLERAL BUCKLE WITH POSSIBLE 25 GAUGE PARS PLANA VITRECTOMY Right 08/09/2017   Procedure: SCLERAL BUCKLE WITH 25  GAUGE PARS PLANA VITRECTOMY AND ENDOLASER;  Surgeon: Bernarda Caffey, MD;  Location: [facility] [facility];  Service: Ophthalmology;  Laterality: Right;  . YAG LASER APPLICATION Right 29/79/8921    FAMILY HISTORY Family History  Problem Relation Age of Onset  . Diabetes Mother   . Prostate cancer Father   . Hypertension Father   . Retinal detachment Father   . Lung cancer Paternal Grandfather   . Amblyopia Neg Hx   . Blindness Neg Hx   . Cataracts Neg Hx   . Glaucoma Neg Hx   . Macular degeneration Neg Hx   . Strabismus Neg Hx   . Retinitis pigmentosa Neg Hx     SOCIAL HISTORY Social History   Tobacco Use  . Smoking status: Never Smoker  . Smokeless tobacco: Never Used  Vaping Use  . Vaping Use: Never used  Substance Use Topics  . Alcohol use: Yes    Alcohol/week: 0.0 standard drinks    Comment: occasional social use  . Drug use: No         OPHTHALMIC EXAM:  Base Eye Exam    Visual Acuity (Snellen - Linear)      Right Left   Dist cc 20/20 20/20   Correction: Glasses       Tonometry (Tonopen, 3:04 PM)      Right Left   Pressure 14 14       Pupils      Dark Light Shape React APD   Right 4 3 Round Brisk None   Left 4 3 Round Brisk None       Visual Fields (Counting fingers)      Left Right    Full Full       Extraocular Movement      Right Left    Full Full       Neuro/Psych    Oriented x3: Yes   Mood/Affect: Normal       Dilation    Both eyes: 1.0% Mydriacyl, 2.5% Phenylephrine @ 3:04 PM        Slit Lamp and Fundus Exam    External Exam      Right Left   External Normal        Slit Lamp Exam      Right Left   Lids/Lashes Mild Dermatochalasis - upper lid, mild Meibomian gland dysfunction Mild Dermatochalasis - upper lid, mild Meibomian gland dysfunction   Conjunctiva/Sclera White and quiet White and quiet   Cornea 1-2+ inferior Punctate epithelial erosions, Well healed cataract wound at 1030 Trace Punctate epithelial erosions   Anterior Chamber  Deep and quiet Deep and quiet   Iris Round and well dilated Round and well dilated   Lens PC IOL in good position; open PC PC IOL in good position   Vitreous Post vitrectomy; mild pigment  Vitreous syneresis, Posterior vitreous detachment, blood stained vitreous condensations, trace, fine pigment       Fundus Exam      Right Left   Disc Pink and Sharp, mildly Tilted disc, temporal PPA Pink and Sharp, mildly Tilted disc, mild temporal Peripapillary atrophy, Compact   C/D Ratio 0.3 0.4   Macula Flat, good foveal reflex, Epiretinal membrane with striae, mild RPE mottling, No heme or edema Flat, blunted foveal reflex, mild Retinal pigment epithelial mottling, No heme or edema   Vessels attenuated, mild tortuousity Vascular attenuation, Tortuous   Periphery Retina attached, good buckle height, good laser 360 on buckle and surrounding breaks, mild fibrosis over buckle at 10:30 -- stable from prior Attached, pigmented Lattice degeneration with atrophic holes at 0430; lattice at 1200 with atrophic break; good laser in place surrounding all lesions, no RT/RD on 360 scleral depression        Refraction    Wearing Rx      Sphere Cylinder Axis   Right -1.00 +0.50 180   Left -3.50 Sphere    Type: SVL          IMAGING AND PROCEDURES  Imaging and Procedures for _0 @  OCT, Retina - OU - Both Eyes       Right Eye Quality was good. Central Foveal Thickness: 344. Progression has been stable. Findings include no SRF, epiretinal membrane, myopic contour, no IRF, normal foveal contour (Stable ERM, trace vitreous opacities).   Left Eye Quality was good. Central Foveal Thickness: 286. Progression has improved. Findings include normal foveal contour, no IRF, no SRF, vitreomacular adhesion  (stable release of PVD, interval improvement in vitreous opacities).   Notes *Images captured and stored on drive  Diagnosis / Impression:  OD: Stable ERM, trace vitreous opacities OS: NFP, no IRF/SRF; stable  release of PVD, interval improvement in vitreous opacities  Clinical management:  See below  Abbreviations: NFP - Normal foveal profile. CME - cystoid macular edema. PED - pigment epithelial detachment. IRF - intraretinal fluid. SRF - subretinal fluid. EZ - ellipsoid zone. ERM - epiretinal membrane. ORA - outer retinal atrophy. ORT - outer retinal tubulation. SRHM - subretinal hyper-reflective material                  ASSESSMENT/PLAN:    ICD-10-CM   1. Posterior vitreous detachment of left eye  H43.812   2. RD (retinal detachment), right  H33.21   3. Epiretinal membrane (ERM) of right eye  H35.371   4. Retinal edema  H35.81 OCT, Retina - OU - Both Eyes  5. Lattice degeneration of both retinas  H35.413   6. 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 Pseudophakia of right eye  Z96.1   8. Pseudophakia of left eye  Z96.1     1. Sub-acute PVD OS  **pt presented acutely on 2.23.22 for persistent flashes of light and floaters OS x1 day**   - hemorrhagic PVD OS  - today symptoms improved -- decreased floaters, no photopsias  - BCVA 20/20 OS -- stable  - Discussed findings and prognosis  - No RT or RD on 360 scleral depressed exam  - Reviewed s/s of RT/RD  - Strict return precautions for any such RT/RD signs/symptoms  - f/u as scheduled in Sept, sooner prn -- DFE/OCT  2. Rhegmatogenous retinal detachment, RIGHT eye  - bullous superotemporal mac on detachment, onset of foveal involvement Saturday, 01/30/17 by history  - detached from 1030-1200 oclock, large horseshoe tear  from 1030-1100 and linear tear at 1130 within bed of lattice  - s/p SBP + PPV/EL/FAX/14% C3F8 OD, 06.10.19             - retina attached and in good position -- good buckle height and laser around breaks  - BCVA 20/20 OD             - IOP OK today at  3,4.  Mild ERM OD  - persistent ERM w/ early pucker -- stable  - asymptomatic, no metamorphopsia  - BCVA remains 20/20 OD  - no indication  for surgery at this time  - monitor for now  - f/u in 9-12 months, sooner prn, DFE, OCT  5,6. Lattice degeneration OU;  w/ atrophic breaks left eye  - OD - superotemporal lattice at site of breaks and detachment  - OS - patches of lattice at 12 and 430 with a single atrophic breaks at each site  - s/p laser retinopexy OS at time of surgery   - good laser in place  - no new RT/RD or lattice OU  - monitor  7,8. Pseudophakia OU  - cataract OD progressively worsened following PPV  - s/p CE/IOL OD by expert surgeon Dr. Herbert Deaner on 09.24.19             - s/p CE/IOL OS by expert surgeon Dr. Herbert Deaner [phone]  - beautiful surgeries; new glasses on order  - BCVA 20/20 OU             - s/p yag cap OD (04.14.21)--excellent opening  Ophthalmic Meds Ordered this visit:  No orders of the defined types were placed in this encounter.     Return for as scheduled in Sept.  There are no Patient Instructions on file for this visit.  Explained the diagnoses, plan, and follow up with the patient and they expressed understanding.  Patient expressed understanding of the importance of proper follow up care.   This document serves as a record of services personally performed by Gardiner Sleeper, MD, PhD. It was created on their behalf by Roselee Nova, COMT. The creation of this record is the provider's dictation and/or activities during the visit.  Electronically signed by: Roselee Nova, COMT 05/22/20 5:12 PM  Gardiner Sleeper, M.D., Ph.D. Diseases & Surgery of the Retina and Vitreous [facility]  I have reviewed the above documentation for accuracy and completeness, and I agree with the above. Gardiner Sleeper, M.D., Ph.D. 05/22/20 5:12 PM   Abbreviations: M myopia (nearsighted); A astigmatism; H hyperopia (farsighted); P presbyopia; Mrx spectacle prescription;  CTL contact lenses; OD right eye; OS left eye; OU both eyes  XT exotropia; ET esotropia; PEK punctate epithelial keratitis;  PEE punctate epithelial erosions; DES dry eye syndrome; MGD meibomian gland dysfunction; ATs artificial tears; PFAT's preservative free artificial tears; [place] nuclear sclerotic cataract; PSC posterior subcapsular cataract; ERM epi-retinal membrane; PVD posterior vitreous detachment; RD retinal detachment; DM diabetes mellitus; DR diabetic retinopathy; NPDR non-proliferative diabetic retinopathy; PDR proliferative diabetic retinopathy; CSME clinically significant macular edema; DME diabetic macular edema; dbh dot blot hemorrhages; CWS cotton wool spot; POAG primary open angle glaucoma; C/D cup-to-disc ratio; HVF humphrey visual field; GVF goldmann visual field; OCT optical coherence tomography; IOP intraocular pressure; BRVO Branch retinal vein occlusion; CRVO central retinal vein occlusion; CRAO central retinal artery occlusion; BRAO branch retinal artery occlusion; RT retinal tear; SB scleral buckle; PPV pars plana vitrectomy; VH Vitreous hemorrhage; PRP panretinal laser photocoagulation; IVK  intravitreal kenalog; VMT vitreomacular traction; MH Macular hole;  NVD neovascularization of the disc; NVE neovascularization elsewhere; AREDS age related eye disease study; ARMD age related macular degeneration; POAG primary open angle glaucoma; EBMD epithelial/anterior basement membrane dystrophy; ACIOL anterior chamber intraocular lens; IOL intraocular lens; PCIOL posterior chamber intraocular lens; Phaco/IOL phacoemulsification with intraocular lens placement; [place] photorefractive keratectomy; LASIK laser assisted in situ keratomileusis; HTN hypertension; DM diabetes mellitus; COPD chronic obstructive pulmonary disease

## 2020-05-21 NOTE — Telephone Encounter (Signed)
LVM for pt to CB regarding referral request.  ?

## 2020-05-22 ENCOUNTER — Ambulatory Visit (INDEPENDENT_AMBULATORY_CARE_PROVIDER_SITE_OTHER): Payer: Federal, State, Local not specified - PPO | Admitting: Ophthalmology

## 2020-05-22 ENCOUNTER — Encounter (INDEPENDENT_AMBULATORY_CARE_PROVIDER_SITE_OTHER): Payer: Self-pay | Admitting: Ophthalmology

## 2020-05-22 ENCOUNTER — Other Ambulatory Visit: Payer: Self-pay

## 2020-05-22 DIAGNOSIS — H3321 Serous retinal detachment, right eye: Secondary | ICD-10-CM | POA: Diagnosis not present

## 2020-05-22 DIAGNOSIS — H43812 Vitreous degeneration, left eye: Secondary | ICD-10-CM | POA: Diagnosis not present

## 2020-05-22 DIAGNOSIS — H3581 Retinal edema: Secondary | ICD-10-CM | POA: Diagnosis not present

## 2020-05-22 DIAGNOSIS — Z961 Presence of intraocular lens: Secondary | ICD-10-CM

## 2020-05-22 DIAGNOSIS — H35371 Puckering of macula, right eye: Secondary | ICD-10-CM

## 2020-05-22 DIAGNOSIS — H35413 Lattice degeneration of retina, bilateral: Secondary | ICD-10-CM

## 2020-05-22 DIAGNOSIS — H33332 Multiple defects of retina without detachment, left eye: Secondary | ICD-10-CM

## 2020-05-22 NOTE — Telephone Encounter (Signed)
Spoke with pt and informed her of GS that was referred to in 2020 for cellulitis.

## 2020-06-10 ENCOUNTER — Ambulatory Visit: Payer: Self-pay | Admitting: General Surgery

## 2020-06-10 DIAGNOSIS — K603 Anal fistula: Secondary | ICD-10-CM | POA: Diagnosis not present

## 2020-06-10 NOTE — H&P (Signed)
The patient is a 55 year old female who presents with a subcutaneous abscess. Pt presented to urgent office on 08/19/18 for an open wound on her left buttock. She underwent I&D of this area on 09/09/18. She states after this, the wound healed slowly, but she continues to have some issues with a portion not completely healing. 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 We discussed surgery, them, but she decided to wait on this. She is here today to be reevaluated and discuss this further. She reports occasional purulent drainage.    Problem List/Past Medical Romie Levee, MD; 06/10/2020 11:22 AM) ABSCESS (L02.91) ANAL FISTULA (K60.3)  Past Surgical History Romie Levee, MD; 06/10/2020 11:22 AM) Cataract Surgery Right. Oral Surgery  Diagnostic Studies History Romie Levee, MD; 06/10/2020 11:22 AM) Colonoscopy 1-5 years ago Mammogram 1-3 years ago Pap Smear 1-5 years ago  Allergies Arbutus Ped, CMA; 06/10/2020 11:00 AM) Aspirin *ANALGESICS - NonNarcotic* Allergies Reconciled  Medication History Arbutus Ped, CMA; 06/10/2020 11:00 AM) Vitamin D (400UNIT Capsule, 1 (one) Oral) Active. Iron (18MG  Tablet ER, Oral) Active. Magnesium (30MG  Tablet, Oral) Active. Pantoprazole Sodium (20MG  Tablet DR, Oral) Active. Mupirocin (2% Ointment, External) Active. Pantoprazole Sodium (40MG  Tablet DR, Oral) Active. Mupirocin Calcium (2% Cream, External) Active. Medications Reconciled  Social History , MD; 06/10/2020 11:22 AM) Alcohol use Occasional alcohol use. Caffeine use Coffee. No drug use Tobacco use Never smoker.  Family History , MD; 06/10/2020 11:22 AM) Diabetes Mellitus Mother. Hypertension Father.  Pregnancy / Birth History Romie Levee, MD; 06/10/2020 11:22 AM) Age at menarche 14 years. Age of menopause 25-50 Gravida 2 Length (months) of breastfeeding 7-12 Maternal age  52-30 Para 1  Other Problems Romie Levee, MD; 06/10/2020 11:22 AM) Gastroesophageal Reflux Disease Hemorrhoids     Review of Systems 50-56 MD; 06/10/2020 11:22 AM) General Not Present- Appetite Loss, Chills, Fatigue, Fever, Night Sweats, Weight Gain and Weight Loss. Skin Present- New Lesions. Not Present- Change in Wart/Mole, Dryness, Hives, Jaundice, Non-Healing Wounds, Rash and Ulcer. HEENT Present- Wears glasses/contact lenses. Not Present- Earache, Hearing Loss, Hoarseness, Nose Bleed, Oral Ulcers, Ringing in the Ears, Seasonal Allergies, Sinus Pain, Sore Throat, Visual Disturbances and Yellow Eyes. Respiratory Not Present- Bloody sputum, Chronic Cough, Difficulty Breathing, Snoring and Wheezing. Breast Not Present- Breast Mass, Breast Pain, Nipple Discharge and Skin Changes. Cardiovascular Not Present- Chest Pain, Difficulty Breathing Lying Down, Leg Cramps, Palpitations, Rapid Heart Rate, Shortness of Breath and Swelling of Extremities. Gastrointestinal Not Present- Abdominal Pain, Bloating, Bloody Stool, Change in Bowel Habits, Chronic diarrhea, Constipation, Difficulty Swallowing, Excessive gas, Gets full quickly at meals, Hemorrhoids, Indigestion, Nausea, Rectal Pain and Vomiting. Female Genitourinary Not Present- Frequency, Nocturia, Painful Urination, Pelvic Pain and Urgency. Musculoskeletal Not Present- Back Pain, Joint Pain, Joint Stiffness, Muscle Pain, Muscle Weakness and Swelling of Extremities. Neurological Not Present- Decreased Memory, Fainting, Headaches, Numbness, Seizures, Tingling, Tremor, Trouble walking and Weakness. Psychiatric Not Present- Anxiety, Bipolar, Change in Sleep Pattern, Depression, Fearful and Frequent crying. Endocrine Not Present- Cold Intolerance, Excessive Hunger, Hair Changes, Heat Intolerance, Hot flashes and New Diabetes. Hematology Present- Easy Bruising and Persistent Infections. Not Present- Blood Thinners, Excessive bleeding, Gland  problems and HIV.   Physical Exam Romie Levee MD; 06/10/2020 11:22 AM)  General Mental Status-Alert. General Appearance-Cooperative.  Rectal Note: Left lateral region: There is a 1 cm x 0.5 cm raised lesion in the left lateral perianal area with purulent drainage concerning for external fistula opening.  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 She tends towards more constipation for her bowel habits. We discussed exam under anesthesia with fistulotomy versus seton. We discussed the risk of fistulotomy, which include equal incontinence. We discussed that if more than 20% of her sphincter complex was involved, we would proceed with seton placement and subsequent LIF T procedure. We discussed the typical healing times and need to be off of work. We will plan on her being out of work for approximately 1 week after surgery.

## 2020-08-09 ENCOUNTER — Other Ambulatory Visit: Payer: Self-pay

## 2020-08-09 ENCOUNTER — Encounter (HOSPITAL_BASED_OUTPATIENT_CLINIC_OR_DEPARTMENT_OTHER): Payer: Self-pay | Admitting: General Surgery

## 2020-08-09 DIAGNOSIS — K603 Anal fistula, unspecified: Secondary | ICD-10-CM

## 2020-08-09 HISTORY — DX: Anal fistula, unspecified: K60.30

## 2020-08-09 HISTORY — DX: Anal fistula: K60.3

## 2020-08-09 NOTE — Progress Notes (Signed)
Spoke w/ via phone for pre-op interview---pt Lab needs dos----   none            Lab results------none COVID test -----patient states asymptomatic no test needed Arrive at -------530 am 08-15-2020 NPO after MN NO Solid Food.  Clear liquids from MN until---430 am then npo Med rec completed Medications to take morning of surgery -----none Diabetic medication -----n/a Patient instructed no nail polish to be worn day of surgery Patient instructed to bring photo id and insurance card day of surgery Patient aware to have Driver (ride ) / caregiver   spouse Gail Duncan will stay  for 24 hours after surgery  Patient Special Instructions -----none Pre-Op special Istructions -----none Patient verbalized understanding of instructions that were given at this phone interview. Patient denies shortness of breath, chest pain, fever, cough at this phone interview.

## 2020-08-12 ENCOUNTER — Other Ambulatory Visit (HOSPITAL_COMMUNITY): Payer: Federal, State, Local not specified - PPO

## 2020-08-14 NOTE — Anesthesia Preprocedure Evaluation (Addendum)
Anesthesia Evaluation  Patient identified by MRN, date of birth, ID band Patient awake    Reviewed: Allergy & Precautions, NPO status , Patient's Chart, lab work & pertinent test results  History of Anesthesia Complications Negative for: history of anesthetic complications  Airway Mallampati: II  TM Distance: >3 FB Neck ROM: Full    Dental no notable dental hx. (+) Dental Advisory Given   Pulmonary neg pulmonary ROS,    Pulmonary exam normal        Cardiovascular negative cardio ROS Normal cardiovascular exam     Neuro/Psych negative neurological ROS  negative psych ROS   GI/Hepatic Neg liver ROS, GERD  Controlled,  Endo/Other  negative endocrine ROS  Renal/GU negative Renal ROS  negative genitourinary   Musculoskeletal negative musculoskeletal ROS (+)   Abdominal   Peds negative pediatric ROS (+)  Hematology negative hematology ROS (+)   Anesthesia Other Findings   Reproductive/Obstetrics negative OB ROS                            Anesthesia Physical  Anesthesia Plan  ASA: 2  Anesthesia Plan: MAC   Post-op Pain Management:    Induction: Intravenous  PONV Risk Score and Plan: 2 and Ondansetron, Dexamethasone and Propofol infusion  Airway Management Planned: Natural Airway, Simple Face Mask and Nasal Cannula  Additional Equipment:   Intra-op Plan:   Post-operative Plan:   Informed Consent: I have reviewed the patients History and Physical, chart, labs and discussed the procedure including the risks, benefits and alternatives for the proposed anesthesia with the patient or authorized representative who has indicated his/her understanding and acceptance.     Dental advisory given  Plan Discussed with: Anesthesiologist and CRNA  Anesthesia Plan Comments:        Anesthesia Quick Evaluation

## 2020-08-15 ENCOUNTER — Encounter (HOSPITAL_BASED_OUTPATIENT_CLINIC_OR_DEPARTMENT_OTHER)
Admission: RE | Disposition: A | Discharge status: Home / Self Care | Payer: Self-pay | Source: Home / Self Care | Attending: General Surgery

## 2020-08-15 ENCOUNTER — Ambulatory Visit (HOSPITAL_BASED_OUTPATIENT_CLINIC_OR_DEPARTMENT_OTHER): Payer: Federal, State, Local not specified - PPO | Admitting: Anesthesiology

## 2020-08-15 ENCOUNTER — Other Ambulatory Visit: Payer: Self-pay

## 2020-08-15 ENCOUNTER — Encounter (HOSPITAL_BASED_OUTPATIENT_CLINIC_OR_DEPARTMENT_OTHER): Payer: Self-pay | Admitting: General Surgery

## 2020-08-15 ENCOUNTER — Ambulatory Visit (HOSPITAL_BASED_OUTPATIENT_CLINIC_OR_DEPARTMENT_OTHER)
Admission: RE | Admit: 2020-08-15 | Discharge: 2020-08-15 | Disposition: A | Discharge status: Home / Self Care | Payer: Federal, State, Local not specified - PPO | Attending: General Surgery | Admitting: General Surgery

## 2020-08-15 DIAGNOSIS — K219 Gastro-esophageal reflux disease without esophagitis: Secondary | ICD-10-CM | POA: Diagnosis not present

## 2020-08-15 DIAGNOSIS — K603 Anal fistula: Secondary | ICD-10-CM | POA: Insufficient documentation

## 2020-08-15 DIAGNOSIS — Z79899 Other long term (current) drug therapy: Secondary | ICD-10-CM | POA: Diagnosis not present

## 2020-08-15 DIAGNOSIS — R590 Localized enlarged lymph nodes: Secondary | ICD-10-CM | POA: Diagnosis not present

## 2020-08-15 DIAGNOSIS — E611 Iron deficiency: Secondary | ICD-10-CM | POA: Diagnosis not present

## 2020-08-15 HISTORY — PX: RECTAL EXAM UNDER ANESTHESIA: SHX6399

## 2020-08-15 HISTORY — DX: Presence of spectacles and contact lenses: Z97.3

## 2020-08-15 HISTORY — PX: ANAL FISTULOTOMY: SHX6423

## 2020-08-15 HISTORY — DX: Pneumonia, unspecified organism: J18.9

## 2020-08-15 SURGERY — ANAL FISTULOTOMY
Anesthesia: Monitor Anesthesia Care | Site: Anus

## 2020-08-15 MED ORDER — FENTANYL CITRATE (PF) 100 MCG/2ML IJ SOLN
INTRAMUSCULAR | Status: AC
  Filled 2020-08-15: qty 2

## 2020-08-15 MED ORDER — LIDOCAINE 5 % EX OINT
TOPICAL_OINTMENT | CUTANEOUS | Status: DC | PRN
  Administered 2020-08-15: 1 via TOPICAL

## 2020-08-15 MED ORDER — SODIUM CHLORIDE 0.9% FLUSH: 3.0000 mL | Freq: Two times a day (BID) | INTRAVENOUS | Status: DC

## 2020-08-15 MED ORDER — PROMETHAZINE HCL 25 MG/ML IJ SOLN: 6.2500 mg | INTRAMUSCULAR | Status: DC | PRN

## 2020-08-15 MED ORDER — KETOROLAC TROMETHAMINE 30 MG/ML IJ SOLN
INTRAMUSCULAR | Status: AC
  Filled 2020-08-15: qty 1

## 2020-08-15 MED ORDER — PROPOFOL 10 MG/ML IV BOLUS
INTRAVENOUS | Status: DC | PRN
  Administered 2020-08-15: 25 mg via INTRAVENOUS
  Administered 2020-08-15: 30 mg via INTRAVENOUS

## 2020-08-15 MED ORDER — ACETAMINOPHEN 325 MG RE SUPP: 650.0000 mg | RECTAL | Status: DC | PRN

## 2020-08-15 MED ORDER — ACETAMINOPHEN 325 MG PO TABS: 650.0000 mg | ORAL_TABLET | ORAL | Status: DC | PRN

## 2020-08-15 MED ORDER — ACETAMINOPHEN 500 MG PO TABS
ORAL_TABLET | ORAL | Status: AC
  Filled 2020-08-15: qty 2

## 2020-08-15 MED ORDER — PROPOFOL 500 MG/50ML IV EMUL
INTRAVENOUS | Status: DC | PRN
  Administered 2020-08-15: 100 ug/kg/min via INTRAVENOUS

## 2020-08-15 MED ORDER — KETOROLAC TROMETHAMINE 30 MG/ML IJ SOLN
INTRAMUSCULAR | Status: DC | PRN
  Administered 2020-08-15: 30 mg via INTRAVENOUS

## 2020-08-15 MED ORDER — BUPIVACAINE-EPINEPHRINE 0.5% -1:200000 IJ SOLN
INTRAMUSCULAR | Status: DC | PRN
  Administered 2020-08-15: 60 mL

## 2020-08-15 MED ORDER — LIDOCAINE HCL (PF) 2 % IJ SOLN
INTRAMUSCULAR | Status: AC
  Filled 2020-08-15: qty 5

## 2020-08-15 MED ORDER — ONDANSETRON HCL 4 MG/2ML IJ SOLN
INTRAMUSCULAR | Status: DC | PRN
  Administered 2020-08-15: 4 mg via INTRAVENOUS

## 2020-08-15 MED ORDER — DEXAMETHASONE SODIUM PHOSPHATE 10 MG/ML IJ SOLN
INTRAMUSCULAR | Status: DC | PRN
  Administered 2020-08-15: 8 mg via INTRAVENOUS

## 2020-08-15 MED ORDER — OXYCODONE HCL 5 MG PO TABS: 5.0000 mg | ORAL_TABLET | ORAL | Status: DC | PRN

## 2020-08-15 MED ORDER — FENTANYL CITRATE (PF) 250 MCG/5ML IJ SOLN
INTRAMUSCULAR | Status: DC | PRN
  Administered 2020-08-15: 25 ug via INTRAVENOUS

## 2020-08-15 MED ORDER — TRAMADOL HCL 50 MG PO TABS: 50.0000 mg | ORAL_TABLET | Freq: Four times a day (QID) | ORAL | 0 refills | Status: DC | PRN

## 2020-08-15 MED ORDER — AMISULPRIDE (ANTIEMETIC) 5 MG/2ML IV SOLN: 10.0000 mg | Freq: Once | INTRAVENOUS | Status: DC | PRN

## 2020-08-15 MED ORDER — ACETAMINOPHEN 500 MG PO TABS
1000.0000 mg | ORAL_TABLET | ORAL | Status: AC
  Administered 2020-08-15: 1000 mg via ORAL

## 2020-08-15 MED ORDER — LIDOCAINE 2% (20 MG/ML) 5 ML SYRINGE
INTRAMUSCULAR | Status: DC | PRN
  Administered 2020-08-15: 100 mg via INTRAVENOUS

## 2020-08-15 MED ORDER — FENTANYL CITRATE (PF) 100 MCG/2ML IJ SOLN: 25.0000 ug | INTRAMUSCULAR | Status: DC | PRN

## 2020-08-15 MED ORDER — LACTATED RINGERS IV SOLN
INTRAVENOUS | Status: DC
  Administered 2020-08-15: 1000 mL via INTRAVENOUS

## 2020-08-15 MED ORDER — PROPOFOL 500 MG/50ML IV EMUL
INTRAVENOUS | Status: AC
  Filled 2020-08-15: qty 50

## 2020-08-15 MED ORDER — SODIUM CHLORIDE 0.9% FLUSH: 3.0000 mL | INTRAVENOUS | Status: DC | PRN

## 2020-08-15 MED ORDER — MIDAZOLAM HCL 2 MG/2ML IJ SOLN
INTRAMUSCULAR | Status: AC
  Filled 2020-08-15: qty 4

## 2020-08-15 MED ORDER — VITAMIN D3 125 MCG (5000 UT) PO CAPS: 1.0000 | ORAL_CAPSULE | ORAL | Status: AC

## 2020-08-15 MED ORDER — SODIUM CHLORIDE 0.9 % IV SOLN: 250.0000 mL | INTRAVENOUS | Status: DC | PRN

## 2020-08-15 MED ORDER — MIDAZOLAM HCL 5 MG/5ML IJ SOLN
INTRAMUSCULAR | Status: DC | PRN
  Administered 2020-08-15 (×2): 2 mg via INTRAVENOUS

## 2020-08-15 SURGICAL SUPPLY — 48 items
APL SKNCLS STERI-STRIP NONHPOA (GAUZE/BANDAGES/DRESSINGS) ×2
BENZOIN TINCTURE PRP APPL 2/3 (GAUZE/BANDAGES/DRESSINGS) ×4 IMPLANT
BLADE EXTENDED COATED 6.5IN (ELECTRODE) IMPLANT
BLADE HEX COATED 2.75 (ELECTRODE) ×2 IMPLANT
BLADE SURG 10 STRL SS (BLADE) IMPLANT
BRIEF STRETCH FOR OB PAD LRG (UNDERPADS AND DIAPERS) ×2 IMPLANT
COVER BACK TABLE 60X90IN (DRAPES) ×2 IMPLANT
COVER MAYO STAND STRL (DRAPES) ×2 IMPLANT
COVER WAND RF STERILE (DRAPES) ×2 IMPLANT
DECANTER SPIKE VIAL GLASS SM (MISCELLANEOUS) ×2 IMPLANT
DRAPE LAPAROTOMY 100X72 PEDS (DRAPES) ×2 IMPLANT
DRAPE UTILITY XL STRL (DRAPES) ×2 IMPLANT
DRSG PAD ABDOMINAL 8X10 ST (GAUZE/BANDAGES/DRESSINGS) ×2 IMPLANT
ELECT REM PT RETURN 9FT ADLT (ELECTROSURGICAL) ×2
ELECTRODE REM PT RTRN 9FT ADLT (ELECTROSURGICAL) ×1 IMPLANT
GAUZE SPONGE 4X4 12PLY STRL (GAUZE/BANDAGES/DRESSINGS) ×2 IMPLANT
GAUZE SPONGE 4X4 12PLY STRL LF (GAUZE/BANDAGES/DRESSINGS) ×2 IMPLANT
GLOVE SURG ENC MOIS LTX SZ6.5 (GLOVE) ×2 IMPLANT
GLOVE SURG UNDER POLY LF SZ7 (GLOVE) ×2 IMPLANT
GOWN STRL REUS W/TWL XL LVL3 (GOWN DISPOSABLE) ×2 IMPLANT
HYDROGEN PEROXIDE 16OZ (MISCELLANEOUS) ×2 IMPLANT
IV CATH 14GX2 1/4 (CATHETERS) ×2 IMPLANT
IV CATH 18G SAFETY (IV SOLUTION) ×2 IMPLANT
KIT SIGMOIDOSCOPE (SET/KITS/TRAYS/PACK) IMPLANT
KIT TURNOVER CYSTO (KITS) ×2 IMPLANT
LOOP VESSEL MAXI BLUE (MISCELLANEOUS) IMPLANT
NEEDLE HYPO 22GX1.5 SAFETY (NEEDLE) ×2 IMPLANT
NS IRRIG 500ML POUR BTL (IV SOLUTION) ×2 IMPLANT
PACK BASIN DAY SURGERY FS (CUSTOM PROCEDURE TRAY) ×2 IMPLANT
PAD ARMBOARD 7.5X6 YLW CONV (MISCELLANEOUS) IMPLANT
PENCIL SMOKE EVACUATOR (MISCELLANEOUS) ×2 IMPLANT
SPONGE HEMORRHOID 8X3CM (HEMOSTASIS) IMPLANT
SPONGE SURGIFOAM ABS GEL 12-7 (HEMOSTASIS) IMPLANT
SUCTION FRAZIER HANDLE 10FR (MISCELLANEOUS)
SUCTION TUBE FRAZIER 10FR DISP (MISCELLANEOUS) IMPLANT
SUT CHROMIC 2 0 SH (SUTURE) ×2 IMPLANT
SUT CHROMIC 3 0 SH 27 (SUTURE) IMPLANT
SUT ETHIBOND 0 (SUTURE) IMPLANT
SUT VIC AB 2-0 SH 27 (SUTURE)
SUT VIC AB 2-0 SH 27XBRD (SUTURE) IMPLANT
SUT VIC AB 3-0 SH 18 (SUTURE) ×2 IMPLANT
SUT VIC AB 3-0 SH 27 (SUTURE)
SUT VIC AB 3-0 SH 27XBRD (SUTURE) IMPLANT
SYR CONTROL 10ML LL (SYRINGE) ×2 IMPLANT
TOWEL OR 17X26 10 PK STRL BLUE (TOWEL DISPOSABLE) ×2 IMPLANT
TRAY DSU PREP LF (CUSTOM PROCEDURE TRAY) ×2 IMPLANT
TUBE CONNECTING 12X1/4 (SUCTIONS) ×2 IMPLANT
YANKAUER SUCT BULB TIP NO VENT (SUCTIONS) ×2 IMPLANT

## 2020-08-15 NOTE — H&P (Signed)
The patient is a 55 year old female who presents with a subcutaneous abscess. Pt presented to urgent office on 08/19/18 for an open wound on her left buttock.  She underwent I&D of this area on 09/09/18.  She states after this, the wound healed slowly, but she continues to have some issues with a portion not completely healing.  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  We discussed surgery, them, but she decided to wait on this.  She is here today to be reevaluated and discuss this further.  She reports occasional purulent drainage.       Problem List/Past Medical Romie Levee, MD; 06/10/2020 11:22 AM) ABSCESS (L02.91)  ANAL FISTULA (K60.3)    Past Surgical History Romie Levee, MD; 06/10/2020 11:22 AM) Cataract Surgery  Right. Oral Surgery    Diagnostic Studies History Romie Levee, MD; 06/10/2020 11:22 AM) Colonoscopy  1-5 years ago Mammogram  1-3 years ago Pap Smear  1-5 years ago   Allergies Arbutus Ped, CMA; 06/10/2020 11:00 AM) Aspirin *ANALGESICS - NonNarcotic*  Allergies Reconciled    Medication History Arbutus Ped, CMA; 06/10/2020 11:00 AM) Vitamin D  (400UNIT Capsule, 1 (one) Oral) Active. Iron  (18MG  Tablet ER, Oral) Active. Magnesium  (30MG  Tablet, Oral) Active. Pantoprazole Sodium  (20MG  Tablet DR, Oral) Active. Mupirocin  (2% Ointment, External) Active. Pantoprazole Sodium  (40MG  Tablet DR, Oral) Active. Mupirocin Calcium  (2% Cream, External) Active. Medications Reconciled    Social History , MD; 06/10/2020 11:22 AM) Alcohol use  Occasional alcohol use. Caffeine use  Coffee. No drug use  Tobacco use  Never smoker.   Family History , MD; 06/10/2020 11:22 AM) Diabetes Mellitus  Mother. Hypertension  Father.   Pregnancy / Birth History Romie Levee, MD; 06/10/2020 11:22 AM) Age at menarche  14 years. Age of menopause  61-50 Gravida  2 Length (months) of breastfeeding  7-12 Maternal age   11-30 Para  1   Other Problems Romie Levee, MD; 06/10/2020 11:22 AM) Gastroesophageal Reflux Disease  Hemorrhoids          Review of Systems  General Not Present- Appetite Loss, Chills, Fatigue, Fever, Night Sweats, Weight Gain and Weight Loss. Skin Present- New Lesions. Not Present- Change in Wart/Mole, Dryness, Hives, Jaundice, Non-Healing Wounds, Rash and Ulcer. HEENT Present- Wears glasses/contact lenses. Not Present- Earache, Hearing Loss, Hoarseness, Nose Bleed, Oral Ulcers, Ringing in the Ears, Seasonal Allergies, Sinus Pain, Sore Throat, Visual Disturbances and Yellow Eyes. Respiratory Not Present- Bloody sputum, Chronic Cough, Difficulty Breathing, Snoring and Wheezing. Breast Not Present- Breast Mass, Breast Pain, Nipple Discharge and Skin Changes. Cardiovascular Not Present- Chest Pain, Difficulty Breathing Lying Down, Leg Cramps, Palpitations, Rapid Heart Rate, Shortness of Breath and Swelling of Extremities. Gastrointestinal Not Present- Abdominal Pain, Bloating, Bloody Stool, Change in Bowel Habits, Chronic diarrhea, Constipation, Difficulty Swallowing, Excessive gas, Gets full quickly at meals, Hemorrhoids, Indigestion, Nausea, Rectal Pain and Vomiting. Female Genitourinary Not Present- Frequency, Nocturia, Painful Urination, Pelvic Pain and Urgency. Musculoskeletal Not Present- Back Pain, Joint Pain, Joint Stiffness, Muscle Pain, Muscle Weakness and Swelling of Extremities. Neurological Not Present- Decreased Memory, Fainting, Headaches, Numbness, Seizures, Tingling, Tremor, Trouble walking and Weakness. Psychiatric Not Present- Anxiety, Bipolar, Change in Sleep Pattern, Depression, Fearful and Frequent crying. Endocrine Not Present- Cold Intolerance, Excessive Hunger, Hair Changes, Heat Intolerance, Hot flashes and New Diabetes. Hematology Present- Easy Bruising and Persistent Infections. Not Present- Blood Thinners, Excessive bleeding, Gland problems and HIV.  Physical Exam    General Mental Status - Alert. General Appearance - Cooperative.  CV: RRR Lungs: CTA Abd: soft Rectal Note:  Left lateral region: There is a 1 cm x 0.5 cm raised lesion in the left lateral perianal area with purulent drainage concerning for external fistula opening.         Assessment & Plan    ANAL FISTULA (K60.3) Impression: Patient appears to have a left lateral anal fistula. She tends towards more constipation for her bowel habits. We discussed exam under anesthesia with fistulotomy versus seton. We discussed the risk of fistulotomy, which include equal incontinence. We discussed that if more than 20% of her sphincter complex was involved, we would proceed with seton placement and subsequent LIF T procedure. We discussed the typical healing times and need to be off of work. We will plan on her being out of work for approximately 1 week after surgery.

## 2020-08-15 NOTE — Transfer of Care (Signed)
Immediate Anesthesia Transfer of Care Note  Patient: Gail Duncan  Procedure(s) Performed: FISTULOTOMY (Anus) EXAM UNDER ANESTHESIA (Anus)  Patient Location: PACU  Anesthesia Type:MAC  Level of Consciousness: awake, alert  and oriented  Airway & Oxygen Therapy: Patient Spontanous Breathing  Post-op Assessment: Report given to RN  Post vital signs: Reviewed and stable  Last Vitals:  Vitals Value Taken Time  BP 140/78 08/15/20 0807  Temp    Pulse 95 08/15/20 0808  Resp 14 08/15/20 0808  SpO2 97 % 08/15/20 0808  Vitals shown include unvalidated device data.  Last Pain:  Vitals:   08/15/20 0611  TempSrc: Oral  PainSc: 0-No pain      Patients Stated Pain Goal: 5 (40/69/86 1483)  Complications: No notable events documented.

## 2020-08-15 NOTE — Anesthesia Postprocedure Evaluation (Signed)
Anesthesia Post Note  Patient: Gail Duncan  Procedure(s) Performed: FISTULOTOMY (Anus) EXAM UNDER ANESTHESIA (Anus)     Patient location during evaluation: PACU Anesthesia Type: MAC Level of consciousness: awake and alert Pain management: pain level controlled Vital Signs Assessment: post-procedure vital signs reviewed and stable Respiratory status: spontaneous breathing and respiratory function stable Cardiovascular status: stable Postop Assessment: no apparent nausea or vomiting Anesthetic complications: no   No notable events documented.  Last Vitals:  Vitals:   08/15/20 0830 08/15/20 0850  BP: 121/77 (!) 143/85  Pulse: 81 76  Resp: 17 16  Temp:  (!) 36.3 C  SpO2: 98% 99%    Last Pain:  Vitals:   08/15/20 0850  TempSrc:   PainSc: 0-No pain                 Laquana Villari DANIEL

## 2020-08-15 NOTE — Op Note (Signed)
08/15/2020  7:57 AM  PATIENT:  Gail Duncan  55 y.o. female  Patient Care Team: Ma Hillock, DO as PCP - General (Family Medicine) Marylynn Pearson, MD as Consulting Physician (Obstetrics and Gynecology) Verner Mould, MD as Consulting Physician (Orthopedic Surgery)  PRE-OPERATIVE DIAGNOSIS:  ANAL FISTULA  POST-OPERATIVE DIAGNOSIS:  ANAL FISTULA  PROCEDURE:  FISTULOTOMY EXAM UNDER ANESTHESIA   Surgeon(s): Leighton Ruff, MD  ASSISTANT: none   ANESTHESIA:   local and MAC  SPECIMEN:  No Specimen  DISPOSITION OF SPECIMEN:  N/A  COUNTS:  YES  PLAN OF CARE: Discharge to home after PACU  PATIENT DISPOSITION:  PACU - hemodynamically stable.  INDICATION: 55 y.o. F with anal fistula of the L lateral anal canal   OR FINDINGS: L lateral fistula involving superficial external sphincter only  DESCRIPTION: the patient was identified in the preoperative holding area and taken to the OR where they were laid on the operating room table.  MAC anesthesia was induced without difficulty. The patient was then positioned in prone jackknife position with buttocks gently taped apart.  The patient was then prepped and draped in usual sterile fashion.  SCDs were noted to be in place prior to the initiation of anesthesia. A surgical timeout was performed indicating the correct patient, procedure, positioning and need for preoperative antibiotics.  A rectal block was performed using Marcaine with epinephrine.    I began with a digital rectal exam.  An internal opening could be noted distal in the anal canal.  I then placed a Hill-Ferguson anoscope into the anal canal and evaluated this completely.  There was no hemorrhoid disease.  Patient had an obvious internal and external opening.  A fistula probe was placed into the external opening.  Fistula involved only a small portion of the superficial external sphincter.  There was no internal sphincter involvement.  I decided to perform a  fistulotomy.  This was done using Bovie electrocautery.  The sphincter complex was tacked to the fistula tract using interrupted 3-0 Vicryl sutures.  The edges of the fistula tract were marsupialized using a running 2-0 chromic suture.  Additional Marcaine was placed around the incision site.  The patient was then awakened from anesthesia and sent to the postanesthesia care unit in stable condition.  All counts were correct per operating room staff.

## 2020-08-15 NOTE — Discharge Instructions (Addendum)
ANORECTAL SURGERY: POST OP INSTRUCTIONS Take your usually prescribed home medications unless otherwise directed. DIET: During the first few hours after surgery sip on some liquids until you are able to urinate.  It is normal to not urinate for several hours after this surgery.  If you feel uncomfortable, please contact the office for instructions.  After you are able to urinate,you may eat, if you feel like it.  Follow a light bland diet the first 24 hours after arrival home, such as soup, liquids, crackers, etc.  Be sure to include lots of fluids daily (6-8 glasses).  Avoid fast food or heavy meals, as your are more likely to get nauseated.  Eat a low fat diet the next few days after surgery.  Limit caffeine intake to 1-2 servings a day. PAIN CONTROL: Pain is best controlled by a usual combination of several different methods TOGETHER: Muscle relaxation: Soak in a warm bath (or Sitz bath) three times a day and after bowel movements.  Continue to do this until all pain is resolved. Over the counter pain medication Prescription pain medication Most patients will experience some swelling and discomfort in the anus/rectal area and incisions.  Heat such as warm towels, sitz baths, warm baths, etc to help relax tight/sore spots and speed recovery.  Some people prefer to use ice, especially in the first couple days after surgery, as it may decrease the pain and swelling, or alternate between ice & heat.  Experiment to what works for you.  Swelling and bruising can take several weeks to resolve.  Pain can take even longer to completely resolve. It is helpful to take an over-the-counter pain medication regularly for the first few weeks.  Choose one of the following that works best for you: Naproxen (Aleve, etc)  Two 220mg tabs twice a day Ibuprofen (Advil, etc) Three 200mg tabs four times a day (every meal & bedtime) A  prescription for pain medication (such as percocet, oxycodone, hydrocodone, etc) should be  given to you upon discharge.  Take your pain medication as prescribed.  If you are having problems/concerns with the prescription medicine (does not control pain, nausea, vomiting, rash, itching, etc), please call us [phone] to see if we need to switch you to a different pain medicine that will work better for you and/or control your side effect better. If you need a refill on your pain medication, please contact your pharmacy.  They will contact our office to request authorization. Prescriptions will not be filled after 5 pm or on week-ends. KEEP YOUR BOWELS REGULAR and AVOID CONSTIPATION The goal is one to two soft bowel movements a day.  You should at least have a bowel movement every other day. Avoid getting constipated.  Between the surgery and the pain medications, it is common to experience some constipation. This can be very painful after rectal surgery.  Increasing fluid intake and taking a fiber supplement (such as Metamucil, Citrucel, FiberCon, etc) 1-2 times a day regularly will usually help prevent this problem from occurring.  A stool softener like colace is also recommended.  This can be purchased over the counter at your pharmacy.  You can take it up to 3 times a day.  If you do not have a bowel movement after 24 hrs since your surgery, take one does of milk of magnesia.  If you still haven't had a bowel movement 8-12 hours after that dose, take another dose.  If you don't have a bowel movement 48 hrs after surgery,   purchase a Fleets enema from the drug store and administer gently per package instructions.  If you still are having trouble with your bowel movements after that, please call the office for further instructions. If you develop diarrhea or have many loose bowel movements, simplify your diet to bland foods & liquids for a few days.  Stop any stool softeners and decrease your fiber supplement.  Switching to mild anti-diarrheal medications (Kayopectate, Pepto Bismol) can help.   If this worsens or does not improve, please call us.  Wound Care Remove your bandages before your first bowel movement or 8 hours after surgery.     Remove any wound packing material at this tim,e as well.  You do not need to repack the wound unless instructed otherwise.  Wear an absorbent pad or soft cotton gauze in your underwear to catch any drainage and help keep the area clean. You should change this every 2-3 hours while awake. Keep the area clean and dry.  Bathe / shower every day, especially after bowel movements.  Keep the area clean by showering / bathing over the incision / wound.   It is okay to soak an open wound to help wash it.  Wet wipes or showers / gentle washing after bowel movements is often less traumatic than regular toilet paper. You may have some styrofoam-like soft packing in the rectum which will come out with the first bowel movement.  You will often notice bleeding with bowel movements.  This should slow down by the end of the first week of surgery Expect some drainage.  This should slow down, too, by the end of the first week of surgery.  Wear an absorbent pad or soft cotton gauze in your underwear until the drainage stops. Do Not sit on a rubber or pillow ring.  This can make you symptoms worse.  You may sit on a soft pillow if needed.  ACTIVITIES as tolerated:   You may resume regular (light) daily activities beginning the next day--such as daily self-care, walking, climbing stairs--gradually increasing activities as tolerated.  If you can walk 30 minutes without difficulty, it is safe to try more intense activity such as jogging, treadmill, bicycling, low-impact aerobics, swimming, etc. Save the most intensive and strenuous activity for last such as sit-ups, heavy lifting, contact sports, etc  Refrain from any heavy lifting or straining until you are off narcotics for pain control.   You may drive when you are no longer taking prescription pain medication, you can  comfortably sit for long periods of time, and you can safely maneuver your car and apply brakes. You may have sexual intercourse when it is comfortable.  FOLLOW UP in our office Please call [facility] at [phone] to set up an appointment to see your surgeon in the office for a follow-up appointment approximately 3-4 weeks after your surgery. Make sure that you call for this appointment the day you arrive home to insure a convenient appointment time. 10. IF YOU HAVE DISABILITY OR FAMILY LEAVE FORMS, BRING THEM TO THE OFFICE FOR PROCESSING.  DO NOT GIVE THEM TO YOUR DOCTOR.     WHEN TO CALL US [phone]: Poor pain control Reactions / problems with new medications (rash/itching, nausea, etc)  Fever over 101.5 F (38.5 C) Inability to urinate Nausea and/or vomiting Worsening swelling or bruising Continued bleeding from incision. Increased pain, redness, or drainage from the incision  The clinic staff is available to answer your questions during regular business hours (8:30am-5pm).    Please don't hesitate to call and ask to speak to one of our nurses for clinical concerns.   A surgeon from [facility] is always on call at the hospitals   If you have a medical emergency, go to the nearest emergency room or call 911.    [facility] [address], [address], [place], [place]  [address] ? MAIN: [phone] ? TOLL FREE: [phone] ? FAX [phone] [email or web address]    Post Anesthesia Home Care Instructions  Activity: Get plenty of rest for the remainder of the day. A responsible individual must stay with you for 24 hours following the procedure.  For the next 24 hours, DO NOT: -Drive a car -Operate machinery -Drink alcoholic beverages -Take any medication unless instructed by your physician -Make any legal decisions or sign important papers.  Meals: Start with liquid foods such as gelatin or soup. Progress to regular foods  as tolerated. Avoid greasy, spicy, heavy foods. If nausea and/or vomiting occur, drink only clear liquids until the nausea and/or vomiting subsides. Call your physician if vomiting continues.  Special Instructions/Symptoms: Your throat may feel dry or sore from the anesthesia or the breathing tube placed in your throat during surgery. If this causes discomfort, gargle with warm salt water. The discomfort should disappear within 24 hours.  If you had a scopolamine patch placed behind your ear for the management of post- operative nausea and/or vomiting:  1. The medication in the patch is effective for 72 hours, after which it should be removed.  Wrap patch in a tissue and discard in the trash. Wash hands thoroughly with soap and water. 2. You may remove the patch earlier than 72 hours if you experience unpleasant side effects which may include dry mouth, dizziness or visual disturbances. 3. Avoid touching the patch. Wash your hands with soap and water after contact with the patch.     

## 2020-08-15 NOTE — Anesthesia Procedure Notes (Signed)
Procedure Name: MAC Date/Time: 08/15/2020 7:34 AM Performed by: Bonney Aid, CRNA Pre-anesthesia Checklist: Patient identified, Emergency Drugs available, Suction available, Patient being monitored and Timeout performed Patient Re-evaluated:Patient Re-evaluated prior to induction Oxygen Delivery Method: Nasal cannula Placement Confirmation: positive ETCO2

## 2020-08-16 ENCOUNTER — Encounter (HOSPITAL_BASED_OUTPATIENT_CLINIC_OR_DEPARTMENT_OTHER): Payer: Self-pay | Admitting: General Surgery

## 2020-10-01 ENCOUNTER — Ambulatory Visit: Payer: Federal, State, Local not specified - PPO | Admitting: Family Medicine

## 2020-10-01 ENCOUNTER — Encounter: Payer: Self-pay | Admitting: Family Medicine

## 2020-10-01 ENCOUNTER — Other Ambulatory Visit: Payer: Self-pay

## 2020-10-01 VITALS — BP 115/76 | HR 69 | Temp 98.2°F | Wt 161.0 lb

## 2020-10-01 DIAGNOSIS — K601 Chronic anal fissure: Secondary | ICD-10-CM | POA: Insufficient documentation

## 2020-10-01 DIAGNOSIS — R3989 Other symptoms and signs involving the genitourinary system: Secondary | ICD-10-CM | POA: Diagnosis not present

## 2020-10-01 DIAGNOSIS — R196 Halitosis: Secondary | ICD-10-CM

## 2020-10-01 DIAGNOSIS — R1319 Other dysphagia: Secondary | ICD-10-CM | POA: Insufficient documentation

## 2020-10-01 DIAGNOSIS — K594 Anal spasm: Secondary | ICD-10-CM

## 2020-10-01 HISTORY — DX: Chronic anal fissure: K60.1

## 2020-10-01 HISTORY — DX: Halitosis: R19.6

## 2020-10-01 NOTE — Progress Notes (Signed)
This visit occurred during the SARS-CoV-2 public health emergency.  Safety protocols were in place, including screening questions prior to the visit, additional usage of staff PPE, and extensive cleaning of exam room while observing appropriate contact time as indicated for disinfecting solutions.    Gail Duncan , [birth year], 55 y.o., female MRN: [id number] Patient Care Team    Relationship Specialty Notifications Start End  Natalia Leatherwood, DO PCP - General Family Medicine  09/07/16   Zelphia Cairo, MD Consulting Physician Obstetrics and Gynecology  05/13/17   Ernest Mallick, MD Consulting Physician Orthopedic Surgery  04/12/19     Chief Complaint  Patient presents with   Vaginal pain    Pt c/o sharp vaginal pain that radiates a short distance within the vaginal area x 12 hours; denies dysuria, itching or discharge     Subjective: Pt presents for an OV with complaints of pain that she describes as vaginal pain that radiates from her perineum upwards.  She denies fever, chills, melena, vaginal irritation or discharge.  She denies dyspareunia or vaginal bleeding.  She states she does not see any lesions or erythema.  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 Bachelor's degree    Moved to [place], from [place] in July 2016.    EtOH use occasional,, nonsmoker, denies recreational drugs.   Drinks caffeinated beverages, uses herbal remedies, takes daily vitamins   Patient wears her seatbelt, wears a bike,, exercises at least 3 times a week. There is a smoke alarm in her home.   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   Cataract    GERD  (gastroesophageal reflux disease)    Hearing loss in right ear    Unknown cause, had full work up with Specialist in [place]   History of chicken pox    as child per pt   Pneumonia    20  yrs ago per pt on 08-09-2020   Retinal detachment    right eye 07-2017 per pt   Wears glasses    Past Surgical History:  Procedure Laterality Date   ABDOMINAL EXPLORATION SURGERY     20 yrs ago per pt on 08-09-2020   ABLATION     uterine, 1=9 to 10 yrs ago per pt on 08-09-2020   ANAL FISTULOTOMY N/A 08/15/2020   Procedure: FISTULOTOMY;  Surgeon: Romie Levee, MD;  Location: [facility];  Service: General;  Laterality: N/A;   CATARACT EXTRACTION Right 11/23/2017   Dr. Elmer Picker   CATARACT EXTRACTION Left 01/2020   Dr. Elmer Picker   DENTAL SURGERY     root canal 2020, tooth extraction 17 yrs ago as of 08-09-2020   EYE SURGERY Right 11/23/2017   Cat Sx - Dr. Elmer Picker   EYE SURGERY Left 01/2020   Cat Sx - Dr. Elmer Picker   EYE SURGERY Right 08/09/2017   SBP+PPV - Dr. Rennis Chris   GAS INSERTION Right 08/09/2017   Procedure: INSERTION OF GAS;  Surgeon: Rennis Chris, MD;  Location: [facility] OR;  Service: Ophthalmology;  Laterality: Right;   LASER PHOTO ABLATION Left 08/09/2017  Procedure: LASER PHOTO ABLATION;  Surgeon: Rennis Chris, MD;  Location: [facility] OR;  Service: Ophthalmology;  Laterality: Left;   miscarriage     210 yrs ago per on on 08-09-2020   RECTAL EXAM UNDER ANESTHESIA N/A 08/15/2020   Procedure: EXAM UNDER ANESTHESIA;  Surgeon: Romie Levee, MD;  Location: [facility];  Service: General;  Laterality: N/A;   RETINAL DETACHMENT SURGERY Right 08/09/2017   SBP+PPV - Dr. Rennis Chris   SCLERAL BUCKLE WITH POSSIBLE 25 GAUGE PARS PLANA VITRECTOMY Right 08/09/2017   Procedure: SCLERAL BUCKLE WITH 25 GAUGE PARS PLANA VITRECTOMY AND ENDOLASER;  Surgeon: Rennis Chris, MD;  Location: [facility] OR;  Service: Ophthalmology;  Laterality: Right;   YAG LASER APPLICATION Right 06/14/2019   Family  History  Problem Relation Age of Onset   Diabetes Mother    Prostate cancer Father    Hypertension Father    Retinal detachment Father    Lung cancer Paternal Grandfather    Amblyopia Neg Hx    Blindness Neg Hx    Cataracts Neg Hx    Glaucoma Neg Hx    Macular degeneration Neg Hx    Strabismus Neg Hx    Retinitis pigmentosa Neg Hx    Allergies as of 10/01/2020       Reactions   Asa [aspirin] Other (See Comments)   Contraindicated due to hearing loss        Medication List        Accurate as of October 01, 2020 11:59 PM. If you have any questions, ask your nurse or doctor.          STOP taking these medications    pantoprazole 20 MG tablet Commonly known as: PROTONIX Stopped by: Felix Pacini, DO   traMADol 50 MG tablet Commonly known as: ULTRAM Stopped by: Felix Pacini, DO       TAKE these medications    CITROMA PO 3 x week   CoQ10-Vitamin E 100-10 MG-UNIT Caps Take 1 capsule by mouth. Takes 3 x week   OVER THE COUNTER MEDICATION Neoceol  collagen liquid .05 fluid ounce per 1 tablespoon takes 5-6 x week   Vitamin D3 125 MCG (5000 UT) Caps Take 1 capsule (5,000 Units total) by mouth 3 (three) times a week.        All past medical history, surgical history, allergies, family history, immunizations andmedications were updated in the EMR today and reviewed under the history and medication portions of their EMR.     ROS: Negative, with the exception of above mentioned in HPI   Objective:  BP 115/76   Pulse 69   Temp 98.2 F (36.8 C) (Oral)   Wt 161 lb (73 kg)   LMP 12/22/2014   SpO2 100%   BMI 28.52 kg/m  Body mass index is 28.52 kg/m. Gen: Afebrile. No acute distress. Nontoxic in appearance, well developed, well nourished.  HENT: AT. .  Eyes:Pupils Equal Round Reactive to light, Extraocular movements intact,  Conjunctiva without redness, discharge or icterus. Abd: Soft. NTND. BS present. No Masses palpated. No rebound or guarding.  Neuro:  Normal gait. PERLA. EOMi. Alert. Oriented x3  Female genitalia: Bladder no bladder distension noted Urethra: normal appearing urethra with no masses, tenderness or lesions Clitoris: normal appearing clitoris with no hypertrophy or enlargement Bartholin's Glands: normal appearing Bartholin's glands with no masses, tenderness or erythema Vulva: normal appearing vulva with no masses, tenderness or lesions Vagina: normal appearing vagina with normal color and discharge, no lesions Rectal:  hemorrhoids: external non-thrombosed   No results found. No results found. Results for orders placed or performed in visit on 10/01/20 (from the past 24 hour(s))  Urinalysis w microscopic + reflex cultur     Status: None   Collection Time: 10/01/20  4:47 PM   Specimen: Urine  Result Value Ref Range   Color, Urine PALE YELLOW YELLOW   APPearance CLEAR CLEAR   Specific Gravity, Urine 1.010 1.001 - 1.035   pH 8.0 5.0 - 8.0   Glucose, UA NEGATIVE NEGATIVE   Bilirubin Urine NEGATIVE NEGATIVE   Ketones, ur NEGATIVE NEGATIVE   Hgb urine dipstick NEGATIVE NEGATIVE   Protein, ur NEGATIVE NEGATIVE   Nitrites, Initial NEGATIVE NEGATIVE   Leukocyte Esterase NEGATIVE NEGATIVE   WBC, UA 0-5 0 - 5 /HPF   RBC / HPF NONE SEEN 0 - 2 /HPF   Squamous Epithelial / LPF NONE SEEN < OR = 5 /HPF   Bacteria, UA NONE SEEN NONE SEEN /HPF   Hyaline Cast NONE SEEN NONE SEEN /LPF   Note    REFLEXIVE URINE CULTURE     Status: None   Collection Time: 10/01/20  4:47 PM  Result Value Ref Range   Reflexve Urine Culture      Assessment/Plan: Gail Duncan is a 55 y.o. 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  She has had an anal fistulotomy repair about 6 weeks ago.  We discussed the innervation to this area. Also discussed her bowel movements.  The last pressure applied to this area of her anatomy, though less  likely she would have repeat symptoms.  I have encouraged her to not only use the MiraLAX but to start Senokot 2 tabs nightly. If symptoms continue to occur, I have encouraged her to follow-up with her surgeon.  Reviewed expectations re: course of current medical issues. Discussed self-management of symptoms. Outlined signs and symptoms indicating need for more acute intervention. Patient verbalized understanding and all questions were answered. Patient received an After-Visit Summary.    Orders Placed This Encounter  Procedures   Urinalysis w microscopic + reflex cultur   REFLEXIVE URINE CULTURE   No orders of the defined types were placed in this encounter.  Referral Orders  No referral(s) requested today     Note is dictated utilizing voice recognition software. Although note has been proof read prior to signing, occasional typographical errors still can be missed. If any questions arise, please do not hesitate to call for verification.   electronically signed by:  Felix Pacini, DO  [facility] - [place]

## 2020-10-01 NOTE — Patient Instructions (Signed)
[email or web address]   Proctalgia Fugax  Proctalgia fugax is a condition that involves short episodes of intense pain in the rectum. The rectum is the last part of the large intestine. The pain can last from seconds to minutes. Episodes often occur during the night and wake the person from sleep. This condition is not a sign of cancer, but your healthcare provider may want to rule out other conditions. What are the causes? The exact cause of this condition is not known. One possible cause may be spasmof the pelvic muscles or spasms of the lowest part of the large intestine. What are the signs or symptoms? The only symptom of this condition is rectal pain. The pain may: Be intense or severe. Last for only a few seconds, or it may last up to 30 minutes. Occur at night and wake you up from sleep. How is this diagnosed? This condition may be diagnosed based on your medical history, a physical exam, and by ruling out other problems that could cause the pain. You may have various tests, such as: Anoscopy. In this test, a lighted scope is put into the rectum to look for abnormalities. Barium enema. In this test, X-rays are taken after a white, chalky substance called barium is put into the colon. The barium shows up well on the X-rays and makes it easier to see any problems. Blood tests to rule out infections or other problems. How is this treated? There is no specific treatment for this condition. This condition may be managed with: Medicines. Warm baths. Relaxation techniques, such as deep breathing exercises or gentle yoga. Gentle massage of the painful area. Biofeedback. Follow these instructions at home:  Take over-the-counter and prescription medicines only as told by your health care provider. Follow instructions from your health care provider about eating or drinking restrictions. Ask your health care provider what activities are  safe for you. Try warm baths, massaging the area, or progressive relaxation techniques as told by your health care provider. Keep all follow-up visits as told by your health care provider. This is important. Contact a health care provider if: Your pain gets worse. You develop new symptoms. You have anal pain along with a fever. Get help right away if you: Have blood in your stool or blood coming from the rectal area. Summary Proctalgia fugax is a condition that involves short episodes of intense pain in the rectum. Episodes often occur during the night and wake the person from sleep. The cause of this condition is not known. Medicines, warm baths, massage, relaxation techniques, and biofeedback may help to manage the pain. Get help right away if you have blood in your stool or blood coming from the rectal area. This information is not intended to replace advice given to you by your health care provider. Make sure you discuss any questions you have with your healthcare provider. Document Revised: 09/29/2017 Document Reviewed: 09/29/2017 Elsevier Patient Education  2022 [employer].

## 2020-10-02 LAB — URINALYSIS W MICROSCOPIC + REFLEX CULTURE
Bacteria, UA: NONE SEEN /HPF
Bilirubin Urine: NEGATIVE
Glucose, UA: NEGATIVE
Hgb urine dipstick: NEGATIVE
Hyaline Cast: NONE SEEN /LPF
Ketones, ur: NEGATIVE
Leukocyte Esterase: NEGATIVE
Nitrites, Initial: NEGATIVE
Protein, ur: NEGATIVE
RBC / HPF: NONE SEEN /HPF (ref 0–2)
Specific Gravity, Urine: 1.01 (ref 1.001–1.035)
Squamous Epithelial / HPF: NONE SEEN /HPF (ref <=5)
pH: 8 (ref 5.0–8.0)

## 2020-10-02 LAB — NO CULTURE INDICATED

## 2020-10-07 ENCOUNTER — Other Ambulatory Visit: Payer: Self-pay | Admitting: Family Medicine

## 2020-10-07 DIAGNOSIS — Z1231 Encounter for screening mammogram for malignant neoplasm of breast: Secondary | ICD-10-CM

## 2020-10-23 ENCOUNTER — Other Ambulatory Visit: Payer: Self-pay

## 2020-10-23 ENCOUNTER — Ambulatory Visit
Admission: RE | Admit: 2020-10-23 | Discharge: 2020-10-23 | Disposition: A | Discharge status: Home / Self Care | Payer: Federal, State, Local not specified - PPO | Source: Ambulatory Visit | Attending: Family Medicine | Admitting: Family Medicine

## 2020-10-23 DIAGNOSIS — Z1231 Encounter for screening mammogram for malignant neoplasm of breast: Secondary | ICD-10-CM | POA: Diagnosis not present

## 2020-11-06 ENCOUNTER — Encounter (INDEPENDENT_AMBULATORY_CARE_PROVIDER_SITE_OTHER): Payer: Federal, State, Local not specified - PPO | Admitting: Ophthalmology

## 2020-12-03 NOTE — Progress Notes (Signed)
[facility] Note  12/04/2020     CHIEF COMPLAINT Patient presents for Retina Follow Up  HISTORY OF PRESENT ILLNESS: Gail Duncan is a 55 y.o. female who presents to the clinic today for:   Referring physician: Ma Hillock, DO [address] [place],  [place] [id number]  HISTORICAL INFORMATION:   Selected notes from the [place] Referred by ED for possible RD   CURRENT MEDICATIONS: No current outpatient medications on file. (Ophthalmic Drugs)   No current facility-administered medications for this visit. (Ophthalmic Drugs)   Current Outpatient Medications (Other)  Medication Sig   Cholecalciferol (VITAMIN D3) 125 MCG (5000 UT) CAPS Take 1 capsule (5,000 Units total) by mouth 3 (three) times a week.   Coenzyme Q10-Vitamin E (COQ10-VITAMIN E) 100-10 MG-UNIT CAPS Take 1 capsule by mouth. Takes 3 x week   Magnesium Citrate (CITROMA PO) 3 x week   OVER THE COUNTER MEDICATION Neoceol  collagen liquid .05 fluid ounce per 1 tablespoon takes 5-6 x week   No current facility-administered medications for this visit. (Other)   REVIEW OF SYSTEMS: ROS   Positive for: Gastrointestinal, HENT, Eyes Negative for: Constitutional, Neurological, Skin, Genitourinary, Musculoskeletal, Endocrine, Cardiovascular, Respiratory, Psychiatric, Allergic/Imm, Heme/Lymph Last edited by Kingsley Spittle, COT on 12/04/2020  3:02 PM.    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   Cataract    GERD (gastroesophageal reflux disease)    Hearing loss in right ear    Unknown cause, had full work up with Specialist in [place]   History of chicken pox    as child per pt   Pneumonia    20  yrs ago per pt on 08-09-2020   Retinal detachment    right eye 07-2017 per pt   Wears glasses    Past Surgical History:   Procedure Laterality Date   ABDOMINAL EXPLORATION SURGERY     20 yrs ago per pt on 08-09-2020   ABLATION     uterine, 1=9 to 10 yrs ago per pt on 08-09-2020   ANAL FISTULOTOMY N/A 08/15/2020   Procedure: FISTULOTOMY;  Surgeon: Leighton Ruff, MD;  Location: [facility];  Service: General;  Laterality: N/A;   CATARACT EXTRACTION Right 11/23/2017   Dr. Herbert Deaner   CATARACT EXTRACTION Left 01/2020   Dr. Herbert Deaner   DENTAL SURGERY     root canal 2020, tooth extraction 17 yrs ago as of 08-09-2020   EYE SURGERY Right 11/23/2017   Cat Sx - Dr. Herbert Deaner   EYE SURGERY Left 01/2020   Cat Sx - Dr. Herbert Deaner   EYE SURGERY Right 08/09/2017   SBP+PPV - Dr. Bernarda Caffey   GAS INSERTION Right 08/09/2017   Procedure: INSERTION OF GAS;  Surgeon: Bernarda Caffey, MD;  Location: [facility];  Service: Ophthalmology;  Laterality: Right;   LASER PHOTO ABLATION Left 08/09/2017   Procedure: LASER PHOTO ABLATION;  Surgeon: Bernarda Caffey, MD;  Location: [facility];  Service: Ophthalmology;  Laterality: Left;   miscarriage     210 yrs ago per on on 08-09-2020   RECTAL EXAM UNDER ANESTHESIA N/A 08/15/2020   Procedure: EXAM UNDER ANESTHESIA;  Surgeon: Leighton Ruff, MD;  Location[facility];  Service: General;  Laterality: N/A;   RETINAL DETACHMENT SURGERY Right 08/09/2017   SBP+PPV -  Dr. Bernarda Caffey   SCLERAL BUCKLE WITH POSSIBLE 25 GAUGE PARS PLANA VITRECTOMY Right 08/09/2017   Procedure: SCLERAL BUCKLE WITH 25 GAUGE PARS PLANA VITRECTOMY AND ENDOLASER;  Surgeon: Bernarda Caffey, MD;  Location: [facility];  Service: Ophthalmology;  Laterality: Right;   YAG LASER APPLICATION Right 09/81/1914   FAMILY HISTORY Family History  Problem Relation Age of Onset   Diabetes Mother    Prostate cancer Father    Hypertension Father    Retinal detachment Father    Lung cancer Paternal Grandfather    Amblyopia Neg Hx    Blindness Neg Hx    Cataracts Neg Hx    Glaucoma Neg Hx    Macular degeneration Neg Hx     Strabismus Neg Hx    Retinitis pigmentosa Neg Hx    SOCIAL HISTORY Social History   Tobacco Use   Smoking status: Never   Smokeless tobacco: Never  Vaping Use   Vaping Use: Never used  Substance Use Topics   Alcohol use: Yes    Comment: rare monthly   Drug use: No       OPHTHALMIC EXAM:  Base Eye Exam     Visual Acuity (Snellen - Linear)       Right Left   Dist cc 20/20 20/20    Correction: Glasses         Tonometry (Tonopen, 3:07 PM)       Right Left   Pressure 13 12         Pupils       Dark Light Shape React APD   Right 3 2 Round Brisk None   Left 3 2 Round Brisk  None         Visual Fields (Counting fingers)       Left Right    Full Full         Extraocular Movement       Right Left    Full, Ortho Full, Ortho         Neuro/Psych     Oriented x3: Yes   Mood/Affect: Normal         Dilation     Both eyes: 1.0% Mydriacyl, 2.5% Phenylephrine @ 3:07 PM           Slit Lamp and Fundus Exam     Slit Lamp Exam       Right Left   Lids/Lashes Mild Dermatochalasis - upper lid, mild Meibomian gland dysfunction Mild Dermatochalasis - upper lid, mild Meibomian gland dysfunction   Conjunctiva/Sclera White and quiet White and quiet   Cornea 1+ inferior Punctate epithelial erosions, Well healed cataract wound at 1030 Trace Punctate epithelial erosions   Anterior Chamber Deep and quiet Deep and quiet   Iris Round and well dilated Round and well dilated   Lens PC IOL in good position; open PC PC IOL in good position, trace PCO   Vitreous Post vitrectomy; mild pigment Vitreous syneresis, Posterior vitreous detachment         Fundus Exam       Right Left   Disc Pink and Sharp, mildly Tilted disc, temporal PPA Pink and Sharp, mildly Tilted disc, mild temporal Peripapillary atrophy, Compact   C/D Ratio 0.3 0.4   Macula Flat, good foveal reflex, epiretinal membrane with striae, mild RPE mottling, No heme or edema Flat, blunted foveal  reflex, mild Retinal pigment epithelial mottling, No heme or edema, trace ERM nasal macula   Vessels attenuated, mild tortuousity Vascular attenuation, Tortuous, mild  copper wiring   Periphery Retina attached, good buckle height, good laser 360 on buckle and surrounding breaks, mild fibrosis over buckle at 10:30 -- stable from prior Attached, pigmented Lattice degeneration with atrophic holes at 0430; lattice at 1200 with atrophic break; good laser in place surrounding all lesions, no new RT/RD or lattice           Refraction     Wearing Rx       Sphere Cylinder Axis   Right -1.00 +0.50 180   Left -3.50 Sphere     Type: SVL           IMAGING AND PROCEDURES  Imaging and Procedures for _0 @  OCT, Retina - OU - Both Eyes       Right Eye Quality was good. Central Foveal Thickness: 355. Progression has been stable. Findings include no SRF, epiretinal membrane, myopic contour, no IRF, abnormal foveal contour (Persistent ERM, mild interval sharpening of foveal contour).   Left Eye Quality was good. Central Foveal Thickness: 284. Progression has improved. Findings include normal foveal contour, no IRF, no SRF (Interval development of mild ERM nasal macula).   Notes *Images captured and stored on drive  Diagnosis / Impression:  OD: Persistent ERM, mild interval sharpening of foveal contour OS: Interval development of mild ERM nasal macula  Clinical management:  See below  Abbreviations: NFP - Normal foveal profile. CME - cystoid macular edema. PED - pigment epithelial detachment. IRF - intraretinal fluid. SRF - subretinal fluid. EZ - ellipsoid zone. ERM - epiretinal membrane. ORA - outer retinal atrophy. ORT - outer retinal tubulation. SRHM - subretinal hyper-reflective material             ASSESSMENT/PLAN:    ICD-10-CM   1. RD (retinal detachment), right  H33.21     2. Epiretinal membrane (ERM) of right eye  H35.371     3. 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 Posterior vitreous detachment of left eye  H43.812     7. Pseudophakia, both eyes  Z96.1      1. Rhegmatogenous retinal detachment, RIGHT eye  - bullous superotemporal mac on detachment, onset of foveal involvement Saturday, 01/30/17 by history  - detached from 1030-1200 oclock, large horseshoe tear from 1030-1100 and linear tear at 1130 within bed of lattice  - s/p SBP + PPV/EL/FAX/14% C3F8 OD, 06.10.19             - retina attached and in good position -- good buckle height and laser around breaks  - BCVA 20/20 OD             - IOP OK today at 13  2,3.  Mild ERM OU  - persistent ERM w/ early pucker -- stable  - asymptomatic, no metamorphopsia  - BCVA remains 20/20 OU  - no indication for surgery at this time  - monitor for now  - f/u in 6-9 months, sooner prn, DFE, OCT  4,5. Lattice degeneration OU;  w/ atrophic breaks left eye  - OD - superotemporal lattice at site of breaks and detachment  - OS - patches of lattice at 12 and 430 with a single atrophic breaks at each site  - s/p laser retinopexy OS at time of surgery   - good laser in place  -  no new RT/RD or lattice OU  - monitor  6. Sub-acute PVD OS  **pt presented acutely on 2.23.22 for persistent flashes of light and floaters OS x1 day**   - hemorrhagic PVD OS  - today symptoms improved -- decreased floaters, no photopsias  - BCVA 20/20 OS -- stable  - Discussed findings and prognosis  - No RT or RD on 360 scleral depressed exam  - Reviewed s/s of RT/RD  - Strict return precautions for any such RT/RD signs/symptoms  7. Pseudophakia OU  - cataract OD progressively worsened following PPV  - s/p CE/IOL OD by expert surgeon Dr. Herbert Deaner on 09.24.19             - s/p CE/IOL OS by expert surgeon Dr. Herbert Deaner [phone]  - beautiful surgeries; new glasses on order  - BCVA 20/20 OU              - s/p yag cap OD (04.14.21)--excellent opening  Ophthalmic Meds Ordered this visit:  No orders of the defined types were placed in this encounter.    Return for 6-9 mo f/u for ERM OU w/DFE&OCT.  There are no Patient Instructions on file for this visit.  Explained the diagnoses, plan, and follow up with the patient and they expressed understanding.  Patient expressed understanding of the importance of proper follow up care.   This document serves as a record of services personally performed by Gardiner Sleeper, MD, PhD. It was created on their behalf by Estill Bakes, COT an ophthalmic technician. The creation of this record is the provider's dictation and/or activities during the visit.    Electronically signed by: Estill Bakes, COT 10.4.22 @ 12:38 AM  This document serves as a record of services personally performed by Gardiner Sleeper, MD, PhD. It was created on their behalf by Estill Bakes, COT an ophthalmic technician. The creation of this record is the provider's dictation and/or activities during the visit.    Electronically signed by: Estill Bakes, COT 10.5.22 @ 12:38 AM   Gardiner Sleeper, M.D., Ph.D. Diseases & Surgery of the Retina and Vitreous [facility] 10.5.22  I have reviewed the above documentation for accuracy and completeness, and I agree with the above. Gardiner Sleeper, M.D., Ph.D. 12/08/20 12:40 AM  Abbreviations: M myopia (nearsighted); A astigmatism; H hyperopia (farsighted); P presbyopia; Mrx spectacle prescription;  CTL contact lenses; OD right eye; OS left eye; OU both eyes  XT exotropia; ET esotropia; PEK punctate epithelial keratitis; PEE punctate epithelial erosions; DES dry eye syndrome; MGD meibomian gland dysfunction; ATs artificial tears; PFAT's preservative free artificial tears; [place] nuclear sclerotic cataract; PSC posterior subcapsular cataract; ERM epi-retinal membrane; PVD posterior vitreous detachment; RD retinal detachment; DM  diabetes mellitus; DR diabetic retinopathy; NPDR non-proliferative diabetic retinopathy; PDR proliferative diabetic retinopathy; CSME clinically significant macular edema; DME diabetic macular edema; dbh dot blot hemorrhages; CWS cotton wool spot; POAG primary open angle glaucoma; C/D cup-to-disc ratio; HVF humphrey visual field; GVF goldmann visual field; OCT optical coherence tomography; IOP intraocular pressure; BRVO Branch retinal vein occlusion; CRVO central retinal vein occlusion; CRAO central retinal artery occlusion; BRAO branch retinal artery occlusion; RT retinal tear; SB scleral buckle; PPV pars plana vitrectomy; VH Vitreous hemorrhage; PRP panretinal laser photocoagulation; IVK intravitreal kenalog; VMT vitreomacular traction; MH Macular hole;  NVD neovascularization of the disc; NVE neovascularization elsewhere; AREDS age related eye disease study; ARMD age related macular degeneration; POAG primary open angle glaucoma; EBMD epithelial/anterior basement membrane  dystrophy; ACIOL anterior chamber intraocular lens; IOL intraocular lens; PCIOL posterior chamber intraocular lens; Phaco/IOL phacoemulsification with intraocular lens placement; [place] photorefractive keratectomy; LASIK laser assisted in situ keratomileusis; HTN hypertension; DM diabetes mellitus; COPD chronic obstructive pulmonary disease

## 2020-12-04 ENCOUNTER — Encounter (INDEPENDENT_AMBULATORY_CARE_PROVIDER_SITE_OTHER): Payer: Self-pay | Admitting: Ophthalmology

## 2020-12-04 ENCOUNTER — Ambulatory Visit (INDEPENDENT_AMBULATORY_CARE_PROVIDER_SITE_OTHER): Payer: Federal, State, Local not specified - PPO | Admitting: Ophthalmology

## 2020-12-04 ENCOUNTER — Other Ambulatory Visit: Payer: Self-pay

## 2020-12-04 DIAGNOSIS — H35413 Lattice degeneration of retina, bilateral: Secondary | ICD-10-CM | POA: Diagnosis not present

## 2020-12-04 DIAGNOSIS — H33332 Multiple defects of retina without detachment, left eye: Secondary | ICD-10-CM | POA: Diagnosis not present

## 2020-12-04 DIAGNOSIS — H35371 Puckering of macula, right eye: Secondary | ICD-10-CM

## 2020-12-04 DIAGNOSIS — H3581 Retinal edema: Secondary | ICD-10-CM

## 2020-12-04 DIAGNOSIS — Z961 Presence of intraocular lens: Secondary | ICD-10-CM

## 2020-12-04 DIAGNOSIS — H43812 Vitreous degeneration, left eye: Secondary | ICD-10-CM

## 2020-12-04 DIAGNOSIS — H3321 Serous retinal detachment, right eye: Secondary | ICD-10-CM

## 2020-12-08 ENCOUNTER — Encounter (INDEPENDENT_AMBULATORY_CARE_PROVIDER_SITE_OTHER): Payer: Self-pay | Admitting: Ophthalmology

## 2021-02-19 DIAGNOSIS — Z01419 Encounter for gynecological examination (general) (routine) without abnormal findings: Secondary | ICD-10-CM | POA: Diagnosis not present

## 2021-03-10 ENCOUNTER — Other Ambulatory Visit: Payer: Self-pay

## 2021-03-10 ENCOUNTER — Encounter (INDEPENDENT_AMBULATORY_CARE_PROVIDER_SITE_OTHER): Payer: Self-pay | Admitting: Ophthalmology

## 2021-03-10 ENCOUNTER — Ambulatory Visit (INDEPENDENT_AMBULATORY_CARE_PROVIDER_SITE_OTHER): Payer: Federal, State, Local not specified - PPO | Admitting: Ophthalmology

## 2021-03-10 DIAGNOSIS — H35371 Puckering of macula, right eye: Secondary | ICD-10-CM | POA: Diagnosis not present

## 2021-03-10 DIAGNOSIS — H3321 Serous retinal detachment, right eye: Secondary | ICD-10-CM | POA: Diagnosis not present

## 2021-03-10 DIAGNOSIS — H33332 Multiple defects of retina without detachment, left eye: Secondary | ICD-10-CM | POA: Diagnosis not present

## 2021-03-10 DIAGNOSIS — H35413 Lattice degeneration of retina, bilateral: Secondary | ICD-10-CM

## 2021-03-10 DIAGNOSIS — Z961 Presence of intraocular lens: Secondary | ICD-10-CM

## 2021-03-10 DIAGNOSIS — H43812 Vitreous degeneration, left eye: Secondary | ICD-10-CM

## 2021-03-10 NOTE — Progress Notes (Signed)
[facility] Note  03/10/2021     CHIEF COMPLAINT Patient presents for Retina Follow Up   HISTORY OF PRESENT ILLNESS: Gail Duncan is a 56 y.o. female who presents to the clinic today for:   HPI     Retina Follow Up   Patient presents with  Other.  In right eye.  This started years ago.  Severity is mild.  Duration of 3 months.  Since onset it is stable.  I, the attending physician,  performed the HPI with the patient and updated documentation appropriately.        Comments   56 y/o female pt here c/o decreased VA OD.  Over the last several days, pt has noticed it is harder to focus at near and intermediate with her glasses.  No change in DVA OU noticed.  Pt also had intense pain around her right eye along with a "pulling sensation" for most of the day 2 days ago, but the pain has since ceased.  Pt wonders if it could be related to dry eye.  Denies FOL, floater, metamorphopsia, blind spots.  Soothe XP PFAT prn OU.      Last edited by Bernarda Caffey, MD on 03/11/2021 11:59 PM.    Pt is here for decreased VA OD at near and intermediate vision, which started Friday while at work, she states Saturday she started having pain OD as well, she states she felt a pulling and tugging sensation OD as well, symptoms started to resolve yesterday around noon   Referring physician: Howard Pouch A, DO [address] [place],  [place] [address]  HISTORICAL INFORMATION:   Selected notes from the [facility] Referred by ED for possible RD   CURRENT MEDICATIONS: No current outpatient medications on file. (Ophthalmic Drugs)   No current facility-administered medications for this visit. (Ophthalmic Drugs)   Current Outpatient Medications (Other)  Medication Sig   Biotin 1000 MCG CHEW biotin 1,000 mcg chewable tablet  Take by oral route.   Cholecalciferol (VITAMIN D3) 125 MCG (5000 UT) CAPS Take 1 capsule (5,000 Units total) by mouth 3 (three) times a week.    Coenzyme Q10-Vitamin E (COQ10-VITAMIN E) 100-10 MG-UNIT CAPS Take 1 capsule by mouth. Takes 3 x week   Magnesium Citrate (CITROMA PO) 3 x week   OVER THE COUNTER MEDICATION Neoceol  collagen liquid .05 fluid ounce per 1 tablespoon takes 5-6 x week   Phytonadione (MEPHYTON PO) vitamin K   No current facility-administered medications for this visit. (Other)   REVIEW OF SYSTEMS: ROS   Positive for: Gastrointestinal, HENT, Eyes Negative for: Constitutional, Neurological, Skin, Genitourinary, Musculoskeletal, Endocrine, Cardiovascular, Respiratory, Psychiatric, Allergic/Imm, Heme/Lymph Last edited by Matthew Folks, COA on 03/10/2021  8:40 AM.     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   GERD (gastroesophageal reflux disease)    Hearing loss in right ear    Unknown cause, had full work up with Specialist in [place]   History of chicken pox    as child per pt   Pneumonia    20  yrs ago per pt on 08-09-2020   Retinal detachment    right eye 07-2017 per pt   Wears glasses    Past Surgical History:  Procedure Laterality Date   ABDOMINAL EXPLORATION SURGERY  20 yrs ago per pt on 08-09-2020   ABLATION     uterine, 1=9 to 10 yrs ago per pt on 08-09-2020   ANAL FISTULOTOMY N/A 08/15/2020   Procedure: FISTULOTOMY;  Surgeon: Leighton Ruff, MD;  Location: [facility];  Service: General;  Laterality: N/A;   CATARACT EXTRACTION Right 11/23/2017   Dr. Herbert Deaner   CATARACT EXTRACTION Left 01/2020   Dr. Herbert Deaner   DENTAL SURGERY     root canal 2020, tooth extraction 17 yrs ago as of 08-09-2020   EYE SURGERY Right 11/23/2017   Cat Sx - Dr. Herbert Deaner   EYE SURGERY Left 01/2020   Cat Sx - Dr. Herbert Deaner   EYE SURGERY Right 08/09/2017   SBP+PPV - Dr. Bernarda Caffey   GAS INSERTION Right 08/09/2017   Procedure: INSERTION OF GAS;   Surgeon: Bernarda Caffey, MD;  Location: [place];  Service: Ophthalmology;  Laterality: Right;   LASER PHOTO ABLATION Left 08/09/2017   Procedure: LASER PHOTO ABLATION;  Surgeon: Bernarda Caffey, MD;  Location: [place];  Service: Ophthalmology;  Laterality: Left;   miscarriage     210 yrs ago per on on 08-09-2020   RECTAL EXAM UNDER ANESTHESIA N/A 08/15/2020   Procedure: EXAM UNDER ANESTHESIA;  Surgeon: Leighton Ruff, MD;  Location: [facility];  Service: General;  Laterality: N/A;   RETINAL DETACHMENT SURGERY Right 08/09/2017   SBP+PPV - Dr. Bernarda Caffey   SCLERAL BUCKLE WITH POSSIBLE 25 GAUGE PARS PLANA VITRECTOMY Right 08/09/2017   Procedure: SCLERAL BUCKLE WITH 25 GAUGE PARS PLANA VITRECTOMY AND ENDOLASER;  Surgeon: Bernarda Caffey, MD;  Location: [place];  Service: Ophthalmology;  Laterality: Right;   YAG LASER APPLICATION Right 68/34/1962   FAMILY HISTORY Family History  Problem Relation Age of Onset   Diabetes Mother    Prostate cancer Father    Hypertension Father    Retinal detachment Father    Lung cancer Paternal Grandfather    Amblyopia Neg Hx    Blindness Neg Hx    Cataracts Neg Hx    Glaucoma Neg Hx    Macular degeneration Neg Hx    Strabismus Neg Hx    Retinitis pigmentosa Neg Hx    SOCIAL HISTORY Social History   Tobacco Use   Smoking status: Never   Smokeless tobacco: Never  Vaping Use   Vaping Use: Never used  Substance Use Topics   Alcohol use: Yes    Comment: rare monthly   Drug use: No       OPHTHALMIC EXAM:  Base Eye Exam     Visual Acuity (Snellen - Linear)       Right Left   Dist cc 20/10 -2 20/15 -2    Correction: Glasses         Tonometry (Tonopen, 8:44 AM)       Right Left   Pressure 11 11         Pupils       Dark Light Shape React APD   Right 3 2 Round Brisk None   Left 3 2 Round Brisk None         Visual Fields (Counting fingers)       Left Right    Full Full         Extraocular Movement       Right  Left    Full, Ortho Full, Ortho         Neuro/Psych     Oriented x3: Yes   Mood/Affect: Normal  Dilation     Both eyes: 1.0% Mydriacyl, 2.5% Phenylephrine @ 8:44 AM           Slit Lamp and Fundus Exam     Slit Lamp Exam       Right Left   Lids/Lashes Mild Dermatochalasis - upper lid, mild Meibomian gland dysfunction Mild Dermatochalasis - upper lid, mild Meibomian gland dysfunction   Conjunctiva/Sclera White and quiet White and quiet   Cornea 1+ inferior Punctate epithelial erosions, Well healed cataract wound at 1030 Trace Punctate epithelial erosions   Anterior Chamber Deep and quiet Deep and quiet   Iris Round and well dilated Round and well dilated   Lens PC IOL in good position; open PC PC IOL in good position, trace PCO   Anterior Vitreous Post vitrectomy; mild pigment Vitreous syneresis, Posterior vitreous detachment         Fundus Exam       Right Left   Disc Pink and Sharp, mildly Tilted disc, temporal PPA, +SVP Pink and Sharp, mildly Tilted disc, mild temporal Peripapillary atrophy, Compact   C/D Ratio 0.3 0.4   Macula Flat, good foveal reflex, epiretinal membrane with striae, mild RPE mottling, No heme or edema Flat, blunted foveal reflex, mild Retinal pigment epithelial mottling, No heme or edema, trace ERM nasal macula   Vessels attenuated, mild tortuousity Vascular attenuation, Tortuous, mild copper wiring   Periphery Retina attached, good buckle height, good laser 360 on buckle and surrounding breaks, mild fibrosis over buckle at 10:30 -- stable from prior Attached, pigmented Lattice degeneration with atrophic holes at 0430; lattice at 1200 with atrophic break; good laser in place surrounding all lesions, no new RT/RD or lattice           IMAGING AND PROCEDURES  Imaging and Procedures for _0 @  OCT, Retina - OU - Both Eyes       Right Eye Quality was good. Central Foveal Thickness: 355. Progression has worsened. Findings include no  SRF, epiretinal membrane, myopic contour, no IRF, abnormal foveal contour, macular pucker (Mild progression of ERM and pucker).   Left Eye Quality was good. Central Foveal Thickness: 282. Progression has improved. Findings include normal foveal contour, no IRF, no SRF (mild ERM nasal macula - stable).   Notes *Images captured and stored on drive  Diagnosis / Impression:  OD: Mild progression of ERM and pucker OS: mild ERM nasal macula - stable  Clinical management:  See below  Abbreviations: NFP - Normal foveal profile. CME - cystoid macular edema. PED - pigment epithelial detachment. IRF - intraretinal fluid. SRF - subretinal fluid. EZ - ellipsoid zone. ERM - epiretinal membrane. ORA - outer retinal atrophy. ORT - outer retinal tubulation. SRHM - subretinal hyper-reflective material             ASSESSMENT/PLAN:    ICD-10-CM   1. RD (retinal detachment), right  H33.21     2. Epiretinal membrane (ERM) of right eye  H35.371 OCT, Retina - OU - Both Eyes    3. Lattice degeneration of both retinas  H35.413     4. 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 Posterior vitreous detachment of left eye  H43.812     6. Pseudophakia, both eyes  Z96.1      **Pt presents acutely for subjective decrease in vision w/ pain OD**  - onset Friday while at work  - persisted over weekend, but improved back to baseline this morning  - symptoms  and exam consistent with mild dry eye - recommend artificial tears and lubricating ointment as needed  - f/u as scheduled in June  1. Rhegmatogenous retinal detachment, RIGHT eye  - bullous superotemporal mac on detachment, onset of foveal involvement Saturday, 01/30/17 by history  - detached from 1030-1200 oclock, large horseshoe tear from 1030-1100 and linear tear at 1130 within bed of lattice  - s/p SBP + PPV/EL/FAX/14% C3F8 OD, 06.10.19             - retina attached and in good position -- good buckle height and laser around  breaks  - BCVA 20/20 OD             - IOP OK today at 11  2.  Mild ERM OU  - persistent ERM w/ early pucker -- stable  - asymptomatic, no metamorphopsia  - BCVA 20/10 OD, 20/15 OS  - no indication for surgery at this time  - monitor for now  - f/u in 6-9 months, sooner prn, DFE, OCT  3,4. Lattice degeneration OU;  w/ atrophic breaks left eye  - OD - superotemporal lattice at site of breaks and detachment  - OS - patches of lattice at 12 and 430 with a single atrophic breaks at each site  - s/p laser retinopexy OS at time of surgery   - good laser in place  - no new RT/RD or lattice OU  - monitor  5. Sub-acute PVD OS  **pt presented acutely on 2.23.22 for persistent flashes of light and floaters OS x1 day**   - hemorrhagic PVD OS  - today symptoms improved -- decreased floaters, no photopsias  - BCVA 20/20 OS -- stable  - Discussed findings and prognosis  - No RT or RD on 360 scleral depressed exam  - Reviewed s/s of RT/RD  - Strict return precautions for any such RT/RD signs/symptoms  6. Pseudophakia OU  - cataract OD progressively worsened following PPV  - s/p CE/IOL OD by expert surgeon Dr. Herbert Deaner on 09.24.19             - s/p CE/IOL OS by expert surgeon Dr. Herbert Deaner [phone]  - beautiful surgeries; new glasses on order  - BCVA 20/20 OU             - s/p yag cap OD (04.14.21)--excellent opening  Ophthalmic Meds Ordered this visit:  No orders of the defined types were placed in this encounter.    Return for f/u as scheduled in June.  There are no Patient Instructions on file for this visit.  Explained the diagnoses, plan, and follow up with the patient and they expressed understanding.  Patient expressed understanding of the importance of proper follow up care.   This document serves as a record of services personally performed by Gardiner Sleeper, MD, PhD. It was created on their behalf by San Jetty. Owens Shark, OA an ophthalmic technician. The creation of this record is the  provider's dictation and/or activities during the visit.    Electronically signed by: San Jetty. Owens Shark, [place] 01.09.2023 12:00 AM   Gardiner Sleeper, M.D., Ph.D. Diseases & Surgery of the Retina and Vitreous [facility]  I have reviewed the above documentation for accuracy and completeness, and I agree with the above. Gardiner Sleeper, M.D., Ph.D. 03/12/21 12:04 AM  Abbreviations: M myopia (nearsighted); A astigmatism; H hyperopia (farsighted); P presbyopia; Mrx spectacle prescription;  CTL contact lenses; OD right eye; OS left eye; OU both  eyes  XT exotropia; ET esotropia; PEK punctate epithelial keratitis; PEE punctate epithelial erosions; DES dry eye syndrome; MGD meibomian gland dysfunction; ATs artificial tears; PFAT's preservative free artificial tears; [place] nuclear sclerotic cataract; PSC posterior subcapsular cataract; ERM epi-retinal membrane; PVD posterior vitreous detachment; RD retinal detachment; DM diabetes mellitus; DR diabetic retinopathy; NPDR non-proliferative diabetic retinopathy; PDR proliferative diabetic retinopathy; CSME clinically significant macular edema; DME diabetic macular edema; dbh dot blot hemorrhages; CWS cotton wool spot; POAG primary open angle glaucoma; C/D cup-to-disc ratio; HVF humphrey visual field; GVF goldmann visual field; OCT optical coherence tomography; IOP intraocular pressure; BRVO Branch retinal vein occlusion; CRVO central retinal vein occlusion; CRAO central retinal artery occlusion; BRAO branch retinal artery occlusion; RT retinal tear; SB scleral buckle; PPV pars plana vitrectomy; VH Vitreous hemorrhage; PRP panretinal laser photocoagulation; IVK intravitreal kenalog; VMT vitreomacular traction; MH Macular hole;  NVD neovascularization of the disc; NVE neovascularization elsewhere; AREDS age related eye disease study; ARMD age related macular degeneration; POAG primary open angle glaucoma; EBMD epithelial/anterior basement membrane  dystrophy; ACIOL anterior chamber intraocular lens; IOL intraocular lens; PCIOL posterior chamber intraocular lens; Phaco/IOL phacoemulsification with intraocular lens placement; [place] photorefractive keratectomy; LASIK laser assisted in situ keratomileusis; HTN hypertension; DM diabetes mellitus; COPD chronic obstructive pulmonary disease

## 2021-03-11 ENCOUNTER — Encounter (INDEPENDENT_AMBULATORY_CARE_PROVIDER_SITE_OTHER): Payer: Self-pay | Admitting: Ophthalmology

## 2021-03-27 DIAGNOSIS — H33011 Retinal detachment with single break, right eye: Secondary | ICD-10-CM | POA: Diagnosis not present

## 2021-08-04 NOTE — Progress Notes (Deleted)
[facility] Note  08/06/2021     CHIEF COMPLAINT Patient presents for No chief complaint on file.   HISTORY OF PRESENT ILLNESS: Gail Duncan is a 56 y.o. female who presents to the clinic today for:     Referring physician: Howard Pouch A, DO [address] [place],  [place] [address]  HISTORICAL INFORMATION:   Selected notes from the MEDICAL RECORD NUMBER Referred by ED for possible RD   CURRENT MEDICATIONS: No current outpatient medications on file. (Ophthalmic Drugs)   No current facility-administered medications for this visit. (Ophthalmic Drugs)   Current Outpatient Medications (Other)  Medication Sig   Biotin 1000 MCG CHEW biotin 1,000 mcg chewable tablet  Take by oral route.   Cholecalciferol (VITAMIN D3) 125 MCG (5000 UT) CAPS Take 1 capsule (5,000 Units total) by mouth 3 (three) times a week.   Coenzyme Q10-Vitamin E (COQ10-VITAMIN E) 100-10 MG-UNIT CAPS Take 1 capsule by mouth. Takes 3 x week   Magnesium Citrate (CITROMA PO) 3 x week   OVER THE COUNTER MEDICATION Neoceol  collagen liquid .05 fluid ounce per 1 tablespoon takes 5-6 x week   Phytonadione (MEPHYTON PO) vitamin K   No current facility-administered medications for this visit. 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   GERD (gastroesophageal reflux disease)    Hearing loss in right ear    Unknown cause, had full work up with Specialist [place]   History of chicken pox    as child per pt   Pneumonia    20  yrs ago per pt on 08-09-2020   Retinal detachment    right eye 07-2017 per pt   Wears glasses    Past Surgical History:  Procedure Laterality Date   ABDOMINAL EXPLORATION SURGERY     20 yrs ago per pt on 08-09-2020   ABLATION     uterine, 1=9 to 10 yrs ago per pt on  08-09-2020   ANAL FISTULOTOMY N/A 08/15/2020   Procedure: FISTULOTOMY;  Surgeon: Leighton Ruff, MD;  [facility];  Service: General;  Laterality: N/A;   CATARACT EXTRACTION Right 11/23/2017   Dr. Herbert Deaner   CATARACT EXTRACTION Left 01/2020   Dr. Herbert Deaner   DENTAL SURGERY     root canal 2020, tooth extraction 17 yrs ago as of 08-09-2020   EYE SURGERY Right 11/23/2017   Cat Sx - Dr. Herbert Deaner   EYE SURGERY Left 01/2020   Cat Sx - Dr. Herbert Deaner   EYE SURGERY Right 08/09/2017   SBP+PPV - Dr. Bernarda Caffey   GAS INSERTION Right 08/09/2017   Procedure: INSERTION OF GAS;  Surgeon: Bernarda Caffey, MD;  Location: [place];  Service: Ophthalmology;  Laterality: Right;   LASER PHOTO ABLATION Left 08/09/2017   Procedure: LASER PHOTO ABLATION;  Surgeon: Bernarda Caffey, MD;  Location: [facility];  Service: Ophthalmology;  Laterality: Left;   miscarriage     210 yrs ago per on on 08-09-2020   RECTAL EXAM UNDER ANESTHESIA N/A 08/15/2020   Procedure: EXAM UNDER ANESTHESIA;  Surgeon: Leighton Ruff, MD;  Location: [facility];  Service: General;  Laterality: N/A;   RETINAL DETACHMENT SURGERY Right 08/09/2017   SBP+PPV - Dr. Bernarda Caffey   SCLERAL BUCKLE WITH POSSIBLE 25  GAUGE PARS PLANA VITRECTOMY Right 08/09/2017   Procedure: SCLERAL BUCKLE WITH 25 GAUGE PARS PLANA VITRECTOMY AND ENDOLASER;  Surgeon: Bernarda Caffey, MD;  Location: [facility];  Service: Ophthalmology;  Laterality: Right;   YAG LASER APPLICATION Right 13/09/6576   FAMILY HISTORY Family History  Problem Relation Age of Onset   Diabetes Mother    Prostate cancer Father    Hypertension Father    Retinal detachment Father    Lung cancer Paternal Grandfather    Amblyopia Neg Hx    Blindness Neg Hx    Cataracts Neg Hx    Glaucoma Neg Hx    Macular degeneration Neg Hx    Strabismus Neg Hx    Retinitis pigmentosa Neg Hx    SOCIAL HISTORY Social History   Tobacco Use   Smoking status: Never   Smokeless tobacco: Never   Vaping Use   Vaping Use: Never used  Substance Use Topics   Alcohol use: Yes    Comment: rare monthly   Drug use: No       OPHTHALMIC EXAM:  Not recorded    IMAGING AND PROCEDURES  Imaging and Procedures for _0 @          ASSESSMENT/PLAN:  No diagnosis found.  **Pt presents acutely for subjective decrease in vision w/ pain OD**  - onset Friday while at work  - persisted over weekend, but improved back to baseline this morning  - symptoms and exam consistent with mild dry eye - recommend artificial tears and lubricating ointment as needed  - f/u as scheduled in June  1. Rhegmatogenous retinal detachment, RIGHT eye  - bullous superotemporal mac on detachment, onset of foveal involvement Saturday, 01/30/17 by history  - detached from 1030-1200 oclock, large horseshoe tear from 1030-1100 and linear tear at 1130 within bed of lattice  - s/p SBP + PPV/EL/FAX/14% C3F8 OD, 06.10.19             - retina attached and in good position -- good buckle height and laser around breaks  - BCVA 20/20 OD             -IOP OK today at 11  2.  Mild ERM OU  - persistent ERM w/ early pucker -- stable  - asymptomatic, no metamorphopsia  - BCVA 20/10 OD, 20/15 OS  - no indication for surgery at this time  - monitor for now  - f/u in 6-9 months, sooner prn, DFE, OCT  3,4. Lattice degeneration OU;  w/ atrophic breaks left eye  - OD - superotemporal lattice at site of breaks and detachment  - OS - patches of lattice at 12 and 430 with a single atrophic breaks at each site  - s/p laser retinopexy OS at time of surgery   - good laser in place  - no new RT/RD or lattice OU  - monitor  5. Sub-acute PVD OS  **pt presented acutely on 2.23.22 for persistent flashes of light and floaters OS x1 day**   - hemorrhagic PVD OS  - today symptoms improved -- decreased floaters, no photopsias  - BCVA 20/20 OS -- stable  - Discussed findings and prognosis  - No RT or RD on 360 scleral depressed  exam  - Reviewed s/s of RT/RD   - Strict return precautions for any such RT/RD signs/symptoms  6. Pseudophakia OU  - cataract OD progressively worsened following PPV  - s/p CE/IOL OD by expert surgeon Dr. Herbert Deaner on 09.24.19             -  s/p CE/IOL OS by expert surgeon Dr. Herbert Deaner [phone]  - beautiful surgeries; new glasses on order  - BCVA 20/20 OU             - s/p yag cap OD (04.14.21)--excellent opening  Ophthalmic Meds Ordered this visit:  No orders of the defined types were placed in this encounter.    No follow-ups on file.  There are no Patient Instructions on file for this visit.  Explained the diagnoses, plan, and follow up with the patient and they expressed understanding.  Patient expressed understanding of the importance of proper follow up care.   This document serves as a record of services personally performed by Gardiner Sleeper, MD, PhD. It was created on their behalf by Orvan Falconer, an ophthalmic technician. The creation of this record is the provider's dictation and/or activities during the visit.    Electronically signed by: Orvan Falconer, [place], 08/04/21  12:34 PM    Gardiner Sleeper, M.D., Ph.D. Diseases & Surgery of the Retina and Vitreous [facility]  I have reviewed the above documentation for accuracy and completeness, and I agree with the above. Gardiner Sleeper, M.D., Ph.D. 03/12/21 12:34 PM  Abbreviations: M myopia (nearsighted); A astigmatism; H hyperopia (farsighted); P presbyopia; Mrx spectacle prescription;  CTL contact lenses; OD right eye; OS left eye; OU both eyes  XT exotropia; ET esotropia; PEK punctate epithelial keratitis; PEE punctate epithelial erosions; DES dry eye syndrome; MGD meibomian gland dysfunction; ATs artificial tears; PFAT's preservative free artificial tears; [place] nuclear sclerotic cataract; PSC posterior subcapsular cataract; ERM epi-retinal membrane; PVD posterior vitreous detachment; RD retinal  detachment; DM diabetes mellitus; DR diabetic retinopathy; NPDR non-proliferative diabetic retinopathy; PDR proliferative diabetic retinopathy; CSME clinically significant macular edema; DME diabetic macular edema; dbh dot blot hemorrhages; CWS cotton wool spot; POAG primary open angle glaucoma; C/D cup-to-disc ratio; HVF humphrey visual field; GVF goldmann visual field; OCT optical coherence tomography; IOP intraocular pressure; BRVO Branch retinal vein occlusion; CRVO central retinal vein occlusion; CRAO central retinal artery occlusion; BRAO branch retinal artery occlusion; RT retinal tear; SB scleral buckle; PPV pars plana vitrectomy; VH Vitreous hemorrhage; PRP panretinal laser photocoagulation; IVK intravitreal kenalog; VMT vitreomacular traction; MH Macular hole;  NVD neovascularization of the disc; NVE neovascularization elsewhere; AREDS age related eye disease study; ARMD age related macular degeneration; POAG primary open angle glaucoma; EBMD epithelial/anterior basement membrane dystrophy; ACIOL anterior chamber intraocular lens; IOL intraocular lens; PCIOL posterior chamber intraocular lens; Phaco/IOL phacoemulsification with intraocular lens placement; [place] photorefractive keratectomy; LASIK laser assisted in situ keratomileusis; HTN hypertension; DM diabetes mellitus; COPD chronic obstructive pulmonary disease

## 2021-08-06 ENCOUNTER — Ambulatory Visit (INDEPENDENT_AMBULATORY_CARE_PROVIDER_SITE_OTHER): Payer: Federal, State, Local not specified - PPO | Admitting: Ophthalmology

## 2021-08-06 ENCOUNTER — Encounter (INDEPENDENT_AMBULATORY_CARE_PROVIDER_SITE_OTHER): Payer: Self-pay | Admitting: Ophthalmology

## 2021-08-06 DIAGNOSIS — H33332 Multiple defects of retina without detachment, left eye: Secondary | ICD-10-CM

## 2021-08-06 DIAGNOSIS — H35371 Puckering of macula, right eye: Secondary | ICD-10-CM | POA: Diagnosis not present

## 2021-08-06 DIAGNOSIS — H3321 Serous retinal detachment, right eye: Secondary | ICD-10-CM

## 2021-08-06 DIAGNOSIS — H43812 Vitreous degeneration, left eye: Secondary | ICD-10-CM | POA: Diagnosis not present

## 2021-08-06 DIAGNOSIS — H35413 Lattice degeneration of retina, bilateral: Secondary | ICD-10-CM

## 2021-08-06 DIAGNOSIS — Z961 Presence of intraocular lens: Secondary | ICD-10-CM

## 2021-08-06 NOTE — Progress Notes (Signed)
[facility] Note  08/06/2021     CHIEF COMPLAINT Patient presents for Retina Follow Up   HISTORY OF PRESENT ILLNESS: Gail Duncan is a 56 y.o. female who presents to the clinic today for:  HPI     Retina Follow Up   Patient presents with  Other.  In both eyes.  This started 5 months ago.  I, the attending physician,  performed the HPI with the patient and updated documentation appropriately.        Comments   Patient here for 5 months retina follow up for ERM OU. Patient states vision doing good. No eye pain.       Last edited by Bernarda Caffey, MD on 08/09/2021  1:41 AM.        Referring physician: Ma Hillock, DO [address] [place],  [place] [address]  HISTORICAL INFORMATION:   Selected notes from the MEDICAL RECORD NUMBER Referred by ED for possible RD   CURRENT MEDICATIONS: No current outpatient medications on file. (Ophthalmic Drugs)   No current facility-administered medications for this visit. (Ophthalmic Drugs)   Current Outpatient Medications (Other)  Medication Sig   Biotin 1000 MCG CHEW biotin 1,000 mcg chewable tablet  Take by oral route.   Cholecalciferol (VITAMIN D3) 125 MCG (5000 UT) CAPS Take 1 capsule (5,000 Units total) by mouth 3 (three) times a week.   Coenzyme Q10-Vitamin E (COQ10-VITAMIN E) 100-10 MG-UNIT CAPS Take 1 capsule by mouth. Takes 3 x week   Magnesium Citrate (CITROMA PO) 3 x week   OVER THE COUNTER MEDICATION Neoceol  collagen liquid .05 fluid ounce per 1 tablespoon takes 5-6 x week   Phytonadione (MEPHYTON PO) vitamin K   No current facility-administered medications for this visit. (Other)   REVIEW OF SYSTEMS: ROS   Positive for: Gastrointestinal, HENT, Eyes Negative for: Constitutional, Neurological, Skin, Genitourinary, Musculoskeletal, Endocrine, Cardiovascular, Respiratory, Psychiatric, Allergic/Imm, Heme/Lymph Last edited by Theodore Demark, COA on 08/06/2021  3:08 PM.      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   GERD (gastroesophageal reflux disease)    Hearing loss in right ear    Unknown cause, had full work up with Specialist in [place]   History of chicken pox    as child per pt   Pneumonia    20  yrs ago per pt on 08-09-2020   Retinal detachment    right eye 07-2017 per pt   Wears glasses    Past Surgical History:  Procedure Laterality Date   ABDOMINAL EXPLORATION SURGERY     20 yrs ago per pt on 08-09-2020   ABLATION     uterine, 1=9 to 10 yrs ago per pt on 08-09-2020   ANAL FISTULOTOMY N/A 08/15/2020   Procedure: FISTULOTOMY;  Surgeon: Leighton Ruff, MD;  Location[facility];  Service: General;  Laterality: N/A;   CATARACT EXTRACTION Right 11/23/2017   Dr. Herbert Deaner   CATARACT EXTRACTION Left 01/2020   Dr. Herbert Deaner   DENTAL SURGERY     root canal 2020, tooth extraction 17 yrs ago as of 08-09-2020   EYE SURGERY Right 11/23/2017   Cat Sx - Dr. Herbert Deaner   EYE SURGERY Left 01/2020   Cat Sx - Dr. Herbert Deaner   EYE SURGERY Right 08/09/2017   SBP+PPV -  Dr. Bernarda Caffey   GAS INSERTION Right 08/09/2017   Procedure: INSERTION OF GAS;  Surgeon: Bernarda Caffey, MD;  Location: [place];  Service: Ophthalmology;  Laterality: Right;   LASER PHOTO ABLATION Left 08/09/2017   Procedure: LASER PHOTO ABLATION;  Surgeon: Bernarda Caffey, MD;  Location: [place];  Service: Ophthalmology;  Laterality: Left;   miscarriage     210 yrs ago per on on 08-09-2020   RECTAL EXAM UNDER ANESTHESIA N/A 08/15/2020   Procedure: EXAM UNDER ANESTHESIA;  Surgeon: Leighton Ruff, MD;  Location: [facility];  Service: General;  Laterality: N/A;   RETINAL DETACHMENT SURGERY Right 08/09/2017   SBP+PPV - Dr. Bernarda Caffey   SCLERAL BUCKLE WITH POSSIBLE 25 GAUGE PARS PLANA VITRECTOMY Right 08/09/2017    Procedure: SCLERAL BUCKLE WITH 25 GAUGE PARS PLANA VITRECTOMY AND ENDOLASER;  Surgeon: Bernarda Caffey, MD;  Location: [place];  Service: Ophthalmology;  Laterality: Right;   YAG LASER APPLICATION Right 80/05/4915   FAMILY HISTORY Family History  Problem Relation Age of Onset   Diabetes Mother    Prostate cancer Father    Hypertension Father    Retinal detachment Father    Lung cancer Paternal Grandfather    Amblyopia Neg Hx    Blindness Neg Hx    Cataracts Neg Hx    Glaucoma Neg Hx    Macular degeneration Neg Hx    Strabismus Neg Hx    Retinitis pigmentosa Neg Hx    SOCIAL HISTORY Social History   Tobacco Use   Smoking status: Never   Smokeless tobacco: Never  Vaping Use   Vaping Use: Never used  Substance Use Topics   Alcohol use: Yes    Comment: rare monthly   Drug use: No       OPHTHALMIC EXAM:  Base Eye Exam     Visual Acuity (Snellen - Linear)       Right Left   Dist cc 20/20 20/20    Correction: Glasses         Tonometry (Tonopen, 3:07 PM)       Right Left   Pressure 15 14         Pupils       Dark Light Shape React APD   Right 3 2 Round Brisk None   Left 3 2 Round Brisk None         Visual Fields (Counting fingers)       Left Right    Full Full         Extraocular Movement       Right Left    Full, Ortho Full, Ortho         Neuro/Psych     Oriented x3: Yes   Mood/Affect: Normal         Dilation     Both eyes: 1.0% Mydriacyl, 2.5% Phenylephrine @ 3:07 PM           Slit Lamp and Fundus Exam     Slit Lamp Exam       Right Left   Lids/Lashes Mild Dermatochalasis - upper lid, mild Meibomian gland dysfunction Mild Dermatochalasis - upper lid, mild Meibomian gland dysfunction   Conjunctiva/Sclera White and quiet White and quiet   Cornea 2+ inferior Punctate epithelial erosions, Well healed cataract wound at 1030 Trace Punctate epithelial erosions, Mild debris in tear film   Anterior Chamber Deep and clear Deep and  clear   Iris Round and well dilated Round and well dilated  Lens PC IOL in good position; open PC PC IOL in good position, 1+ PCO   Anterior Vitreous Post vitrectomy Vitreous syneresis, Posterior vitreous detachment         Fundus Exam       Right Left   Disc Pink and Sharp, mildly Tilted disc, temporal PPA Pink and Sharp, mildly Tilted disc, mild temporal Peripapillary atrophy, Compact   C/D Ratio 0.3 0.4   Macula Flat, good foveal reflex, epiretinal membrane with striae, mild RPE mottling, mild central thickening, No heme Flat, blunted foveal reflex, mild Retinal pigment epithelial mottling, No heme or edema, trace ERM nasal macula   Vessels attenuated, mild tortuousity Vascular attenuation, Tortuous, mild copper wiring   Periphery Retina attached, good buckle height, good laser 360 on buckle and surrounding breaks, mild fibrosis over buckle at 10:30 -- stable from prior Attached, pigmented Lattice degeneration with atrophic holes at 0430; lattice at 1200 with atrophic break; good laser in place surrounding all lesions, no new RT/RD or lattice           Refraction     Wearing Rx       Sphere Cylinder Axis Add   Right -0.50 Sphere  +1.50   Left -0.75 +0.75 146 +1.50    Type: SVL           IMAGING AND PROCEDURES  Imaging and Procedures for _0 @  OCT, Retina - OU - Both Eyes       Right Eye Quality was good. Central Foveal Thickness: 467. Progression has worsened. Findings include no SRF, epiretinal membrane, myopic contour, no IRF, abnormal foveal contour, macular pucker (Persistent ERM and pucker with interval increase in central thickness).   Left Eye Quality was good. Central Foveal Thickness: 281. Progression has been stable. Findings include normal foveal contour, no IRF, no SRF (mild ERM nasal macula - stable).   Notes *Images captured and stored on drive  Diagnosis / Impression:  OD: Persistent ERM and pucker with interval increase in central  thickness OS: mild ERM nasal macula - stable  Clinical management:  See below  Abbreviations: NFP - Normal foveal profile. CME - cystoid macular edema. PED - pigment epithelial detachment. IRF - intraretinal fluid. SRF - subretinal fluid. EZ - ellipsoid zone. ERM - epiretinal membrane. ORA - outer retinal atrophy. ORT - outer retinal tubulation. SRHM - subretinal hyper-reflective material             ASSESSMENT/PLAN:    ICD-10-CM   1. RD (retinal detachment), right  H33.21     2. Epiretinal membrane (ERM) of right eye  H35.371 OCT, Retina - OU - Both Eyes    3. Lattice degeneration of both retinas  H35.413     4. 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 Posterior vitreous detachment of left eye  H43.812     6. Pseudophakia, both eyes  Z96.1       1. Rhegmatogenous retinal detachment, RIGHT eye  - bullous superotemporal mac on detachment, onset of foveal involvement Saturday, 01/30/17 by history  - detached from 1030-1200 oclock, large horseshoe tear from 1030-1100 and linear tear at 1130 within bed of lattice  - s/p SBP + PPV/EL/FAX/14% C3F8 OD, 06.10.19             - retina attached and in good position -- good buckle height and laser around breaks  - BCVA 20/20 OD             -  IOP OK today at 15  2.  Mild ERM OU  - persistent ERM w/ early pucker OU -- OS stable  - OD with mild progression of central thickening  - asymptomatic, no metamorphopsia  - BCVA 20/20 OU  - no indication for surgery at this time  - monitor for now  - f/u in 6 months, sooner prn, DFE, OCT  3,4. Lattice degeneration OU;  w/ atrophic breaks left eye  - OD - superotemporal lattice at site of breaks and detachment  - OS - patches of lattice at 12 and 430 with a single atrophic breaks at each site  - s/p laser retinopexy OS at time of surgery   - good laser in place  - no new RT/RD or lattice OU  - monitor  5. Sub-acute PVD OS  **pt presented acutely on 2.23.22 for  persistent flashes of light and floaters OS x1 day**   - hemorrhagic PVD OS  - today symptoms stably improved -- decreased floaters, no photopsias  - BCVA 20/20 OS -- stable  - Discussed findings and prognosis  - No RT or RD on 360 scleral depressed exam  - Reviewed s/s of RT/RD   - Strict return precautions for any such RT/RD signs/symptoms  6. Pseudophakia OU  - cataract OD progressively worsened following PPV  - s/p CE/IOL OD by expert surgeon Dr. Herbert Deaner on 09.24.19             - s/p CE/IOL OS by expert surgeon Dr. Herbert Deaner [phone]  - beautiful surgeries; new glasses on order  - BCVA 20/20 OU             - s/p yag cap OD (04.14.21)--excellent opening  Ophthalmic Meds Ordered this visit:  No orders of the defined types were placed in this encounter.    Return in about 6 months (around 02/05/2022) for DFE, OCT.  There are no Patient Instructions on file for this visit.  Explained the diagnoses, plan, and follow up with the patient and they expressed understanding.  Patient expressed understanding of the importance of proper follow up care.   This document serves as a record of services personally performed by Gardiner Sleeper, MD, PhD. It was created on their behalf by Leonie Douglas, an ophthalmic technician. The creation of this record is the provider's dictation and/or activities during the visit.    Electronically signed by: Leonie Douglas COA, 08/09/21  1:41 AM  Gardiner Sleeper, M.D., Ph.D. Diseases & Surgery of the Retina and Vitreous [facility]  I have reviewed the above documentation for accuracy and completeness, and I agree with the above. Gardiner Sleeper, M.D., Ph.D. 08/09/21 1:43 AM   Abbreviations: M myopia (nearsighted); A astigmatism; H hyperopia (farsighted); P presbyopia; Mrx spectacle prescription;  CTL contact lenses; OD right eye; OS left eye; OU both eyes  XT exotropia; ET esotropia; PEK punctate epithelial keratitis; PEE punctate  epithelial erosions; DES dry eye syndrome; MGD meibomian gland dysfunction; ATs artificial tears; PFAT's preservative free artificial tears; [place] nuclear sclerotic cataract; PSC posterior subcapsular cataract; ERM epi-retinal membrane; PVD posterior vitreous detachment; RD retinal detachment; DM diabetes mellitus; DR diabetic retinopathy; NPDR non-proliferative diabetic retinopathy; PDR proliferative diabetic retinopathy; CSME clinically significant macular edema; DME diabetic macular edema; dbh dot blot hemorrhages; CWS cotton wool spot; POAG primary open angle glaucoma; C/D cup-to-disc ratio; HVF humphrey visual field; GVF goldmann visual field; OCT optical coherence tomography; IOP intraocular pressure; BRVO Branch retinal vein occlusion;  CRVO central retinal vein occlusion; CRAO central retinal artery occlusion; BRAO branch retinal artery occlusion; RT retinal tear; SB scleral buckle; PPV pars plana vitrectomy; VH Vitreous hemorrhage; PRP panretinal laser photocoagulation; IVK intravitreal kenalog; VMT vitreomacular traction; MH Macular hole;  NVD neovascularization of the disc; NVE neovascularization elsewhere; AREDS age related eye disease study; ARMD age related macular degeneration; POAG primary open angle glaucoma; EBMD epithelial/anterior basement membrane dystrophy; ACIOL anterior chamber intraocular lens; IOL intraocular lens; PCIOL posterior chamber intraocular lens; Phaco/IOL phacoemulsification with intraocular lens placement; PRK photorefractive keratectomy; LASIK laser assisted in situ keratomileusis; HTN hypertension; DM diabetes mellitus; COPD chronic obstructive pulmonary disease 

## 2021-08-09 NOTE — Progress Notes (Signed)
[facility] Note  08/06/2021     CHIEF COMPLAINT Patient presents for Retina Follow Up   HISTORY OF PRESENT ILLNESS: Gail Duncan is a 56 y.o. female who presents to the clinic today for:  HPI     Retina Follow Up   Patient presents with  Other.  In both eyes.  This started 5 months ago.  I, the attending physician,  performed the HPI with the patient and updated documentation appropriately.        Comments   Patient here for 5 months retina follow up for ERM OU. Patient states vision doing good. No eye pain.       Last edited by Bernarda Caffey, MD on 08/09/2021  1:41 AM.        Referring physician: Ma Hillock, DO [address] [place],  [place] [address]  HISTORICAL INFORMATION:   Selected notes from the MEDICAL RECORD NUMBER Referred by ED for possible RD   CURRENT MEDICATIONS: No current outpatient medications on file. (Ophthalmic Drugs)   No current facility-administered medications for this visit. (Ophthalmic Drugs)   Current Outpatient Medications (Other)  Medication Sig   Biotin 1000 MCG CHEW biotin 1,000 mcg chewable tablet  Take by oral route.   Cholecalciferol (VITAMIN D3) 125 MCG (5000 UT) CAPS Take 1 capsule (5,000 Units total) by mouth 3 (three) times a week.   Coenzyme Q10-Vitamin E (COQ10-VITAMIN E) 100-10 MG-UNIT CAPS Take 1 capsule by mouth. Takes 3 x week   Magnesium Citrate (CITROMA PO) 3 x week   OVER THE COUNTER MEDICATION Neoceol  collagen liquid .05 fluid ounce per 1 tablespoon takes 5-6 x week   Phytonadione (MEPHYTON PO) vitamin K   No current facility-administered medications for this visit. (Other)   REVIEW OF SYSTEMS: ROS   Positive for: Gastrointestinal, HENT, Eyes Negative for: Constitutional, Neurological, Skin, Genitourinary, Musculoskeletal, Endocrine, Cardiovascular, Respiratory, Psychiatric, Allergic/Imm, Heme/Lymph Last edited by Theodore Demark, COA on 08/06/2021  3:08 PM.      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   GERD (gastroesophageal reflux disease)    Hearing loss in right ear    Unknown cause, had full work up with Specialist in [place]   History of chicken pox    as child per pt   Pneumonia    20  yrs ago per pt on 08-09-2020   Retinal detachment    right eye 07-2017 per pt   Wears glasses    Past Surgical History:  Procedure Laterality Date   ABDOMINAL EXPLORATION SURGERY     20 yrs ago per pt on 08-09-2020   ABLATION     uterine, 1=9 to 10 yrs ago per pt on 08-09-2020   ANAL FISTULOTOMY N/A 08/15/2020   Procedure: FISTULOTOMY;  Surgeon: Leighton Ruff, MD;  Location[facility];  Service: General;  Laterality: N/A;   CATARACT EXTRACTION Right 11/23/2017   Dr. Herbert Deaner   CATARACT EXTRACTION Left 01/2020   Dr. Herbert Deaner   DENTAL SURGERY     root canal 2020, tooth extraction 17 yrs ago as of 08-09-2020   EYE SURGERY Right 11/23/2017   Cat Sx - Dr. Herbert Deaner   EYE SURGERY Left 01/2020   Cat Sx - Dr. Herbert Deaner   EYE SURGERY Right 08/09/2017   SBP+PPV -  Dr. Bernarda Caffey   GAS INSERTION Right 08/09/2017   Procedure: INSERTION OF GAS;  Surgeon: Bernarda Caffey, MD;  Location: [facility];  Service: Ophthalmology;  Laterality: Right;   LASER PHOTO ABLATION Left 08/09/2017   Procedure: LASER PHOTO ABLATION;  Surgeon: Bernarda Caffey, MD;  Location: [facility];  Service: Ophthalmology;  Laterality: Left;   miscarriage     210 yrs ago per on on 08-09-2020   RECTAL EXAM UNDER ANESTHESIA N/A 08/15/2020   Procedure: EXAM UNDER ANESTHESIA;  Surgeon: Leighton Ruff, MD;  Location: [facility];  Service: General;  Laterality: N/A;   RETINAL DETACHMENT SURGERY Right 08/09/2017   SBP+PPV - Dr. Bernarda Caffey   SCLERAL BUCKLE WITH POSSIBLE 25 GAUGE PARS PLANA VITRECTOMY Right 08/09/2017    Procedure: SCLERAL BUCKLE WITH 25 GAUGE PARS PLANA VITRECTOMY AND ENDOLASER;  Surgeon: Bernarda Caffey, MD;  Location: [facility];  Service: Ophthalmology;  Laterality: Right;   YAG LASER APPLICATION Right 12/16/5100   FAMILY HISTORY Family History  Problem Relation Age of Onset   Diabetes Mother    Prostate cancer Father    Hypertension Father    Retinal detachment Father    Lung cancer Paternal Grandfather    Amblyopia Neg Hx    Blindness Neg Hx    Cataracts Neg Hx    Glaucoma Neg Hx    Macular degeneration Neg Hx    Strabismus Neg Hx    Retinitis pigmentosa Neg Hx    SOCIAL HISTORY Social History   Tobacco Use   Smoking status: Never   Smokeless tobacco: Never  Vaping Use   Vaping Use: Never used  Substance Use Topics   Alcohol use: Yes    Comment: rare monthly   Drug use: No       OPHTHALMIC EXAM:  Base Eye Exam     Visual Acuity (Snellen - Linear)       Right Left   Dist cc 20/20 20/20    Correction: Glasses         Tonometry (Tonopen, 3:07 PM)       Right Left   Pressure 15 14         Pupils       Dark Light Shape React APD   Right 3 2 Round Brisk None   Left 3 2 Round Brisk None         Visual Fields (Counting fingers)       Left Right    Full Full         Extraocular Movement       Right Left    Full, Ortho Full, Ortho         Neuro/Psych     Oriented x3: Yes   Mood/Affect: Normal         Dilation     Both eyes: 1.0% Mydriacyl, 2.5% Phenylephrine @ 3:07 PM           Slit Lamp and Fundus Exam     Slit Lamp Exam       Right Left   Lids/Lashes Mild Dermatochalasis - upper lid, mild Meibomian gland dysfunction Mild Dermatochalasis - upper lid, mild Meibomian gland dysfunction   Conjunctiva/Sclera White and quiet White and quiet   Cornea 2+ inferior Punctate epithelial erosions, Well healed cataract wound at 1030 Trace Punctate epithelial erosions, Mild debris in tear film   Anterior Chamber Deep and clear Deep and  clear   Iris Round and well dilated Round and well dilated  Lens PC IOL in good position; open PC PC IOL in good position, 1+ PCO   Anterior Vitreous Post vitrectomy Vitreous syneresis, Posterior vitreous detachment         Fundus Exam       Right Left   Disc Pink and Sharp, mildly Tilted disc, temporal PPA Pink and Sharp, mildly Tilted disc, mild temporal Peripapillary atrophy, Compact   C/D Ratio 0.3 0.4   Macula Flat, good foveal reflex, epiretinal membrane with striae, mild RPE mottling, mild central thickening, No heme Flat, blunted foveal reflex, mild Retinal pigment epithelial mottling, No heme or edema, trace ERM nasal macula   Vessels attenuated, mild tortuousity Vascular attenuation, Tortuous, mild copper wiring   Periphery Retina attached, good buckle height, good laser 360 on buckle and surrounding breaks, mild fibrosis over buckle at 10:30 -- stable from prior Attached, pigmented Lattice degeneration with atrophic holes at 0430; lattice at 1200 with atrophic break; good laser in place surrounding all lesions, no new RT/RD or lattice           Refraction     Wearing Rx       Sphere Cylinder Axis Add   Right -0.50 Sphere  +1.50   Left -0.75 +0.75 146 +1.50    Type: SVL           IMAGING AND PROCEDURES  Imaging and Procedures for _0 @  OCT, Retina - OU - Both Eyes       Right Eye Quality was good. Central Foveal Thickness: 467. Progression has worsened. Findings include no SRF, epiretinal membrane, myopic contour, no IRF, abnormal foveal contour, macular pucker (Persistent ERM and pucker with interval increase in central thickness).   Left Eye Quality was good. Central Foveal Thickness: 281. Progression has been stable. Findings include normal foveal contour, no IRF, no SRF (mild ERM nasal macula - stable).   Notes *Images captured and stored on drive  Diagnosis / Impression:  OD: Persistent ERM and pucker with interval increase in central  thickness OS: mild ERM nasal macula - stable  Clinical management:  See below  Abbreviations: NFP - Normal foveal profile. CME - cystoid macular edema. PED - pigment epithelial detachment. IRF - intraretinal fluid. SRF - subretinal fluid. EZ - ellipsoid zone. ERM - epiretinal membrane. ORA - outer retinal atrophy. ORT - outer retinal tubulation. SRHM - subretinal hyper-reflective material             ASSESSMENT/PLAN:    ICD-10-CM   1. RD (retinal detachment), right  H33.21     2. Epiretinal membrane (ERM) of right eye  H35.371 OCT, Retina - OU - Both Eyes    3. Lattice degeneration of both retinas  H35.413     4. 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 Posterior vitreous detachment of left eye  H43.812     6. Pseudophakia, both eyes  Z96.1       1. Rhegmatogenous retinal detachment, RIGHT eye  - bullous superotemporal mac on detachment, onset of foveal involvement Saturday, 01/30/17 by history  - detached from 1030-1200 oclock, large horseshoe tear from 1030-1100 and linear tear at 1130 within bed of lattice  - s/p SBP + PPV/EL/FAX/14% C3F8 OD, 06.10.19             - retina attached and in good position -- good buckle height and laser around breaks  - BCVA 20/20 OD             -  IOP OK today at 15  2.  Mild ERM OU  - persistent ERM w/ early pucker OU -- OS stable  - OD with mild progression of central thickening  - asymptomatic, no metamorphopsia  - BCVA 20/20 OU  - no indication for surgery at this time  - monitor for now  - f/u in 6 months, sooner prn, DFE, OCT  3,4. Lattice degeneration OU;  w/ atrophic breaks left eye  - OD - superotemporal lattice at site of breaks and detachment  - OS - patches of lattice at 12 and 430 with a single atrophic breaks at each site  - s/p laser retinopexy OS at time of surgery   - good laser in place  - no new RT/RD or lattice OU  - monitor  5. Sub-acute PVD OS  **pt presented acutely on 2.23.22 for  persistent flashes of light and floaters OS x1 day**   - hemorrhagic PVD OS  - today symptoms stably improved -- decreased floaters, no photopsias  - BCVA 20/20 OS -- stable  - Discussed findings and prognosis  - No RT or RD on 360 scleral depressed exam  - Reviewed s/s of RT/RD   - Strict return precautions for any such RT/RD signs/symptoms  6. Pseudophakia OU  - cataract OD progressively worsened following PPV  - s/p CE/IOL OD by expert surgeon Dr. Herbert Deaner on 09.24.19             - s/p CE/IOL OS by expert surgeon Dr. Herbert Deaner [phone]  - beautiful surgeries; new glasses on order  - BCVA 20/20 OU             - s/p yag cap OD (04.14.21)--excellent opening  Ophthalmic Meds Ordered this visit:  No orders of the defined types were placed in this encounter.    Return in about 6 months (around 02/05/2022) for DFE, OCT.  There are no Patient Instructions on file for this visit.  Explained the diagnoses, plan, and follow up with the patient and they expressed understanding.  Patient expressed understanding of the importance of proper follow up care.   This document serves as a record of services personally performed by Gardiner Sleeper, MD, PhD. It was created on their behalf by Leonie Douglas, an ophthalmic technician. The creation of this record is the provider's dictation and/or activities during the visit.    Electronically signed by: Leonie Douglas COA, 08/09/21  1:44 AM  Gardiner Sleeper, M.D., Ph.D. Diseases & Surgery of the Retina and Vitreous [facility]  I have reviewed the above documentation for accuracy and completeness, and I agree with the above. Gardiner Sleeper, M.D., Ph.D. 08/09/21 1:44 AM   Abbreviations: M myopia (nearsighted); A astigmatism; H hyperopia (farsighted); P presbyopia; Mrx spectacle prescription;  CTL contact lenses; OD right eye; OS left eye; OU both eyes  XT exotropia; ET esotropia; PEK punctate epithelial keratitis; PEE punctate  epithelial erosions; DES dry eye syndrome; MGD meibomian gland dysfunction; ATs artificial tears; PFAT's preservative free artificial tears; [place] nuclear sclerotic cataract; PSC posterior subcapsular cataract; ERM epi-retinal membrane; PVD posterior vitreous detachment; RD retinal detachment; DM diabetes mellitus; DR diabetic retinopathy; NPDR non-proliferative diabetic retinopathy; PDR proliferative diabetic retinopathy; CSME clinically significant macular edema; DME diabetic macular edema; dbh dot blot hemorrhages; CWS cotton wool spot; POAG primary open angle glaucoma; C/D cup-to-disc ratio; HVF humphrey visual field; GVF goldmann visual field; OCT optical coherence tomography; IOP intraocular pressure; BRVO Branch retinal vein occlusion;  CRVO central retinal vein occlusion; CRAO central retinal artery occlusion; BRAO branch retinal artery occlusion; RT retinal tear; SB scleral buckle; PPV pars plana vitrectomy; VH Vitreous hemorrhage; PRP panretinal laser photocoagulation; IVK intravitreal kenalog; VMT vitreomacular traction; MH Macular hole;  NVD neovascularization of the disc; NVE neovascularization elsewhere; AREDS age related eye disease study; ARMD age related macular degeneration; POAG primary open angle glaucoma; EBMD epithelial/anterior basement membrane dystrophy; ACIOL anterior chamber intraocular lens; IOL intraocular lens; PCIOL posterior chamber intraocular lens; Phaco/IOL phacoemulsification with intraocular lens placement; PRK photorefractive keratectomy; LASIK laser assisted in situ keratomileusis; HTN hypertension; DM diabetes mellitus; COPD chronic obstructive pulmonary disease 

## 2021-09-05 DIAGNOSIS — K219 Gastro-esophageal reflux disease without esophagitis: Secondary | ICD-10-CM | POA: Diagnosis not present

## 2021-11-26 DIAGNOSIS — K080 Exfoliation of teeth due to systemic causes: Secondary | ICD-10-CM | POA: Diagnosis not present

## 2021-12-02 DIAGNOSIS — H35371 Puckering of macula, right eye: Secondary | ICD-10-CM | POA: Diagnosis not present

## 2022-02-03 NOTE — Progress Notes (Signed)
[facility] Note  02/04/2022     CHIEF COMPLAINT Patient presents for Retina Follow Up   HISTORY OF PRESENT ILLNESS: Gail Duncan is a 56 y.o. female who presents to the clinic today for:  HPI     Retina Follow Up   Patient presents with  Other.  In both eyes.  This started 6 months ago.  Duration of 6 months.  I, the attending physician,  performed the HPI with the patient and updated documentation appropriately.        Comments   6 month f/u RD OD/ERM OU pt states her vision is not as clear in her right eye she denies any flashes or floaters      Last edited by Bernarda Caffey, MD on 02/04/2022  3:17 PM.     Pt states she feels VA in OD has decreased a bit the last few months.   Referring physician: Ma Hillock, DO [address] [place],  [place] [address]  HISTORICAL INFORMATION:   Selected notes from the MEDICAL RECORD NUMBER Referred by ED for possible RD   CURRENT MEDICATIONS: No current outpatient medications on file. (Ophthalmic Drugs)   No current facility-administered medications for this visit. (Ophthalmic Drugs)   Current Outpatient Medications (Other)  Medication Sig   Biotin 1000 MCG CHEW biotin 1,000 mcg chewable tablet  Take by oral route.   Cholecalciferol (VITAMIN D3) 125 MCG (5000 UT) CAPS Take 1 capsule (5,000 Units total) by mouth 3 (three) times a week.   Coenzyme Q10-Vitamin E (COQ10-VITAMIN E) 100-10 MG-UNIT CAPS Take 1 capsule by mouth. Takes 3 x week   Magnesium Citrate (CITROMA PO) 3 x week   OVER THE COUNTER MEDICATION Neoceol  collagen liquid .05 fluid ounce per 1 tablespoon takes 5-6 x week   Phytonadione (MEPHYTON PO) vitamin K   No current facility-administered medications for this visit. (Other)   REVIEW OF SYSTEMS: ROS   Positive for: Eyes Negative for: Constitutional, Gastrointestinal, Neurological, Skin, Genitourinary, Musculoskeletal, HENT, Endocrine, Cardiovascular, Respiratory, Psychiatric,  Allergic/Imm, Heme/Lymph Last edited by Bernarda Caffey, MD on 02/04/2022  3:17 PM.      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   GERD (gastroesophageal reflux disease)    Hearing loss in right ear    Unknown cause, had full work up with Specialist in [place]   History of chicken pox    as child per pt   Pneumonia    20  yrs ago per pt on 08-09-2020   Retinal detachment    right eye 07-2017 per pt   Wears glasses    Past Surgical History:  Procedure Laterality Date   ABDOMINAL EXPLORATION SURGERY     20 yrs ago per pt on 08-09-2020   ABLATION     uterine, 1=9 to 10 yrs ago per pt on 08-09-2020   ANAL FISTULOTOMY N/A 08/15/2020   Procedure: FISTULOTOMY;  Surgeon: Leighton Ruff, MD;  Location: [facility];  Service: General;  Laterality: N/A;   CATARACT EXTRACTION Right 11/23/2017   Dr. Herbert Deaner   CATARACT EXTRACTION Left 01/2020   Dr. Herbert Deaner   DENTAL SURGERY     root canal 2020, tooth extraction 17 yrs ago as of 08-09-2020   EYE SURGERY Right 11/23/2017   Cat Sx - Dr. Herbert Deaner  EYE SURGERY Left 01/2020   Cat Sx - Dr. Herbert Deaner   EYE SURGERY Right 08/09/2017   SBP+PPV - Dr. Bernarda Caffey   GAS INSERTION Right 08/09/2017   Procedure: INSERTION OF GAS;  Surgeon: Bernarda Caffey, MD;  Location: [place];  Service: Ophthalmology;  Laterality: Right;   LASER PHOTO ABLATION Left 08/09/2017   Procedure: LASER PHOTO ABLATION;  Surgeon: Bernarda Caffey, MD;  Location: [place];  Service: Ophthalmology;  Laterality: Left;   miscarriage     210 yrs ago per on on 08-09-2020   RECTAL EXAM UNDER ANESTHESIA N/A 08/15/2020   Procedure: EXAM UNDER ANESTHESIA;  Surgeon: Leighton Ruff, MD;  Location[facility];  Service: General;  Laterality: N/A;   RETINAL DETACHMENT SURGERY Right 08/09/2017   SBP+PPV - Dr. Bernarda Caffey    SCLERAL BUCKLE WITH POSSIBLE 25 GAUGE PARS PLANA VITRECTOMY Right 08/09/2017   Procedure: SCLERAL BUCKLE WITH 25 GAUGE PARS PLANA VITRECTOMY AND ENDOLASER;  Surgeon: Bernarda Caffey, MD;  Location: [place];  Service: Ophthalmology;  Laterality: Right;   YAG LASER APPLICATION Right 67/89/3810   FAMILY HISTORY Family History  Problem Relation Age of Onset   Diabetes Mother    Prostate cancer Father    Hypertension Father    Retinal detachment Father    Lung cancer Paternal Grandfather    Amblyopia Neg Hx    Blindness Neg Hx    Cataracts Neg Hx    Glaucoma Neg Hx    Macular degeneration Neg Hx    Strabismus Neg Hx    Retinitis pigmentosa Neg Hx    SOCIAL HISTORY Social History   Tobacco Use   Smoking status: Never   Smokeless tobacco: Never  Vaping Use   Vaping Use: Never used  Substance Use Topics   Alcohol use: Yes    Comment: rare monthly   Drug use: No       OPHTHALMIC EXAM:  Base Eye Exam     Visual Acuity (Snellen - Linear)       Right Left   Dist cc 20/25 -2 20/20    Correction: Glasses         Tonometry (Tonopen, 3:11 PM)       Right Left   Pressure 14 15         Pupils       Pupils Dark Light Shape React APD   Right PERRL 3 2 Round Brisk None   Left PERRL 3 2 Round Brisk None         Visual Fields       Left Right    Full Full         Extraocular Movement       Right Left    Full, Ortho Full, Ortho         Neuro/Psych     Oriented x3: Yes   Mood/Affect: Normal         Dilation     Both eyes: 2.5% Phenylephrine @ 3:11 PM           Slit Lamp and Fundus Exam     Slit Lamp Exam       Right Left   Lids/Lashes Mild Dermatochalasis - upper lid, mild Meibomian gland dysfunction Mild Dermatochalasis - upper lid, mild Meibomian gland dysfunction   Conjunctiva/Sclera White and quiet White and quiet   Cornea 1-2+ fine inferior Punctate epithelial erosions, Well healed cataract wound at 1030 Trace Punctate epithelial  erosions, Mild debris in tear film, Well  healed temporal cataract wound   Anterior Chamber Deep and clear Deep and clear   Iris Round and well dilated Round and well dilated   Lens PC IOL in good position; open PC PC IOL in good position, 1+ PCO   Anterior Vitreous Post vitrectomy clear Vitreous syneresis, Posterior vitreous detachment         Fundus Exam       Right Left   Disc Pink and Sharp, mildly Tilted disc, temporal PPA Pink and Sharp, mildly Tilted disc, mild temporal Peripapillary atrophy, Compact   C/D Ratio 0.3 0.4   Macula Flat, good foveal reflex, epiretinal membrane with striae, mild RPE mottling, mild central thickening, focal MA SN mac. Flat, blunted foveal reflex, mild Retinal pigment epithelial mottling, No heme or edema, trace ERM nasal macula   Vessels attenuated, mild tortuousity Vascular attenuation, Tortuous, mild copper wiring   Periphery Retina attached, good buckle height, good laser 360 on buckle and surrounding breaks, mild fibrosis over buckle at 10:30 -- stable from prior Attached, pigmented Lattice degeneration with atrophic holes at 0430; lattice at 1200 with atrophic break; good laser in place surrounding all lesions, no new RT/RD or lattice           Refraction     Wearing Rx       Sphere Cylinder Axis Add   Right -0.50 Sphere  +1.50   Left -0.75 +0.75 146 +1.50    Type: SVL           IMAGING AND PROCEDURES  Imaging and Procedures for _0 @  OCT, Retina - OU - Both Eyes       Right Eye Quality was good. Central Foveal Thickness: 508. Progression has been stable. Findings include no IRF, no SRF, abnormal foveal contour, myopic contour, epiretinal membrane, macular pucker (Persistent ERM and pucker with mild interval increase in central thickness).   Left Eye Quality was good. Central Foveal Thickness: 284. Progression has been stable. Findings include normal foveal contour, no IRF, no SRF (trace ERM nasal macula ).   Notes *Images  captured and stored on drive  Diagnosis / Impression:  OD: Persistent ERM and pucker with mild interval increase in central thickness OS: trace ERM nasal macula   Clinical management:  See below  Abbreviations: NFP - Normal foveal profile. CME - cystoid macular edema. PED - pigment epithelial detachment. IRF - intraretinal fluid. SRF - subretinal fluid. EZ - ellipsoid zone. ERM - epiretinal membrane. ORA - outer retinal atrophy. ORT - outer retinal tubulation. SRHM - subretinal hyper-reflective material             ASSESSMENT/PLAN:    ICD-10-CM   1. RD (retinal detachment), right  H33.21     2. Epiretinal membrane (ERM) of right eye  H35.371 OCT, Retina - OU - Both Eyes    3. Lattice degeneration of both retinas  H35.413     4. 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 Posterior vitreous detachment of left eye  H43.812     6. Pseudophakia, both eyes  Z96.1     7. Pseudophakia of right eye  Z96.1      1. Rhegmatogenous retinal detachment, RIGHT eye  - bullous superotemporal mac on detachment, onset of foveal involvement Saturday, 01/30/17 by history  - detached from 1030-1200 oclock, large horseshoe tear from 1030-1100 and linear tear at 1130 within bed of lattice  - s/p SBP + PPV/EL/FAX/14% C3F8 OD, 06.10.19             -  retina attached and in good position -- good buckle height and laser around breaks  - BCVA 20/25 OD-down slightly from 20/20             - IOP OK today at 14  2.  ERM OD  - ERM w/ pucker and central thickening OD  - OCT shows mild progression of central thickening  - asymptomatic, no metamorphopsia  - BCVA 20/25 from 20/20 OD  - no indication for surgery at this time, patient agrees.   - monitor for now   - f/u in 6 months, sooner prn, DFE, OCT  3,4. Lattice degeneration OU;  w/ atrophic breaks left eye  - OD - superotemporal lattice at site of breaks and detachment  - OS - patches of lattice at 12 and 430 with a single  atrophic breaks at each site  - s/p laser retinopexy OS at time of RD surgery (6.10.19)  - good laser in place   - no new RT/RD or lattice OU  - monitor  5. PVD OS  **pt presented acutely on 2.23.22 for persistent flashes of light and floaters OS x1 day**   - hemorrhagic PVD OS  - today symptoms stably improved -- decreased floaters, no photopsias  - BCVA 20/20 OS -- stable  - Discussed findings and prognosis  - No RT or RD on 360 scleral depressed exam  - Reviewed s/s of RT/RD   - Strict return precautions for any such RT/RD signs/symptoms  6. Pseudophakia OU  - cataract OD progressively worsened following PPV  - s/p CE/IOL OD by expert surgeon Dr. Herbert Deaner on 09.24.19             - s/p CE/IOL OS by expert surgeon Dr. Herbert Deaner [phone]  - beautiful surgeries  - BCVA 20/25 OD, 20/20 OS             - s/p yag cap OD (04.14.21)--excellent opening  Ophthalmic Meds Ordered this visit:  No orders of the defined types were placed in this encounter.    Return in about 6 months (around 08/06/2022) for ERM OU, DFE, OCT.  There are no Patient Instructions on file for this visit.  Explained the diagnoses, plan, and follow up with the patient and they expressed understanding.  Patient expressed understanding of the importance of proper follow up care.   This document serves as a record of services personally performed by Gardiner Sleeper, MD, PhD. It was created on their behalf by Orvan Falconer, an ophthalmic technician. The creation of this record is the provider's dictation and/or activities during the visit.    Electronically signed by: Orvan Falconer, OA, 02/04/22  7:52 PM   Gardiner Sleeper, M.D., Ph.D. Diseases & Surgery of the Retina and Vitreous [facility]  I have reviewed the above documentation for accuracy and completeness, and I agree with the above. Gardiner Sleeper, M.D., Ph.D. 02/04/22 7:57 PM   Abbreviations: M myopia (nearsighted); A astigmatism;  H hyperopia (farsighted); P presbyopia; Mrx spectacle prescription;  CTL contact lenses; OD right eye; OS left eye; OU both eyes  XT exotropia; ET esotropia; PEK punctate epithelial keratitis; PEE punctate epithelial erosions; DES dry eye syndrome; MGD meibomian gland dysfunction; ATs artificial tears; PFAT's preservative free artificial tears; [place] nuclear sclerotic cataract; PSC posterior subcapsular cataract; ERM epi-retinal membrane; PVD posterior vitreous detachment; RD retinal detachment; DM diabetes mellitus; DR diabetic retinopathy; NPDR non-proliferative diabetic retinopathy; PDR proliferative diabetic retinopathy; CSME clinically significant macular edema;  DME diabetic macular edema; dbh dot blot hemorrhages; CWS cotton wool spot; POAG primary open angle glaucoma; C/D cup-to-disc ratio; HVF humphrey visual field; GVF goldmann visual field; OCT optical coherence tomography; IOP intraocular pressure; BRVO Branch retinal vein occlusion; CRVO central retinal vein occlusion; CRAO central retinal artery occlusion; BRAO branch retinal artery occlusion; RT retinal tear; SB scleral buckle; PPV pars plana vitrectomy; VH Vitreous hemorrhage; PRP panretinal laser photocoagulation; IVK intravitreal kenalog; VMT vitreomacular traction; MH Macular hole;  NVD neovascularization of the disc; NVE neovascularization elsewhere; AREDS age related eye disease study; ARMD age related macular degeneration; POAG primary open angle glaucoma; EBMD epithelial/anterior basement membrane dystrophy; ACIOL anterior chamber intraocular lens; IOL intraocular lens; PCIOL posterior chamber intraocular lens; Phaco/IOL phacoemulsification with intraocular lens placement; [place] photorefractive keratectomy; LASIK laser assisted in situ keratomileusis; HTN hypertension; DM diabetes mellitus; COPD chronic obstructive pulmonary disease

## 2022-02-04 ENCOUNTER — Encounter (INDEPENDENT_AMBULATORY_CARE_PROVIDER_SITE_OTHER): Payer: Self-pay | Admitting: Ophthalmology

## 2022-02-04 ENCOUNTER — Ambulatory Visit (INDEPENDENT_AMBULATORY_CARE_PROVIDER_SITE_OTHER): Payer: Federal, State, Local not specified - PPO | Admitting: Ophthalmology

## 2022-02-04 DIAGNOSIS — H3321 Serous retinal detachment, right eye: Secondary | ICD-10-CM

## 2022-02-04 DIAGNOSIS — H43812 Vitreous degeneration, left eye: Secondary | ICD-10-CM

## 2022-02-04 DIAGNOSIS — H35413 Lattice degeneration of retina, bilateral: Secondary | ICD-10-CM | POA: Diagnosis not present

## 2022-02-04 DIAGNOSIS — Z961 Presence of intraocular lens: Secondary | ICD-10-CM

## 2022-02-04 DIAGNOSIS — H33332 Multiple defects of retina without detachment, left eye: Secondary | ICD-10-CM | POA: Diagnosis not present

## 2022-02-04 DIAGNOSIS — H35371 Puckering of macula, right eye: Secondary | ICD-10-CM | POA: Diagnosis not present

## 2022-02-18 ENCOUNTER — Telehealth: Payer: Self-pay | Admitting: Family Medicine

## 2022-02-18 NOTE — Telephone Encounter (Signed)
Pt is scheduled for Jan 9th, however she is asking if a referall for a dermatologist can be started prior for a female dermatologist, so that can be in the works prior to her appt.

## 2022-02-19 NOTE — Telephone Encounter (Signed)
Please advise 

## 2022-02-26 NOTE — Telephone Encounter (Signed)
What is the reasoning she wants to be seen by dermatology? If she has a lesion she is concerned about, we would need to evaluate her prior to referral. If she would like a routine skin check, then referral can be placed today for her.

## 2022-02-26 NOTE — Telephone Encounter (Signed)
LVM for pt to CB regarding referral request.   Note:  Please ask for referral reason

## 2022-02-27 NOTE — Telephone Encounter (Signed)
Due to being an acute problem pt will need to see provider for proper dx. Pt can have done during next CPE if desired. LVM for pt to CB regarding referral.

## 2022-02-27 NOTE — Telephone Encounter (Signed)
Pt returned the call ands the reason for the referral is spots on her Torso and back, She also mentions she has had a bump on her head for years.

## 2022-03-06 ENCOUNTER — Ambulatory Visit: Payer: Federal, State, Local not specified - PPO | Admitting: Family Medicine

## 2022-03-06 ENCOUNTER — Encounter: Payer: Self-pay | Admitting: Family Medicine

## 2022-03-06 VITALS — BP 126/83 | HR 76 | Temp 97.8°F | Wt 168.4 lb

## 2022-03-06 DIAGNOSIS — R519 Headache, unspecified: Secondary | ICD-10-CM

## 2022-03-06 DIAGNOSIS — L729 Follicular cyst of the skin and subcutaneous tissue, unspecified: Secondary | ICD-10-CM

## 2022-03-06 DIAGNOSIS — L989 Disorder of the skin and subcutaneous tissue, unspecified: Secondary | ICD-10-CM | POA: Diagnosis not present

## 2022-03-06 NOTE — Progress Notes (Unsigned)
Gail Duncan , [birth year], 57 y.o., female MRN: [id number] Patient Care Team    Relationship Specialty Notifications Start End  Natalia Leatherwood, DO PCP - General Family Medicine  09/07/16   Zelphia Cairo, MD Consulting Physician Obstetrics and Gynecology  05/13/17   Ernest Mallick, MD Consulting Physician Orthopedic Surgery  04/12/19     Chief Complaint  Patient presents with   Rash    Spots on right side and stomach for about a year. Bumps in scalp.     Subjective: Pt presents for an OV with complaints of skin lesions on her stomach and right flank.  She also has 3 cystlike structures on her scalp that she would like to have removed.  She states her sister had a similar scalp cyst and had it removed.  She denies any drainage.     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 Bachelor's degree    Moved to [place], from [place] in July 2016.    EtOH use occasional,, nonsmoker, denies recreational drugs.   Drinks caffeinated beverages, uses herbal remedies, takes daily vitamins   Patient wears her seatbelt, wears a bike,, exercises at least 3 times a week. There is a smoke alarm in her home.   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   GERD (gastroesophageal reflux disease)    Hearing loss in right ear    Unknown cause, had full work up with Specialist in [place]   History of chicken pox    as child per pt   Pneumonia    20  yrs ago per pt on 08-09-2020   Retinal detachment    right eye 07-2017 per pt   Wears glasses    Past Surgical History:  Procedure Laterality Date   ABDOMINAL  EXPLORATION SURGERY     20 yrs ago per pt on 08-09-2020   ABLATION     uterine, 1=9 to 10 yrs ago per pt on 08-09-2020   ANAL FISTULOTOMY N/A 08/15/2020   Procedure: FISTULOTOMY;  Surgeon: Romie Levee, MD;  Location: [facility];  Service: General;  Laterality: N/A;   CATARACT EXTRACTION Right 11/23/2017   Dr. Elmer Picker   CATARACT EXTRACTION Left 01/2020   Dr. Elmer Picker   DENTAL SURGERY     root canal 2020, tooth extraction 17 yrs ago as of 08-09-2020   EYE SURGERY Right 11/23/2017   Cat Sx - Dr. Elmer Picker   EYE SURGERY Left 01/2020   Cat Sx - Dr. Elmer Picker   EYE SURGERY Right 08/09/2017   SBP+PPV - Dr. Rennis Chris   GAS INSERTION Right 08/09/2017   Procedure: INSERTION OF GAS;  Surgeon: Rennis Chris, MD;  [facility];  Service: Ophthalmology;  Laterality: Right;   LASER PHOTO ABLATION Left 08/09/2017   Procedure: LASER PHOTO ABLATION;  Surgeon: Rennis Chris, MD;  Location: [facility];  Service: Ophthalmology;  Laterality: Left;   miscarriage     210 yrs ago per  on on 08-09-2020   RECTAL EXAM UNDER ANESTHESIA N/A 08/15/2020   Procedure: EXAM UNDER ANESTHESIA;  Surgeon: Leighton Ruff, MD;  Location: [facility];  Service: General;  Laterality: N/A;   RETINAL DETACHMENT SURGERY Right 08/09/2017   SBP+PPV - Dr. Bernarda Caffey   SCLERAL BUCKLE WITH POSSIBLE 25 GAUGE PARS PLANA VITRECTOMY Right 08/09/2017   Procedure: SCLERAL BUCKLE WITH 25 GAUGE PARS PLANA VITRECTOMY AND ENDOLASER;  Surgeon: Bernarda Caffey, MD;  Location: [place];  Service: Ophthalmology;  Laterality: Right;   YAG LASER APPLICATION Right 95/18/8416   Family History  Problem Relation Age of Onset   Diabetes Mother    Prostate cancer Father    Hypertension Father    Retinal detachment Father    Lung cancer Paternal Grandfather    Amblyopia Neg Hx    Blindness Neg Hx    Cataracts Neg Hx    Glaucoma Neg Hx    Macular degeneration Neg Hx    Strabismus Neg Hx    Retinitis pigmentosa Neg Hx     Allergies as of 03/06/2022       Reactions   Asa [aspirin] Other (See Comments)   Contraindicated due to hearing loss        Medication List        Accurate as of March 06, 2022 11:59 PM. If you have any questions, ask your nurse or doctor.          STOP taking these medications    CoQ10-Vitamin E 100-10 MG-UNIT Caps Stopped by: Howard Pouch, DO       TAKE these medications    Biotin 1000 MCG Chew biotin 1,000 mcg chewable tablet  Take by oral route.   CITROMA PO 3 x week   MEPHYTON PO vitamin K   OVER THE COUNTER MEDICATION Neoceol  collagen liquid .05 fluid ounce per 1 tablespoon takes 5-6 x week   pantoprazole 40 MG tablet Commonly known as: PROTONIX Take 1 tablet by mouth 2 (two) times daily.   Vitamin D3 125 MCG (5000 UT) Caps Take 1 capsule (5,000 Units total) by mouth 3 (three) times a week.        All past medical history, surgical history, allergies, family history, immunizations andmedications were updated in the EMR today and reviewed under the history and medication portions of their EMR.     ROS Negative, with the exception of above mentioned in HPI   Objective:  BP 126/83   Pulse 76   Temp 97.8 F (36.6 C)   Wt 168 lb 6.4 oz (76.4 kg)   LMP 12/22/2014   SpO2 97%   BMI 29.83 kg/m  Body mass index is 29.83 kg/m. Physical Exam Vitals and nursing note reviewed.  Constitutional:      General: She is not in acute distress.    Appearance: Normal appearance. She is normal weight. She is not ill-appearing or toxic-appearing.  Eyes:     Extraocular Movements: Extraocular movements intact.     Conjunctiva/sclera: Conjunctivae normal.     Pupils: Pupils are equal, round, and reactive to light.  Skin:    Comments: X 4 skin lesions of the trunk area with mild hyperpigmentation. Scalp x3 cystic structures ranging from 1-2 inches.  No erythema.  No fluctuance.  None mobile.  Neurological:     Mental Status: She is alert and oriented  to person, place, and time. Mental status is at baseline.  Psychiatric:        Mood and Affect:  Mood normal.        Behavior: Behavior normal.        Thought Content: Thought content normal.        Judgment: Judgment normal.      No results found. No results found. No results found for this or any previous visit (from the past 24 hour(s)).  Assessment/Plan: Temisha Murley is a 58 y.o. female present for OV for  Scalp cyst/pain X 3 what appears to be consistent with epidermal cysts.  Some of them are larger in nature and uncomfortable for her, especially when laying down. - Ambulatory referral to Dermatology  Skin lesions Skin lesions on trunk seem to be a mixture of SKs, possible AK's and nevi.  Patient would like to have dermatology referral for skin check routinely. - Ambulatory referral to Dermatology   Reviewed expectations re: course of current medical issues. Discussed self-management of symptoms. Outlined signs and symptoms indicating need for more acute intervention. Patient verbalized understanding and all questions were answered. Patient received an After-Visit Summary.    Orders Placed This Encounter  Procedures   Ambulatory referral to Dermatology   No orders of the defined types were placed in this encounter.  Referral Orders         Ambulatory referral to Dermatology       Note is dictated utilizing voice recognition software. Although note has been proof read prior to signing, occasional typographical errors still can be missed. If any questions arise, please do not hesitate to call for verification.   electronically signed by:  Howard Pouch, DO  [place]

## 2022-03-06 NOTE — Patient Instructions (Signed)
No follow-ups on file.        Great to see you today.  Referral to dermatology has been placed for you.

## 2022-03-10 ENCOUNTER — Encounter: Payer: Federal, State, Local not specified - PPO | Admitting: Family Medicine

## 2022-05-13 ENCOUNTER — Encounter: Payer: Self-pay | Admitting: Family Medicine

## 2022-05-13 ENCOUNTER — Ambulatory Visit (INDEPENDENT_AMBULATORY_CARE_PROVIDER_SITE_OTHER): Payer: Federal, State, Local not specified - PPO | Admitting: Family Medicine

## 2022-05-13 VITALS — BP 128/84 | HR 81 | Temp 98.7°F | Ht 61.61 in | Wt 169.4 lb

## 2022-05-13 DIAGNOSIS — Z1231 Encounter for screening mammogram for malignant neoplasm of breast: Secondary | ICD-10-CM | POA: Diagnosis not present

## 2022-05-13 DIAGNOSIS — Z0000 Encounter for general adult medical examination without abnormal findings: Secondary | ICD-10-CM | POA: Diagnosis not present

## 2022-05-13 DIAGNOSIS — Z130 Encounter for screening for diseases of the blood and blood-forming organs and certain disorders involving the immune mechanism: Secondary | ICD-10-CM

## 2022-05-13 DIAGNOSIS — Z1329 Encounter for screening for other suspected endocrine disorder: Secondary | ICD-10-CM | POA: Diagnosis not present

## 2022-05-13 DIAGNOSIS — Z131 Encounter for screening for diabetes mellitus: Secondary | ICD-10-CM

## 2022-05-13 DIAGNOSIS — Z1159 Encounter for screening for other viral diseases: Secondary | ICD-10-CM

## 2022-05-13 DIAGNOSIS — Z13220 Encounter for screening for lipoid disorders: Secondary | ICD-10-CM

## 2022-05-13 LAB — LIPID PANEL
Cholesterol: 251 mg/dL — ABNORMAL HIGH (ref 0–200)
HDL: 100.7 mg/dL (ref >39.00)
LDL Cholesterol: 130 mg/dL — ABNORMAL HIGH (ref 0–99)
NonHDL: 150.53
Total CHOL/HDL Ratio: 2
Triglycerides: 103 mg/dL (ref 0.0–149.0)
VLDL: 20.6 mg/dL (ref 0.0–40.0)

## 2022-05-13 LAB — CBC
HCT: 38.6 % (ref 36.0–46.0)
Hemoglobin: 12.8 g/dL (ref 12.0–15.0)
MCHC: 33.3 g/dL (ref 30.0–36.0)
MCV: 88.6 fl (ref 78.0–100.0)
Platelets: 235 10*3/uL (ref 150.0–400.0)
RBC: 4.35 Mil/uL (ref 3.87–5.11)
RDW: 13.7 % (ref 11.5–15.5)
WBC: 4.5 10*3/uL (ref 4.0–10.5)

## 2022-05-13 LAB — COMPREHENSIVE METABOLIC PANEL
ALT: 13 U/L (ref 0–35)
AST: 16 U/L (ref 0–37)
Albumin: 4.3 g/dL (ref 3.5–5.2)
Alkaline Phosphatase: 75 U/L (ref 39–117)
BUN: 12 mg/dL (ref 6–23)
CO2: 29 mEq/L (ref 19–32)
Calcium: 9.5 mg/dL (ref 8.4–10.5)
Chloride: 103 mEq/L (ref 96–112)
Creatinine, Ser: 0.7 mg/dL (ref 0.40–1.20)
GFR: 96.99 mL/min (ref >60.00)
Glucose, Bld: 77 mg/dL (ref 70–99)
Potassium: 4 mEq/L (ref 3.5–5.1)
Sodium: 139 mEq/L (ref 135–145)
Total Bilirubin: 0.7 mg/dL (ref 0.2–1.2)
Total Protein: 6.9 g/dL (ref 6.0–8.3)

## 2022-05-13 LAB — TSH: TSH: 0.93 u[IU]/mL (ref 0.35–5.50)

## 2022-05-13 LAB — HEMOGLOBIN A1C: Hgb A1c MFr Bld: 4.9 % (ref 4.6–6.5)

## 2022-05-13 NOTE — Progress Notes (Signed)
Patient ID: Gail Duncan, female  DOB: March 27, 1965, 57 y.o.   MRN: [id number] Patient Care Team    Relationship Specialty Notifications Start End  Ma Hillock, DO PCP - General Family Medicine  09/07/16   Marylynn Pearson, MD Consulting Physician Obstetrics and Gynecology  05/13/17   Verner Mould, MD Consulting Physician Orthopedic Surgery  04/12/19     Chief Complaint  Patient presents with   Annual Exam    Pt is not fasting    Subjective:  Gail Duncan is a 57 y.o.  Female  present for CPE. All past medical history, surgical history, allergies, family history, immunizations, medications and social history were updated in the electronic medical record today. All recent labs, ED visits and hospitalizations within the last year were reviewed.  Health maintenance:  Colonoscopy: 06/2017-  GI at [facility]. 5 yr follow up per pt Mammogram: 10/23/2020 > ordered [facility] Cervical cancer screening:  UTD 10/2019(Dr. Julien Girt) Immunizations: Tdap UTD 04/2017, Declined flu shot. Shingrix declined Infectious disease screening: HIV completed with pregnancy. Hep c agree Assistive device: none Oxygen [id number] Patient has a Dental home. 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   GERD (gastroesophageal reflux disease)    Hearing loss in right ear    Unknown cause, had full work up with Specialist in [place]   History of chicken pox    as child per pt   Pneumonia    20  yrs ago per pt on 08-09-2020   Retinal detachment    right eye 07-2017  per pt   Wears glasses    Allergies  Allergen Reactions   Asa [Aspirin] Other (See Comments)    Contraindicated due to hearing loss   Past Surgical History:  Procedure Laterality Date   ABDOMINAL EXPLORATION SURGERY     20 yrs ago per pt on 08-09-2020   ABLATION     uterine, 1=9 to 10 yrs ago per pt on 08-09-2020   ANAL FISTULOTOMY N/A 08/15/2020   Procedure: FISTULOTOMY;  Surgeon: Leighton Ruff, MD;  Location: [facility];  Service: General;  Laterality: N/A;   CATARACT EXTRACTION Right 11/23/2017   Dr. Herbert Deaner   CATARACT EXTRACTION Left 01/2020   Dr. Herbert Deaner   DENTAL SURGERY     root canal 2020, tooth extraction 17 yrs ago as of 08-09-2020   EYE SURGERY Right 11/23/2017   Cat Sx - Dr. Herbert Deaner   EYE SURGERY Left 01/2020   Cat Sx - Dr. Herbert Deaner   EYE SURGERY Right 08/09/2017   SBP+PPV - Dr. Bernarda Caffey   GAS INSERTION Right 08/09/2017   Procedure: INSERTION OF GAS;  Surgeon: Bernarda Caffey, MD;  Location: [facility];  Service: Ophthalmology;  Laterality: Right;   LASER PHOTO ABLATION Left 08/09/2017   Procedure: LASER PHOTO ABLATION;  Surgeon: Bernarda Caffey, MD;  Location: [place];  Service: Ophthalmology;  Laterality: Left;   miscarriage     210 yrs ago per on on 08-09-2020   RECTAL EXAM UNDER ANESTHESIA N/A 08/15/2020   Procedure: EXAM UNDER ANESTHESIA;  Surgeon: Leighton Ruff, MD;  Location: [facility];  Service: General;  Laterality: N/A;   RETINAL DETACHMENT SURGERY Right 08/09/2017   SBP+PPV - Dr. Bernarda Caffey   SCLERAL BUCKLE WITH POSSIBLE 25 GAUGE PARS PLANA VITRECTOMY Right 08/09/2017   Procedure: SCLERAL BUCKLE WITH 25 GAUGE PARS PLANA VITRECTOMY AND ENDOLASER;  Surgeon: Bernarda Caffey, MD;  Location: [facility];  Service: Ophthalmology;  Laterality: Right;   YAG LASER APPLICATION Right [id number]   Family History  Problem Relation Age of Onset   Diabetes Mother    Prostate cancer Father    Hypertension Father    Retinal detachment Father    Lung  cancer Paternal Grandfather    Amblyopia Neg Hx    Blindness Neg Hx    Cataracts Neg Hx    Glaucoma Neg Hx    Macular degeneration Neg Hx    Strabismus Neg Hx    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 Bachelor's degree    Moved to [place], from [place] in July 2016.    EtOH use occasional,, nonsmoker, denies recreational drugs.   Drinks caffeinated beverages, uses herbal remedies, takes daily vitamins   Patient wears her seatbelt, wears a bike,, exercises at least 3 times a week. There is a smoke alarm in her home.    Allergies as of 05/13/2022       Reactions   Asa [aspirin] Other (See Comments)   Contraindicated due to hearing loss        Medication List        Accurate as of May 13, 2022  9:07 AM. If you have any questions, ask your nurse or doctor.          BIOTIN PO 8 mg daily.   CITROMA PO 3 x week   l-methylfolate-B6-B12 3-35-2 MG Tabs tablet Commonly known as: METANX Take 1 tablet by mouth once a week. 2 times a week   MEPHYTON PO vitamin K   OVER THE COUNTER MEDICATION Neoceol  collagen liquid .05 fluid ounce per 1 tablespoon takes 5-6 x week   pantoprazole 20 MG tablet Commonly known as: PROTONIX Take 1 tablet by mouth once a week. 1-2 times a week   Vitamin D3 125 MCG (5000 UT) capsule Generic drug: Cholecalciferol Take 1 capsule (5,000 Units total) by mouth 3 (three) times a week.        All past medical history, surgical history, allergies, family history, immunizations andmedications were updated in the EMR today and reviewed under the history and medication portions of their EMR.     No results found for this or any previous visit (from the past 2160 hour(s)).   ROS: 14 pt review of systems performed and negative (unless mentioned in an HPI)  Objective: BP 128/84   Pulse 81   Temp 98.7 F (37.1 C)   Ht 5' 1.61" (1.565 m)   Wt 169 lb 6.4 oz  (76.8 kg)   LMP 12/22/2014   SpO2 98%   BMI 31.37 kg/m  Physical Exam Vitals and nursing note reviewed.  Constitutional:      General: She is not in acute distress.    Appearance: Normal appearance. She is not ill-appearing or toxic-appearing.  HENT:  Head: Normocephalic and atraumatic.     Right Ear: Tympanic membrane, ear canal and external ear normal. There is no impacted cerumen.     Left Ear: Tympanic membrane, ear canal and external ear normal. There is no impacted cerumen.     Nose: No congestion or rhinorrhea.     Mouth/Throat:     Mouth: Mucous membranes are moist.     Pharynx: Oropharynx is clear. No oropharyngeal exudate or posterior oropharyngeal erythema.  Eyes:     General: No scleral icterus.       Right eye: No discharge.        Left eye: No discharge.     Extraocular Movements: Extraocular movements intact.     Conjunctiva/sclera: Conjunctivae normal.     Pupils: Pupils are equal, round, and reactive to light.  Cardiovascular:     Rate and Rhythm: Normal rate and regular rhythm.     Pulses: Normal pulses.     Heart sounds: Normal heart sounds. No murmur heard.    No friction rub. No gallop.  Pulmonary:     Effort: Pulmonary effort is normal. No respiratory distress.     Breath sounds: Normal breath sounds. No stridor. No wheezing, rhonchi or rales.  Chest:     Chest wall: No tenderness.  Abdominal:     General: Abdomen is flat. Bowel sounds are normal. There is no distension.     Palpations: Abdomen is soft. There is no mass.     Tenderness: There is no abdominal tenderness. There is no right CVA tenderness, left CVA tenderness, guarding or rebound.     Hernia: No hernia is present.  Musculoskeletal:        General: No swelling, tenderness or deformity. Normal range of motion.     Cervical back: Normal range of motion and neck supple. No rigidity or tenderness.     Right lower leg: No edema.     Left lower leg: No edema.  Lymphadenopathy:      Cervical: No cervical adenopathy.  Skin:    General: Skin is warm and dry.     Coloration: Skin is not jaundiced or pale.     Findings: No bruising, erythema, lesion or rash.  Neurological:     General: No focal deficit present.     Mental Status: She is alert and oriented to person, place, and time. Mental status is at baseline.     Cranial Nerves: No cranial nerve deficit.     Sensory: No sensory deficit.     Motor: No weakness.     Coordination: Coordination normal.     Gait: Gait normal.     Deep Tendon Reflexes: Reflexes normal.  Psychiatric:        Mood and Affect: Mood normal.        Behavior: Behavior normal.        Thought Content: Thought content normal.        Judgment: Judgment normal.    No results found.  Assessment/plan: Gail Duncan is a 57 y.o. female present for CPE Breast cancer screening by mammogram - MM 3D SCREENING MAMMOGRAM BILATERAL BREAST; Future Encounter for hepatitis C screening test for low risk patient - Hepatitis C Antibody Lipid screening - Lipid panel Diabetes mellitus screening - Comprehensive metabolic panel - Hemoglobin A1c Screening for deficiency anemia - CBC Screening for endocrine disorder - TSH  Encounter for preventive health examination Patient was encouraged to exercise greater than 150 minutes a week. Patient was encouraged to choose  a diet filled with fresh fruits and vegetables, and lean meats. AVS provided to patient today for education/recommendation on gender specific health and safety maintenance. Colonoscopy: 06/2017-  GI at [facility] 10 yr follow up Mammogram: 10/23/2020 > ordered [facility] Cervical cancer screening:  UTD 10/2019(Dr. Julien Girt) Immunizations: Tdap UTD 04/2017, Declined flu shot. Shingrix declined Infectious disease screening: HIV completed with pregnancy. Hep c completed today - CBC - Comprehensive metabolic panel - Hemoglobin A1c - Lipid panel - TSH   If you are interested in weight loss counseling please  make appt to discuss and bring with you 2 weeks of a food diary/log on notebook paper.  Food log is mandatory on first appt to proceed with counseling.  Weight loss counseling encompasses diet, exercise and can include  medications when appropriate and affordable.  There are routine appts for check-ins and weights to track progress and keep you on track.  Routine check-ins (in person) are also mandatory to continue with prescription refills. Check-in timeline  can range from 4 weeks to 12 weeks, depending on physician's recommendations and which step you are in of your weight loss journey.  Your BMI today is Body mass index is 31.37 kg/m.  Please check with your insurance prior to appt and ask them if they cover weight loss medications for your BMI? And if so, which medications. They may tell you some of the diabetes meds that are used for weight loss also,  are on your formulary- but this does not mean they are covered for weight loss only.  Even if they tell with a prior auth it is covered- make sure they check to see if you personally meet criteria with your BMI.  Return in about 1 year (around 05/15/2023) for cpe (20 min).  Electronically signed by: Howard Pouch, DO [place]

## 2022-05-13 NOTE — Patient Instructions (Addendum)
Return in about 1 year (around 05/15/2023) for cpe (20 min).        Great to see you today.  I have refilled the medication(s) we provide.   If labs were collected, we will inform you of lab results once received either by echart message or telephone call.   - echart message- for normal results that have been seen by the patient already.   - telephone call: abnormal results or if patient has not viewed results in their echart.    If you are age 56 or younger, your body mass index should be between 19-25. Your Body mass index is Body mass index is 31.37 kg/m. Marland Kitchen If this is above the aformentioned range listed, you are consider overwieght and obese if BMI > 30, by medical definition and standards. Routine daily exercise and dietary modifications are encouraged. If you would like additional guidance on weight loss, please make an appointment for weight loss counseling - must be an appointment dedicate to weight loss counseling alone. I would be happy to help you.   If you are interested in weight loss counseling please make appt to discuss and bring with you 2 weeks of a food diary/log on notebook paper.  Food log is mandatory on first appt to proceed with counseling.  Weight loss counseling encompasses diet, exercise and can include  medications when appropriate and affordable.  There are routine appts for check-ins and weights to track progress and keep you on track.  Routine check-ins (in person) are also mandatory to continue with prescription refills. Check-in timeline  can range from 4 weeks to 12 weeks, depending on physician's recommendations and which step you are in of your weight loss journey.  Your BMI today is Body mass index is 31.37 kg/m.  Please check with your insurance prior to appt and ask them if they cover weight loss medications for your BMI? And if so, which medications. They may tell you some of the diabetes meds that are used for weight loss also,  are on your  formulary- but this does not mean they are covered for weight loss only.  Even if they tell with a prior auth it is covered- make sure they check to see if you personally meet criteria with your BMI.

## 2022-05-14 ENCOUNTER — Telehealth: Payer: Self-pay

## 2022-05-14 LAB — HEPATITIS C ANTIBODY: Hepatitis C Ab: NONREACTIVE

## 2022-05-14 NOTE — Telephone Encounter (Signed)
noted 

## 2022-05-14 NOTE — Telephone Encounter (Signed)
Patient returned call about results. She is aware of results (mychart), no recommendations per Dr. Raoul Pitch.  Patient has no questions/concerns at this time.

## 2022-06-25 ENCOUNTER — Ambulatory Visit
Admission: RE | Admit: 2022-06-25 | Discharge: 2022-06-25 | Disposition: A | Discharge status: Home / Self Care | Payer: Federal, State, Local not specified - PPO | Source: Ambulatory Visit | Attending: Family Medicine | Admitting: Family Medicine

## 2022-06-25 DIAGNOSIS — Z1231 Encounter for screening mammogram for malignant neoplasm of breast: Secondary | ICD-10-CM | POA: Diagnosis not present

## 2022-07-08 DIAGNOSIS — Z124 Encounter for screening for malignant neoplasm of cervix: Secondary | ICD-10-CM | POA: Diagnosis not present

## 2022-07-08 DIAGNOSIS — Z01419 Encounter for gynecological examination (general) (routine) without abnormal findings: Secondary | ICD-10-CM | POA: Diagnosis not present

## 2022-07-08 DIAGNOSIS — Z1151 Encounter for screening for human papillomavirus (HPV): Secondary | ICD-10-CM | POA: Diagnosis not present

## 2022-07-20 DIAGNOSIS — H35371 Puckering of macula, right eye: Secondary | ICD-10-CM | POA: Diagnosis not present

## 2022-08-10 DIAGNOSIS — Z961 Presence of intraocular lens: Secondary | ICD-10-CM | POA: Diagnosis not present

## 2022-08-10 DIAGNOSIS — H26492 Other secondary cataract, left eye: Secondary | ICD-10-CM | POA: Diagnosis not present

## 2022-08-10 DIAGNOSIS — H35371 Puckering of macula, right eye: Secondary | ICD-10-CM | POA: Diagnosis not present

## 2022-08-19 ENCOUNTER — Encounter (INDEPENDENT_AMBULATORY_CARE_PROVIDER_SITE_OTHER): Payer: Federal, State, Local not specified - PPO | Admitting: Ophthalmology

## 2022-08-19 DIAGNOSIS — Z961 Presence of intraocular lens: Secondary | ICD-10-CM

## 2022-08-19 DIAGNOSIS — H43812 Vitreous degeneration, left eye: Secondary | ICD-10-CM

## 2022-08-19 DIAGNOSIS — H35413 Lattice degeneration of retina, bilateral: Secondary | ICD-10-CM

## 2022-08-19 DIAGNOSIS — H33332 Multiple defects of retina without detachment, left eye: Secondary | ICD-10-CM

## 2022-08-19 DIAGNOSIS — H35371 Puckering of macula, right eye: Secondary | ICD-10-CM

## 2022-08-19 DIAGNOSIS — H3321 Serous retinal detachment, right eye: Secondary | ICD-10-CM

## 2022-10-06 DIAGNOSIS — D2261 Melanocytic nevi of right upper limb, including shoulder: Secondary | ICD-10-CM | POA: Diagnosis not present

## 2022-10-06 DIAGNOSIS — L7211 Pilar cyst: Secondary | ICD-10-CM | POA: Diagnosis not present

## 2022-10-06 DIAGNOSIS — B353 Tinea pedis: Secondary | ICD-10-CM | POA: Diagnosis not present

## 2022-10-06 DIAGNOSIS — D2262 Melanocytic nevi of left upper limb, including shoulder: Secondary | ICD-10-CM | POA: Diagnosis not present

## 2022-10-20 DIAGNOSIS — L7211 Pilar cyst: Secondary | ICD-10-CM | POA: Diagnosis not present

## 2022-10-21 ENCOUNTER — Encounter (INDEPENDENT_AMBULATORY_CARE_PROVIDER_SITE_OTHER): Payer: Federal, State, Local not specified - PPO | Admitting: Ophthalmology

## 2022-10-29 NOTE — Progress Notes (Signed)
[facility] - Clinic Note  11/04/2022     CHIEF COMPLAINT Patient presents for Retina Follow Up   HISTORY OF PRESENT ILLNESS: Gail Duncan is a 57 y.o. female who presents to the clinic today for:  HPI     Retina Follow Up   In both eyes.  This started 6 months ago.  Duration of 6 months.  Since onset it is stable.  I, the attending physician,  performed the HPI with the patient and updated documentation appropriately.        Comments   6 month retina follow up ERM OU pt is reporting no vision changes noticed she denies any flashes has a floaters in her left eye she had a yag OS       Last edited by Rennis Chris, MD on 11/08/2022  2:17 AM.    Pt states she had Yag OS, she states the distortion in the right eye is better than it was last time she was here   Referring physician: Natalia Leatherwood, DO [address] [place],  [place] [address]  HISTORICAL INFORMATION:   Selected notes from the MEDICAL RECORD NUMBER Referred by ED for possible RD   CURRENT MEDICATIONS: No current outpatient medications on file. (Ophthalmic Drugs)   No current facility-administered medications for this visit. (Ophthalmic Drugs)   Current Outpatient Medications (Other)  Medication Sig   BIOTIN PO 8 mg daily.   Cholecalciferol (VITAMIN D3) 125 MCG (5000 UT) CAPS Take 1 capsule (5,000 Units total) by mouth 3 (three) times a week.   l-methylfolate-B6-B12 (METANX) 3-35-2 MG TABS tablet Take 1 tablet by mouth once a week. 2 times a week   Magnesium Citrate (CITROMA PO) 3 x week   OVER THE COUNTER MEDICATION Neoceol  collagen liquid .05 fluid ounce per 1 tablespoon takes 5-6 x week   pantoprazole (PROTONIX) 20 MG tablet Take 1 tablet by mouth once a week. 1-2 times a week   Phytonadione (MEPHYTON PO) vitamin K   No current facility-administered medications for this visit. (Other)   REVIEW OF SYSTEMS: ROS   Positive for: Eyes Negative for: Constitutional, Gastrointestinal,  Neurological, Skin, Genitourinary, Musculoskeletal, HENT, Endocrine, Cardiovascular, Respiratory, Psychiatric, Allergic/Imm, Heme/Lymph Last edited by Etheleen Mayhew, COT on 11/04/2022  3:04 PM.       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   GERD (gastroesophageal reflux disease)    Hearing loss in right ear    Unknown cause, had full work up with Specialist in [place]   History of chicken pox    as child per pt   Pneumonia    20  yrs ago per pt on 08-09-2020   Retinal detachment    right eye 07-2017 per pt   Wears glasses    Past Surgical History:  Procedure Laterality Date   ABDOMINAL EXPLORATION SURGERY     20 yrs ago per pt on 08-09-2020   ABLATION     uterine, 1=9 to 10 yrs ago per pt on 08-09-2020   ANAL FISTULOTOMY N/A 08/15/2020   Procedure: FISTULOTOMY;  Surgeon: Romie Levee, MD;  Location: [facility];  Service: General;  Laterality: N/A;   CATARACT EXTRACTION Right 11/23/2017   Dr. Elmer Picker   CATARACT EXTRACTION Left 01/2020   Dr. Elmer Picker   DENTAL SURGERY  root canal 2020, tooth extraction 17 yrs ago as of 08-09-2020   EYE SURGERY Right 11/23/2017   Cat Sx - Dr. Elmer Picker   EYE SURGERY Left 01/2020   Cat Sx - Dr. Elmer Picker   EYE SURGERY Right 08/09/2017   SBP+PPV - Dr. Rennis Chris   GAS INSERTION Right 08/09/2017   Procedure: INSERTION OF GAS;  Surgeon: Rennis Chris, MD;  Location: [facility] OR;  Service: Ophthalmology;  Laterality: Right;   LASER PHOTO ABLATION Left 08/09/2017   Procedure: LASER PHOTO ABLATION;  Surgeon: Rennis Chris, MD;  Location: [facility] OR;  Service: Ophthalmology;  Laterality: Left;   miscarriage     210 yrs ago per on on 08-09-2020   RECTAL EXAM UNDER ANESTHESIA N/A 08/15/2020   Procedure: EXAM UNDER ANESTHESIA;  Surgeon: Romie Levee, MD;  Location: [facility];  Service: General;  Laterality: N/A;   RETINAL DETACHMENT SURGERY Right 08/09/2017   SBP+PPV - Dr. Rennis Chris   SCLERAL BUCKLE WITH POSSIBLE 25 GAUGE PARS PLANA VITRECTOMY Right 08/09/2017   Procedure: SCLERAL BUCKLE WITH 25 GAUGE PARS PLANA VITRECTOMY AND ENDOLASER;  Surgeon: Rennis Chris, MD;  Location: [facility] OR;  Service: Ophthalmology;  Laterality: Right;   YAG LASER APPLICATION Right 06/14/2019   FAMILY HISTORY Family History  Problem Relation Age of Onset   Diabetes Mother    Prostate cancer Father    Hypertension Father    Retinal detachment Father    Lung cancer Paternal Grandfather    Amblyopia Neg Hx    Blindness Neg Hx    Cataracts Neg Hx    Glaucoma Neg Hx    Macular degeneration Neg Hx    Strabismus Neg Hx    Retinitis pigmentosa Neg Hx    SOCIAL HISTORY Social History   Tobacco Use   Smoking status: Never   Smokeless tobacco: Never  Vaping Use   Vaping status: Never Used  Substance Use Topics   Alcohol use: Yes    Comment: rare monthly   Drug use: No       OPHTHALMIC EXAM:  Base Eye Exam     Visual Acuity (Snellen - Linear)       Right Left   Dist cc 20/30 +2 20/20   Dist ph cc 20/20 -1          Tonometry (Tonopen, 3:09 PM)       Right Left   Pressure 15 16         Pupils       Pupils Dark Light Shape React APD   Right PERRL 3 2 Round Brisk None   Left PERRL 3 2 Round Brisk None         Visual Fields       Left Right    Full Full         Extraocular Movement       Right Left    Full, Ortho Full, Ortho         Neuro/Psych     Oriented x3: Yes   Mood/Affect: Normal         Dilation     Both eyes: 2.5% Phenylephrine @ 3:09 PM           Slit Lamp and Fundus Exam     Slit Lamp Exam       Right Left   Lids/Lashes Mild Dermatochalasis - upper lid, mild Meibomian gland dysfunction Mild Dermatochalasis - upper lid, mild Meibomian gland dysfunction   Conjunctiva/Sclera White and quiet  White and  quiet   Cornea Trace Punctate epithelial erosions, trace tear film debris, Well healed cataract wound at 1030 Trace Punctate epithelial erosions, Mild debris in tear film, Well healed temporal cataract wound   Anterior Chamber Deep and clear Deep and clear   Iris Round and well dilated Round and well dilated   Lens PC IOL in good position; open PC PC IOL in good position, open PCO   Anterior Vitreous Post vitrectomy clear Vitreous syneresis, Posterior vitreous detachment         Fundus Exam       Right Left   Disc Pink and Sharp, mildly Tilted disc, temporal PPA Pink and Sharp, mildly Tilted disc, mild temporal Peripapillary atrophy, Compact   C/D Ratio 0.3 0.4   Macula Flat, good foveal reflex, epiretinal membrane with striae, mild RPE mottling, mild central thickening, focal MA SN mac -- resolved, no heme Flat, blunted foveal reflex, mild Retinal pigment epithelial mottling, No heme or edema, trace ERM nasal macula   Vessels attenuated, mild tortuosity mild attenuation, mild tortuosity   Periphery Retina attached, good buckle height, good laser 360 on buckle and surrounding breaks, mild fibrosis over buckle at 1030 -- stable from prior Attached, pigmented Lattice degeneration with atrophic holes at 0430; lattice at 1200 with atrophic break; good laser in place surrounding all lesions, no new RT/RD or lattice           Refraction     Wearing Rx       Sphere Cylinder Axis Add   Right -0.50 Sphere  +1.50   Left -0.75 +0.75 146 +1.50    Type: SVL           IMAGING AND PROCEDURES  Imaging and Procedures for @TODAY @  OCT, Retina - OU - Both Eyes       Right Eye Quality was good. Central Foveal Thickness: 528. Progression has been stable. Findings include no IRF, no SRF, abnormal foveal contour, myopic contour, epiretinal membrane, macular pucker (Persistent ERM and pucker with mild interval increase in central thickness).   Left Eye Quality was good. Central Foveal  Thickness: 281. Progression has been stable. Findings include normal foveal contour, no IRF, no SRF (trace ERM nasal macula ).   Notes *Images captured and stored on drive  Diagnosis / Impression:  OD: Persistent ERM and pucker with mild interval increase in central thickness OS: trace ERM nasal macula   Clinical management:  See below  Abbreviations: NFP - Normal foveal profile. CME - cystoid macular edema. PED - pigment epithelial detachment. IRF - intraretinal fluid. SRF - subretinal fluid. EZ - ellipsoid zone. ERM - epiretinal membrane. ORA - outer retinal atrophy. ORT - outer retinal tubulation. SRHM - subretinal hyper-reflective material             ASSESSMENT/PLAN:    ICD-10-CM   1. RD (retinal detachment), right  H33.21     2. Epiretinal membrane (ERM) of right eye  H35.371 OCT, Retina - OU - Both Eyes    3. Lattice degeneration of both retinas  H35.413     4. 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 Posterior vitreous detachment of left eye  H43.812     6. Pseudophakia, both eyes  Z96.1       1. Rhegmatogenous retinal detachment, RIGHT eye  - bullous superotemporal mac on detachment, onset of foveal involvement Saturday, 01/30/17 by history  - detached from 1030-1200 oclock, large horseshoe tear  from 1030-1100 and linear tear at 1130 within bed of lattice  - s/p SBP + PPV/EL/FAX/14% C3F8 OD, 06.10.19             - retina attached and in good position -- good buckle height and laser around breaks  - BCVA 20/20             - IOP OK today at 15  2.  ERM OD  - ERM w/ pucker and central thickening OD  - OCT shows mild progression of central thickening  - asymptomatic, no metamorphopsia  - BCVA 20/20 OD  - no indication for surgery at this time, patient agrees.   - monitor for now   - f/u in 6 months, sooner prn, DFE, OCT  3,4. Lattice degeneration OU;  w/ atrophic breaks left eye  - OD - superotemporal lattice at site of breaks and  detachment  - OS - patches of lattice at 12 and 430 with a single atrophic breaks at each site  - s/p laser retinopexy OS at time of RD surgery (6.10.19)  - good laser in place   - no new RT/RD or lattice OU  - monitor  5. PVD OS  **pt presented acutely on 2.23.22 for persistent flashes of light and floaters OS x1 day**   - hemorrhagic PVD OS  - today symptoms stably improved -- decreased floaters, no photopsias  - BCVA 20/20 OS -- stable  - Discussed findings and prognosis  - No RT or RD on 360 scleral depressed exam  - Reviewed s/s of RT/RD   - Strict return precautions for any such RT/RD signs/symptoms  6. Pseudophakia OU  - cataract OD progressively worsened following PPV  - s/p CE/IOL OU by expert surgeon Dr. Elmer Picker (OD 09.24.19; OS 12.2021)             - s/p yag cap OD (04.14.21)  - s/p yag cap OS (2024)  - BCVA 20/20 OU - monitor  Ophthalmic Meds Ordered this visit:  No orders of the defined types were placed in this encounter.    Return in about 6 months (around 05/04/2023) for f/u ERM OD, DFE, OCT.  There are no Patient Instructions on file for this visit.  Explained the diagnoses, plan, and follow up with the patient and they expressed understanding.  Patient expressed understanding of the importance of proper follow up care.   This document serves as a record of services personally performed by Karie Chimera, MD, PhD. It was created on their behalf by De Blanch, an ophthalmic technician. The creation of this record is the provider's dictation and/or activities during the visit.    Electronically signed by: De Blanch, OA, 11/08/22  2:20 AM  This document serves as a record of services personally performed by Karie Chimera, MD, PhD. It was created on their behalf by Glee Arvin. Manson Passey, OA an ophthalmic technician. The creation of this record is the provider's dictation and/or activities during the visit.    Electronically signed by: Glee Arvin. Manson Passey, OA  11/08/22 2:20 AM  Karie Chimera, M.D., Ph.D. Diseases & Surgery of the Retina and Vitreous [facility]  I have reviewed the above documentation for accuracy and completeness, and I agree with the above. Karie Chimera, M.D., Ph.D. 11/08/22 2:22 AM   Abbreviations: M myopia (nearsighted); A astigmatism; H hyperopia (farsighted); P presbyopia; Mrx spectacle prescription;  CTL contact lenses; OD right eye; OS left eye; OU both  eyes  XT exotropia; ET esotropia; PEK punctate epithelial keratitis; PEE punctate epithelial erosions; DES dry eye syndrome; MGD meibomian gland dysfunction; ATs artificial tears; PFAT's preservative free artificial tears; NSC nuclear sclerotic cataract; PSC posterior subcapsular cataract; ERM epi-retinal membrane; PVD posterior vitreous detachment; RD retinal detachment; DM diabetes mellitus; DR diabetic retinopathy; NPDR non-proliferative diabetic retinopathy; PDR proliferative diabetic retinopathy; CSME clinically significant macular edema; DME diabetic macular edema; dbh dot blot hemorrhages; CWS cotton wool spot; POAG primary open angle glaucoma; C/D cup-to-disc ratio; HVF humphrey visual field; GVF goldmann visual field; OCT optical coherence tomography; IOP intraocular pressure; BRVO Branch retinal vein occlusion; CRVO central retinal vein occlusion; CRAO central retinal artery occlusion; BRAO branch retinal artery occlusion; RT retinal tear; SB scleral buckle; PPV pars plana vitrectomy; VH Vitreous hemorrhage; PRP panretinal laser photocoagulation; IVK intravitreal kenalog; VMT vitreomacular traction; MH Macular hole;  NVD neovascularization of the disc; NVE neovascularization elsewhere; AREDS age related eye disease study; ARMD age related macular degeneration; POAG primary open angle glaucoma; EBMD epithelial/anterior basement membrane dystrophy; ACIOL anterior chamber intraocular lens; IOL intraocular lens; PCIOL posterior chamber intraocular lens;  Phaco/IOL phacoemulsification with intraocular lens placement; PRK photorefractive keratectomy; LASIK laser assisted in situ keratomileusis; HTN hypertension; DM diabetes mellitus; COPD chronic obstructive pulmonary disease

## 2022-10-30 DIAGNOSIS — L7211 Pilar cyst: Secondary | ICD-10-CM | POA: Diagnosis not present

## 2022-11-04 ENCOUNTER — Encounter (INDEPENDENT_AMBULATORY_CARE_PROVIDER_SITE_OTHER): Payer: Self-pay | Admitting: Ophthalmology

## 2022-11-04 ENCOUNTER — Ambulatory Visit (INDEPENDENT_AMBULATORY_CARE_PROVIDER_SITE_OTHER): Payer: Federal, State, Local not specified - PPO | Admitting: Ophthalmology

## 2022-11-04 DIAGNOSIS — H35371 Puckering of macula, right eye: Secondary | ICD-10-CM | POA: Diagnosis not present

## 2022-11-04 DIAGNOSIS — H33332 Multiple defects of retina without detachment, left eye: Secondary | ICD-10-CM

## 2022-11-04 DIAGNOSIS — H3321 Serous retinal detachment, right eye: Secondary | ICD-10-CM

## 2022-11-04 DIAGNOSIS — H43812 Vitreous degeneration, left eye: Secondary | ICD-10-CM | POA: Diagnosis not present

## 2022-11-04 DIAGNOSIS — H35413 Lattice degeneration of retina, bilateral: Secondary | ICD-10-CM

## 2022-11-04 DIAGNOSIS — Z961 Presence of intraocular lens: Secondary | ICD-10-CM

## 2022-11-08 ENCOUNTER — Encounter (INDEPENDENT_AMBULATORY_CARE_PROVIDER_SITE_OTHER): Payer: Self-pay | Admitting: Ophthalmology

## 2022-11-17 ENCOUNTER — Ambulatory Visit: Payer: Federal, State, Local not specified - PPO | Admitting: Podiatry

## 2022-11-17 DIAGNOSIS — L603 Nail dystrophy: Secondary | ICD-10-CM | POA: Diagnosis not present

## 2022-11-17 DIAGNOSIS — L608 Other nail disorders: Secondary | ICD-10-CM

## 2022-11-17 NOTE — Patient Instructions (Signed)
Soak Instructions    THE DAY AFTER THE PROCEDURE  Place 1/4 cup of epsom salts in a quart of warm tap water.  Submerge your foot or feet with outer bandage intact for the initial soak; this will allow the bandage to become moist and wet for easy lift off.  Once you remove your bandage, continue to soak in the solution for 20 minutes.  This soak should be done twice a day.  Next, remove your foot or feet from solution, blot dry the affected area and cover.  You may use a band aid large enough to cover the area or use gauze and tape.  Apply other medications to the area as directed by the doctor such as polysporin neosporin.  IF YOUR SKIN BECOMES IRRITATED WHILE USING THESE INSTRUCTIONS, IT IS OKAY TO SWITCH TO  WHITE VINEGAR AND WATER. Or you may use antibacterial soap and water to keep the toe clean  Monitor for any signs/symptoms of infection. Call the office immediately if any occur or go directly to the emergency room. Call with any questions/concerns.  --  Fungal Nail Infection A fungal nail infection is a common infection of the toenails or fingernails. This condition affects toenails more often than fingernails. It often affects the great, or big, toes. More than one nail may be infected. The condition can be passed from person to person (is contagious). What are the causes? This condition is caused by a fungus, such as yeast or molds. Several types of fungi can cause the infection. These fungi are common in moist and warm areas. If your hands or feet come into contact with the fungus, it may get into a crack in your fingernail or toenail or in the surrounding skin, and cause an infection. What increases the risk? The following factors may make you more likely to develop this condition: Being of older age. Having certain medical conditions, such as: Athlete's foot. Diabetes. Poor circulation. A weak body defense system (immune system). Walking barefoot in areas where the fungus  thrives, such as showers or locker rooms. Wearing shoes and socks that cause your feet to sweat. Having a nail injury or a recent nail surgery. What are the signs or symptoms? Symptoms of this condition include: A pale spot on the nail. Thickening of the nail. A nail that becomes yellow, brown, or white. A brittle or ragged nail edge. A nail that has lifted away from the nail bed. How is this diagnosed? This condition is diagnosed with a physical exam. Your health care provider may take a scraping or clipping from your nail to test for the fungus. How is this treated? Treatment is not needed for mild infections. If you have significant nail changes, treatment may include: Antifungal medicines taken by mouth (orally). You may need to take the medicine for several weeks or several months, and you may not see the results for a long time. These medicines can cause side effects. Ask your health care provider what problems to watch for. Antifungal nail polish or nail cream. These may be used along with oral antifungal medicines. Laser treatment of the nail. Surgery to remove the nail. This may be needed for the most severe infections. It can take a long time, usually up to a year, for the infection to go away. The infection may also come back. Follow these instructions at home: Medicines Take or apply over-the-counter and prescription medicines only as told by your health care provider. Ask your health care provider about using  over-the-counter mentholated ointment on your nails. Nail care Trim your nails often. Wash and dry your hands and feet every day. Keep your feet dry. To do this: Wear absorbent socks, and change your socks frequently. Wear shoes that allow air to circulate, such as sandals or canvas tennis shoes. Throw out old shoes. If you go to a nail salon, make sure you choose one that uses clean instruments. Use antifungal foot powder on your feet and in your shoes. General  instructions Do not share personal items, such as towels or nail clippers. Do not walk barefoot in shower rooms or locker rooms. Wear rubber gloves if you are working with your hands in wet areas. Keep all follow-up visits. This is important. Contact a health care provider if: You have redness, pain, or pus near the toenail or fingernail. Your infection is not getting better, or it is getting worse after several months. You have more circulation problems near the toenail or fingernail. You have brown or black discoloration of the nail that spreads to the surrounding skin. Summary A fungal nail infection is a common infection of the toenails or fingernails. Treatment is not needed for mild infections. If you have significant nail changes, treatment may include taking medicine orally and applying medicine to your nails. It can take a long time, usually up to a year, for the infection to go away. The infection may also come back. Take or apply over-the-counter and prescription medicines only as told by your health care provider. This information is not intended to replace advice given to you by your health care provider. Make sure you discuss any questions you have with your health care provider. Document Revised: 05/20/2020 Document Reviewed: 05/20/2020 Elsevier Patient Education  2024 [employer].

## 2022-11-19 NOTE — Progress Notes (Signed)
Subjective:   Patient ID: Gail Duncan, female   DOB: 57 y.o.   MRN: [id number]   HPI No chief complaint on file.  The patient presents with a longstanding toenail fungus that had been improving but recently worsened. They have been applying the milky sap from a fig tree to the affected area. The patient also reports dropping a phone on the toe, causing the toe to turn black and the toenail to rip. The patient denies pain and drainage from the area. They also note a black line in the middle of the toenail. The patient has a history of successfully treating a toenail fungus with oral medication approximately 20 years ago.   Review of Systems  All other systems reviewed and are negative.  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   GERD (gastroesophageal reflux disease)    Hearing loss in right ear    Unknown cause, had full work up with Specialist in [place]   History of chicken pox    as child per pt   Pneumonia    20  yrs ago per pt on 08-09-2020   Retinal detachment    right eye 07-2017 per pt   Wears glasses     Past Surgical History:  Procedure Laterality Date   ABDOMINAL EXPLORATION SURGERY     20 yrs ago per pt on 08-09-2020   ABLATION     uterine, 1=9 to 10 yrs ago per pt on 08-09-2020   ANAL FISTULOTOMY N/A 08/15/2020   Procedure: FISTULOTOMY;  Surgeon: Romie Levee, MD;  Location: [facility];  Service: General;  Laterality: N/A;   CATARACT EXTRACTION Right 11/23/2017   Dr. Elmer Picker   CATARACT EXTRACTION Left 01/2020   Dr. Elmer Picker   DENTAL SURGERY     root canal 2020, tooth extraction 17 yrs ago as of 08-09-2020   EYE SURGERY Right 11/23/2017   Cat Sx - Dr. Elmer Picker   EYE SURGERY Left 01/2020   Cat Sx - Dr. Elmer Picker   EYE SURGERY Right 08/09/2017   SBP+PPV - Dr. Rennis Chris   GAS INSERTION Right 08/09/2017   Procedure: INSERTION OF GAS;  Surgeon: Rennis Chris, MD;  Location: [facility] OR;  Service: Ophthalmology;  Laterality: Right;   LASER  PHOTO ABLATION Left 08/09/2017   Procedure: LASER PHOTO ABLATION;  Surgeon: Rennis Chris, MD;  Location: [facility] OR;  Service: Ophthalmology;  Laterality: Left;   miscarriage     210 yrs ago per on on 08-09-2020   RECTAL EXAM UNDER ANESTHESIA N/A 08/15/2020   Procedure: EXAM UNDER ANESTHESIA;  Surgeon: Romie Levee, MD;  Location: [facility];  Service: General;  Laterality: N/A;   RETINAL DETACHMENT SURGERY Right 08/09/2017   SBP+PPV - Dr. Rennis Chris   SCLERAL BUCKLE WITH POSSIBLE 25 GAUGE PARS PLANA VITRECTOMY Right 08/09/2017   Procedure: SCLERAL BUCKLE WITH 25 GAUGE PARS PLANA VITRECTOMY AND ENDOLASER;  Surgeon: Rennis Chris, MD;  Location: [facility] OR;  Service: Ophthalmology;  Laterality: Right;   YAG LASER APPLICATION Right 06/14/2019     Current Outpatient Medications:    BIOTIN PO, 8 mg daily., Disp: , Rfl:    Cholecalciferol (VITAMIN D3) 125 MCG (5000 UT) CAPS, Take 1 capsule (5,000 Units total) by mouth 3 (three) times a week., Disp: 30 capsule, Rfl:    l-methylfolate-B6-B12 (METANX) 3-35-2 MG TABS tablet, Take 1 tablet by mouth once a week. 2 times a week, Disp: , Rfl:    Magnesium Citrate (CITROMA PO),  3 x week, Disp: , Rfl:    OVER THE COUNTER MEDICATION, Neoceol  collagen liquid .05 fluid ounce per 1 tablespoon takes 5-6 x week, Disp: , Rfl:    pantoprazole (PROTONIX) 20 MG tablet, Take 1 tablet by mouth once a week. 1-2 times a week, Disp: , Rfl:    Phytonadione (MEPHYTON PO), vitamin K, Disp: , Rfl:   Allergies  Allergen Reactions   Asa [Aspirin] Other (See Comments)    Contraindicated due to hearing loss           Objective:  Physical Exam  General: AAO x3, NAD  Dermatological: Right big toenail exhibits damage, fungal infection, and is loose.  On the second toenail there is a very faint hyperpigmented area on the toenail.  There is no edema, erythema there is no extension of any hyperpigmentation of the surrounding skin.    Vascular: Dorsalis Pedis  artery and Posterior Tibial artery pedal pulses are 2/4 bilateral with immedate capillary fill time.There is no pain with calf compression, swelling, warmth, erythema.   Neruologic: Grossly intact via light touch bilateral.   Musculoskeletal: No gross boney pedal deformities bilateral. No pain, crepitus, or limitation noted with foot and ankle range of motion bilateral. Muscular strength 5/5 in all groups tested bilateral.  Gait: Unassisted, Nonantalgic.       Assessment:   Onychodystrophy, toenail discoloration     Plan:  Onychomycosis and Traumatic Onycholysis Chronic toenail fungus with recent trauma causing partial nail avulsion. No signs of infection. -Trimmed loose nail and sent for culture and biopsy to guide further treatment.  Discussed oral, topical as well as alternative treatment. -Advised to soak foot in Epsom salt and vinegar solution, ensuring thorough drying afterwards.  Melanonychia Dark line noted on another toenail-second digit -Monitor closely for any progression or worsening. -Discussed biopsy.  Will likely proceed with this  Follow-up after nail culture or sooner if any issues were to arise.  Vivi Barrack DPM

## 2022-11-30 ENCOUNTER — Other Ambulatory Visit: Payer: Self-pay

## 2022-11-30 ENCOUNTER — Emergency Department (HOSPITAL_COMMUNITY): Payer: Federal, State, Local not specified - PPO

## 2022-11-30 ENCOUNTER — Emergency Department (HOSPITAL_COMMUNITY)
Admission: EM | Admit: 2022-11-30 | Discharge: 2022-11-30 | Disposition: A | Discharge status: Home / Self Care | Payer: Worker's Compensation

## 2022-11-30 ENCOUNTER — Encounter (HOSPITAL_COMMUNITY): Payer: Self-pay

## 2022-11-30 DIAGNOSIS — W108XXA Fall (on) (from) other stairs and steps, initial encounter: Secondary | ICD-10-CM | POA: Insufficient documentation

## 2022-11-30 DIAGNOSIS — S93602A Unspecified sprain of left foot, initial encounter: Secondary | ICD-10-CM | POA: Insufficient documentation

## 2022-11-30 DIAGNOSIS — Y990 Civilian activity done for income or pay: Secondary | ICD-10-CM | POA: Insufficient documentation

## 2022-11-30 DIAGNOSIS — Z043 Encounter for examination and observation following other accident: Secondary | ICD-10-CM | POA: Diagnosis not present

## 2022-11-30 DIAGNOSIS — S8990XA Unspecified injury of unspecified lower leg, initial encounter: Secondary | ICD-10-CM | POA: Diagnosis present

## 2022-11-30 DIAGNOSIS — S6392XA Sprain of unspecified part of left wrist and hand, initial encounter: Secondary | ICD-10-CM | POA: Diagnosis not present

## 2022-11-30 DIAGNOSIS — S99922A Unspecified injury of left foot, initial encounter: Secondary | ICD-10-CM | POA: Diagnosis not present

## 2022-11-30 NOTE — Discharge Instructions (Signed)
Ice and elevate to reduce swelling. Ibuprofen 600 mg (3 over-the-counter strength tablets) every 6 hours. Use the crutches to be weight bearing as tolerated. See your doctor as needed.

## 2022-11-30 NOTE — ED Triage Notes (Signed)
Patient stated she fractured her left foot on Friday after falling on stairs at work. Complaining of left wrist pain too, was not told it was not fractured but stated it still is swollen.

## 2022-11-30 NOTE — ED Provider Notes (Signed)
[facility] DEPARTMENT AT [facility] Provider Note   CSN: [id number] Arrival date & time: 11/30/22  1512     History  Chief Complaint  Patient presents with   Foot Pain    Gail Duncan is a 57 y.o. female.  Patient fell at work 3 days ago and was seen at [facility] after the injury. She was diagnosed with a left great toe fracture. She is here tonight with symptoms of tingling and coldness in the left lower leg concerned that it might represent a more serious injury. She states her left hand continues to be bruised and swollen. She states concern that the xray done on her hand missed a fracture.   The history is provided by the patient. No language interpreter was used.  Foot Pain       Home Medications Prior to Admission medications   Medication Sig Start Date End Date Taking? Authorizing Provider  BIOTIN PO 8 mg daily.    [provider]  Cholecalciferol (VITAMIN D3) 125 MCG (5000 UT) CAPS Take 1 capsule (5,000 Units total) by mouth 3 (three) times a week. 08/16/20   Romie Levee, MD  l-methylfolate-B6-B12 [facility]) 3-35-2 MG TABS tablet Take 1 tablet by mouth once a week. 2 times a week    [provider]  Magnesium Citrate (CITROMA PO) 3 x week    [provider]  OVER THE COUNTER MEDICATION Neoceol  collagen liquid .05 fluid ounce per 1 tablespoon takes 5-6 x week    [provider]  pantoprazole (PROTONIX) 20 MG tablet Take 1 tablet by mouth once a week. 1-2 times a week    [provider]  Phytonadione (MEPHYTON PO) vitamin K    [provider]      Allergies    Asa [aspirin]    Review of Systems   Review of Systems  Physical Exam Updated Vital Signs BP 136/78   Pulse 63   Temp 98.3 F (36.8 C) (Oral)   Resp 16   Ht 5\' 3"  (1.6 m)   Wt 76.2 kg   LMP 12/22/2014   SpO2 100%   BMI 29.76 kg/m  Physical Exam Vitals and nursing note reviewed.  Constitutional:      Appearance: Normal  appearance.  Musculoskeletal:     Comments: Left foot great toe buddy taped to 2nd toe. There is bruising on plantar surface. No swelling of forefoot or left lower leg. DP and PT pulses are present.  The left hand is swollen and bruised over thenar aspect. No bony deformity. Cap RF <2s.   Neurological:     Mental Status: She is alert.     ED Results / Procedures / Treatments   Labs (all labs ordered are listed, but only abnormal results are displayed) Labs Reviewed - No data to display  EKG None  Radiology DG Hand Complete Left  Result Date: 11/30/2022 CLINICAL DATA:  Larey Seat at work EXAM: LEFT HAND - COMPLETE 3+ VIEW COMPARISON:  None Available. FINDINGS: No acute displaced fracture or malalignment. Soft tissues are unremarkable. IMPRESSION: No acute osseous abnormality Electronically Signed   By: Jasmine Pang M.D.   On: 11/30/2022 22:22   DG Foot Complete Left  Result Date: 11/30/2022 CLINICAL DATA:  Injury EXAM: LEFT FOOT - COMPLETE 3+ VIEW COMPARISON:  None Available. FINDINGS: No definitive fracture or malalignment. Soft tissues are unremarkable IMPRESSION: No acute osseous abnormality Electronically Signed   By: Jasmine Pang M.D.   On: 11/30/2022 22:21  Procedures Procedures    Medications Ordered in ED Medications - No data to display  ED Course/ Medical Decision Making/ A&P Clinical Course as of 11/30/22 2242  Mon Nov 30, 2022  2030 Patient to ED concerned for missed injuries during [facility]. She was also told she would be referred to orthopedics but has not been referred. Imaging is unavailable for review here and is re-ordered.  [SU]  2031 Patient declined pain medication.  [SU]  2241 Xrays negative for any fractures. Patient and husband updated on results. Crutches provided for patient comfort.  [SU]    Clinical Course User Index [SU] Elpidio Anis, PA-C                                 Medical Decision Making Amount and/or  Complexity of Data Reviewed Radiology: ordered.           Final Clinical Impression(s) / ED Diagnoses Final diagnoses:  Sprain of left foot, initial encounter  Sprain of left hand, initial encounter    Rx / DC Orders ED Discharge Orders     None         Danne Harbor 11/30/22 2242    Durwin Glaze, MD 11/30/22 [phone]

## 2022-12-04 ENCOUNTER — Encounter: Payer: Self-pay | Admitting: Podiatry

## 2022-12-04 DIAGNOSIS — S92422A Displaced fracture of distal phalanx of left great toe, initial encounter for closed fracture: Secondary | ICD-10-CM | POA: Insufficient documentation

## 2023-01-05 ENCOUNTER — Other Ambulatory Visit: Payer: Self-pay | Admitting: Podiatry

## 2023-01-05 ENCOUNTER — Telehealth: Payer: Self-pay

## 2023-01-05 MED ORDER — EFINACONAZOLE 10 % EX SOLN: 1.0000 [drp] | Freq: Every day | CUTANEOUS | 11 refills | Status: DC

## 2023-01-05 NOTE — Telephone Encounter (Signed)
Patient called asking for nail culture results. She would like a topical medication to treat. Please advise -thanks

## 2023-01-06 ENCOUNTER — Other Ambulatory Visit: Payer: Self-pay | Admitting: Podiatry

## 2023-01-06 MED ORDER — EFINACONAZOLE 10 % EX SOLN: 1.0000 [drp] | Freq: Every day | CUTANEOUS | 11 refills | Status: DC

## 2023-02-04 ENCOUNTER — Ambulatory Visit (INDEPENDENT_AMBULATORY_CARE_PROVIDER_SITE_OTHER): Payer: Federal, State, Local not specified - PPO | Admitting: Urgent Care

## 2023-02-04 ENCOUNTER — Encounter: Payer: Self-pay | Admitting: Urgent Care

## 2023-02-04 VITALS — BP 133/86 | HR 81 | Temp 97.6°F | Wt 167.0 lb

## 2023-02-04 DIAGNOSIS — R42 Dizziness and giddiness: Secondary | ICD-10-CM

## 2023-02-04 DIAGNOSIS — M792 Neuralgia and neuritis, unspecified: Secondary | ICD-10-CM

## 2023-02-04 LAB — CBC WITH DIFFERENTIAL/PLATELET
Basophils Absolute: 0 10*3/uL (ref 0.0–0.1)
Basophils Relative: 0.5 % (ref 0.0–3.0)
Eosinophils Absolute: 0 10*3/uL (ref 0.0–0.7)
Eosinophils Relative: 0.3 % (ref 0.0–5.0)
HCT: 41.7 % (ref 36.0–46.0)
Hemoglobin: 14.3 g/dL (ref 12.0–15.0)
Lymphocytes Relative: 32.2 % (ref 12.0–46.0)
Lymphs Abs: 2 10*3/uL (ref 0.7–4.0)
MCHC: 34.4 g/dL (ref 30.0–36.0)
MCV: 89.1 fL (ref 78.0–100.0)
Monocytes Absolute: 0.4 10*3/uL (ref 0.1–1.0)
Monocytes Relative: 6.1 % (ref 3.0–12.0)
Neutro Abs: 3.9 10*3/uL (ref 1.4–7.7)
Neutrophils Relative %: 60.9 % (ref 43.0–77.0)
Platelets: 262 10*3/uL (ref 150.0–400.0)
RBC: 4.68 Mil/uL (ref 3.87–5.11)
RDW: 13.6 % (ref 11.5–15.5)
WBC: 6.3 10*3/uL (ref 4.0–10.5)

## 2023-02-04 LAB — POCT URINALYSIS DIPSTICK
Bilirubin, UA: NEGATIVE
Blood, UA: NEGATIVE
Glucose, UA: NEGATIVE
Ketones, UA: NEGATIVE
Leukocytes, UA: NEGATIVE
Nitrite, UA: NEGATIVE
Protein, UA: NEGATIVE
Spec Grav, UA: 1.01 (ref 1.010–1.025)
Urobilinogen, UA: 0.2 U/dL
pH, UA: 6 (ref 5.0–8.0)

## 2023-02-04 LAB — TSH: TSH: 1.07 u[IU]/mL (ref 0.35–5.50)

## 2023-02-04 LAB — B12 AND FOLATE PANEL
Folate: 11.5 ng/mL (ref >5.9)
Vitamin B-12: 273 pg/mL (ref 211–911)

## 2023-02-04 NOTE — Patient Instructions (Signed)
I suspect your blood pressure changes to have been related to the movement being performed at PT. Your blood pressure today is normal. We drew labs to assess for other possible causes of your intermittent dizziness. Your urine sample is normal.  I did place a referral for you to see neurology for a further workup for your continued nerve pain issues.

## 2023-02-04 NOTE — Progress Notes (Signed)
Established Patient Office Visit  Subjective:  Patient ID: Gail Duncan, female    DOB: 02-11-1966  Age: 57 y.o. MRN: [id number]  Chief Complaint  Patient presents with   Dizziness    Elevated BP during PT; 160/90    57yo female with a history of a recent fall at work, presents with an episode of dizziness and high blood pressure during her physical therapy session this morning. She states however this is not the first time this has occurred. The dizziness was first experienced during occupational therapy while performing wrist exercises back in Sept. The patient reported back in sept "feeling a rush of blood" and dizziness, which led to cessation of the exercise. The symptoms occurred with OT three times, the first time was 3 min in, the second was 5 min in and the third time was 8 minutes into the activity. All of these resolved after a few minutes of rest. This episode has recurred several times, each time during the same wrist exercise, but with increasing tolerance over time.  Today however, the patient experienced a similar episode of dizziness during a new foot exercise. The patient described a sensation of increased blood flow to the brain and hands, but denied any associated chest pain, shortness of breath, or palpitations. She states she was standing with her feet shoulder width apart on a slight inclined plane, and was squatting down when the feeling of dizziness occurred. The therapist sat her down and checked her BP. The patient's blood pressure was found to be elevated during this episode (160s systolic initially), which resolved after approximately 10-15 minutes of rest. In office at present time, BP is normal and patient denies any "rushing blood" or dizziness.  The patient has no known history of hypertension and does not take any medication for it. The patient also denied any history of headaches, but reported an ongoing underlying headache since the fall, which had intensified in  the past week. The patient denied any history of autoimmune disorders or smoking. The patient's daily morning routine includes breakfast and about 16oz of coffee intake. She also drank 20oz of water.   The patient's occupational therapy is due to a recent fall at work. The patient has been requesting a neurology consultation since the accident due to ongoing nerve pain issues, but this has not yet been arranged.  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   GERD (gastroesophageal reflux disease)    Hearing loss in right ear    Unknown cause, had full work up with Specialist in [place]   History of chicken pox    as child per pt   Pneumonia    20  yrs ago per pt on 08-09-2020   Retinal detachment    right eye 07-2017 per pt   Wears glasses    Past Surgical History:  Procedure Laterality Date   ABDOMINAL EXPLORATION SURGERY     20 yrs ago per pt on 08-09-2020   ABLATION  uterine, 1=9 to 10 yrs ago per pt on 08-09-2020   ANAL FISTULOTOMY N/A 08/15/2020   Procedure: FISTULOTOMY;  Surgeon: Romie Levee, MD;  Location[facility];  Service: General;  Laterality: N/A;   CATARACT EXTRACTION Right 11/23/2017   Dr. Elmer Picker   CATARACT EXTRACTION Left 01/2020   Dr. Elmer Picker   DENTAL SURGERY     root canal 2020, tooth extraction 17 yrs ago as of 08-09-2020   EYE SURGERY Right 11/23/2017   Cat Sx - Dr. Elmer Picker   EYE SURGERY Left 01/2020   Cat Sx - Dr. Elmer Picker   EYE SURGERY Right 08/09/2017   SBP+PPV  - Dr. Rennis Chris   GAS INSERTION Right 08/09/2017   Procedure: INSERTION OF GAS;  Surgeon: Rennis Chris, MD;  Location[facility] OR;  Service: Ophthalmology;  Laterality: Right;   LASER PHOTO ABLATION Left 08/09/2017   Procedure: LASER PHOTO ABLATION;  Surgeon: Rennis Chris, MD;  Location: [facility] OR;  Service: Ophthalmology;  Laterality: Left;   miscarriage     210 yrs ago per on on 08-09-2020   RECTAL EXAM UNDER ANESTHESIA N/A 08/15/2020   Procedure: EXAM UNDER ANESTHESIA;  Surgeon: Romie Levee, MD;  Location: [facility];  Service: General;  Laterality: N/A;   RETINAL DETACHMENT SURGERY Right 08/09/2017   SBP+PPV - Dr. Rennis Chris   SCLERAL BUCKLE WITH POSSIBLE 25 GAUGE PARS PLANA VITRECTOMY Right 08/09/2017   Procedure: SCLERAL BUCKLE WITH 25 GAUGE PARS PLANA VITRECTOMY AND ENDOLASER;  Surgeon: Rennis Chris, MD;  Location: [facility] OR;  Service: Ophthalmology;  Laterality: Right;   YAG LASER APPLICATION Right 06/14/2019   Social History   Tobacco Use   Smoking status: Never   Smokeless tobacco: Never  Vaping Use   Vaping status: Never Used  Substance Use Topics   Alcohol use: Yes    Comment: rare monthly   Drug use: No      ROS: as noted in HPI  Objective:     BP 133/86   Pulse 81   Temp 97.6 F (36.4 C)   Wt 167 lb (75.8 kg)   LMP 12/22/2014   SpO2 100%   BMI 29.58 kg/m  BP Readings from Last 3 Encounters:  02/04/23 133/86  11/30/22 136/78  05/13/22 128/84   Wt Readings from Last 3 Encounters:  02/04/23 167 lb (75.8 kg)  11/30/22 168 lb (76.2 kg)  05/13/22 169 lb 6.4 oz (76.8 kg)      Physical Exam Vitals and nursing note reviewed.  Constitutional:      General: She is not in acute distress.    Appearance: Normal appearance. She is not ill-appearing, toxic-appearing or diaphoretic.  HENT:     Head: Normocephalic and atraumatic.     Right Ear: External ear normal.     Left Ear: External ear normal.     Nose: Nose normal.     Mouth/Throat:      Mouth: Mucous membranes are moist.     Pharynx: Oropharynx is clear. No oropharyngeal exudate or posterior oropharyngeal erythema.  Eyes:     General: No scleral icterus.       Right eye: No discharge.        Left eye: No discharge.     Extraocular Movements: Extraocular movements intact.     Pupils: Pupils are equal, round, and reactive to light.  Neck:     Vascular: No carotid bruit.  Cardiovascular:     Rate and Rhythm: Normal rate and regular rhythm.  Pulses: Normal pulses.     Heart sounds: Normal heart sounds. No murmur heard.    No friction rub.  Pulmonary:     Effort: Pulmonary effort is normal. No respiratory distress.     Breath sounds: Normal breath sounds. No stridor. No wheezing, rhonchi or rales.  Chest:     Chest wall: No tenderness.  Musculoskeletal:        General: Tenderness (pt has abnormal tenderness to L wrist and L ankle) present.     Cervical back: Normal range of motion and neck supple. No rigidity or tenderness.  Lymphadenopathy:     Cervical: No cervical adenopathy.  Skin:    General: Skin is warm and dry.     Coloration: Skin is not jaundiced.     Findings: No bruising, erythema or rash.  Neurological:     General: No focal deficit present.     Mental Status: She is alert and oriented to person, place, and time.     Cranial Nerves: Cranial nerves 2-12 are intact. No cranial nerve deficit, dysarthria or facial asymmetry.     Sensory: No sensory deficit.     Motor: Weakness (moderate weakness noted to L hand grip strength and intrinsic muscles) present.     Coordination: Coordination normal. Rapid alternating movements normal.     Gait: Gait is intact. Gait normal.      Results for orders placed or performed in visit on 02/04/23  POCT urinalysis dipstick  Result Value Ref Range   Color, UA yellow    Clarity, UA clear    Glucose, UA Negative Negative   Bilirubin, UA neg    Ketones, UA neg    Spec Grav, UA 1.010 1.010 - 1.025   Blood, UA neg     pH, UA 6.0 5.0 - 8.0   Protein, UA Negative Negative   Urobilinogen, UA 0.2 0.2 or 1.0 E.U./dL   Nitrite, UA neg    Leukocytes, UA Negative Negative   Appearance     Odor      Last CBC Lab Results  Component Value Date   WBC 4.5 05/13/2022   HGB 12.8 05/13/2022   HCT 38.6 05/13/2022   MCV 88.6 05/13/2022   MCH 29.6 08/09/2017   RDW 13.7 05/13/2022   PLT 235.0 05/13/2022   Last metabolic panel Lab Results  Component Value Date   GLUCOSE 77 05/13/2022   NA 139 05/13/2022   K 4.0 05/13/2022   CL 103 05/13/2022   CO2 29 05/13/2022   BUN 12 05/13/2022   CREATININE 0.70 05/13/2022   GFR 96.99 05/13/2022   CALCIUM 9.5 05/13/2022   PROT 6.9 05/13/2022   ALBUMIN 4.3 05/13/2022   BILITOT 0.7 05/13/2022   ALKPHOS 75 05/13/2022   AST 16 05/13/2022   ALT 13 05/13/2022   ANIONGAP 8 08/09/2017   Last lipids Lab Results  Component Value Date   CHOL 251 (H) 05/13/2022   HDL 100.70 05/13/2022   LDLCALC 130 (H) 05/13/2022   TRIG 103.0 05/13/2022   CHOLHDL 2 05/13/2022   Last hemoglobin A1c Lab Results  Component Value Date   HGBA1C 4.9 05/13/2022      The ASCVD Risk score (Arnett DK, et al., 2019) failed to calculate for the following reasons:   The valid HDL cholesterol range is 20 to 100 mg/dL  Assessment & Plan:  Episode of dizziness -     CBC with Differential/Platelet -     COMPLETE METABOLIC PANEL WITH GFR -  Iron, TIBC and Ferritin Panel -     TSH -     B12 and Folate Panel -     POCT urinalysis dipstick  Nerve pain -     Ambulatory referral to Neurology  Dizziness and elevated blood pressure noted during physical therapy exercises. No associated chest pain, shortness of breath, or palpitations. No current headache. Overall patient is neurologically stable.  -Order blood work to rule out anemia and check B12 levels. -urine sample to assess for dehydration which appears normal in ofifce. -clinically suspect transient elevation in blood pressure  secondary to what sounds like the valsalva maneuver being performed at PT. Normal vitals in office. Normal cardiac exam -Advise patient to continue with physical therapy and monitor symptoms, may need to alter performance and avoid valsalva -Recommend follow-up with a neurologist, referral placed today. Question CRPS type 1? Will defer to neuro for further assessment.    Return in about 3 months (around 05/17/2023) for Annual Physical.   Maretta Bees, PA

## 2023-02-05 LAB — IRON,TIBC AND FERRITIN PANEL
%SAT: 36 % (ref 16–45)
Ferritin: 125 ng/mL (ref 16–232)
Iron: 116 ug/dL (ref 45–160)
TIBC: 319 ug/dL (ref 250–450)

## 2023-02-05 LAB — COMPLETE METABOLIC PANEL WITH GFR
AG Ratio: 1.7 (calc) (ref 1.0–2.5)
ALT: 28 U/L (ref 6–29)
AST: 25 U/L (ref 10–35)
Albumin: 4.8 g/dL (ref 3.6–5.1)
Alkaline phosphatase (APISO): 90 U/L (ref 37–153)
BUN: 14 mg/dL (ref 7–25)
CO2: 29 mmol/L (ref 20–32)
Calcium: 10.2 mg/dL (ref 8.6–10.4)
Chloride: 103 mmol/L (ref 98–110)
Creat: 0.67 mg/dL (ref 0.50–1.03)
Globulin: 2.8 g/dL (ref 1.9–3.7)
Glucose, Bld: 89 mg/dL (ref 65–99)
Potassium: 4 mmol/L (ref 3.5–5.3)
Sodium: 142 mmol/L (ref 135–146)
Total Bilirubin: 0.6 mg/dL (ref 0.2–1.2)
Total Protein: 7.6 g/dL (ref 6.1–8.1)
eGFR: 103 mL/min/{1.73_m2} (ref >=60)

## 2023-03-04 ENCOUNTER — Ambulatory Visit: Payer: Federal, State, Local not specified - PPO | Admitting: Podiatry

## 2023-03-06 ENCOUNTER — Encounter: Payer: Self-pay | Admitting: Family Medicine

## 2023-03-09 NOTE — Telephone Encounter (Signed)
 I have not prescribed either prescriptions for

## 2023-03-10 NOTE — Telephone Encounter (Signed)
 No further action needed.

## 2023-03-17 ENCOUNTER — Other Ambulatory Visit (INDEPENDENT_AMBULATORY_CARE_PROVIDER_SITE_OTHER): Payer: Federal, State, Local not specified - PPO

## 2023-03-17 DIAGNOSIS — E538 Deficiency of other specified B group vitamins: Secondary | ICD-10-CM | POA: Diagnosis not present

## 2023-03-17 LAB — VITAMIN B12: Vitamin B-12: 562 pg/mL (ref 211–911)

## 2023-03-19 ENCOUNTER — Ambulatory Visit: Payer: Federal, State, Local not specified - PPO | Admitting: Family Medicine

## 2023-04-01 ENCOUNTER — Ambulatory Visit: Payer: 59 | Admitting: Podiatry

## 2023-04-01 ENCOUNTER — Encounter: Payer: Self-pay | Admitting: Podiatry

## 2023-04-01 DIAGNOSIS — B351 Tinea unguium: Secondary | ICD-10-CM

## 2023-04-01 MED ORDER — EFINACONAZOLE 10 % EX SOLN: 1.0000 [drp] | Freq: Every day | CUTANEOUS | 11 refills | Status: DC

## 2023-04-02 ENCOUNTER — Telehealth: Payer: Self-pay

## 2023-04-02 NOTE — Telephone Encounter (Signed)
PA request received for Jublia 10% solution. PA submitted through covermymeds and waiting on response.  Gail Duncan (Key: [id number]) PA Case ID #: [id number] Rx #: [id number]

## 2023-04-04 NOTE — Progress Notes (Signed)
Subjective: Chief Complaint  Patient presents with   Nail Problem    RM#12 Follow up toe nails has regrown needs trim.States runs out of medication needs to know if you think it is working.   58 year old female presents to the office today with above concerns.  She recently started using the topical medication and states that the nails have not changed much.  She denies any pain to the nails and she has no swelling redness or drainage.  Was again seen by orthopedics for an injury to her left foot.  She has questions but no ongoing other treatments.  Objective: AAO x3, NAD DP/PT pulses palpable bilaterally, CRT less than 3 seconds The nails appear to be about the same.  They can be hypertrophic, dystrophic with yellow, brown discoloration.  There are some mild clear along the proximal nail fold.  There is no edema, erythema or signs of infection.  No open lesions. No pain with calf compression, swelling, warmth, erythema  Assessment: Onychomycosis  Plan: -All treatment options discussed with the patient including all alternatives, risks, complications.  -Continue routine debridements of the toenails.  I would continue the topical medication for now and discussed duration of use and we can use this up to about a year. -With her orthopedics for her foot issues, injuries she is currently being treated for this.  We did discuss neuromas in general as she had some questions but I will defer to her treating provider. -Patient encouraged to call the office with any questions, concerns, change in symptoms.   Vivi Barrack DPM

## 2023-05-07 DIAGNOSIS — G5762 Lesion of plantar nerve, left lower limb: Secondary | ICD-10-CM | POA: Insufficient documentation

## 2023-05-12 ENCOUNTER — Encounter (INDEPENDENT_AMBULATORY_CARE_PROVIDER_SITE_OTHER): Payer: Federal, State, Local not specified - PPO | Admitting: Ophthalmology

## 2023-05-12 DIAGNOSIS — H3321 Serous retinal detachment, right eye: Secondary | ICD-10-CM

## 2023-05-12 DIAGNOSIS — Z961 Presence of intraocular lens: Secondary | ICD-10-CM

## 2023-05-12 DIAGNOSIS — H43812 Vitreous degeneration, left eye: Secondary | ICD-10-CM

## 2023-05-12 DIAGNOSIS — H35413 Lattice degeneration of retina, bilateral: Secondary | ICD-10-CM

## 2023-05-12 DIAGNOSIS — H35371 Puckering of macula, right eye: Secondary | ICD-10-CM

## 2023-05-12 DIAGNOSIS — H33332 Multiple defects of retina without detachment, left eye: Secondary | ICD-10-CM

## 2023-08-16 NOTE — Progress Notes (Unsigned)
 Patient name: Gail Duncan MRN: [id number] DOB: 15-Apr-1965 Sex: female  REASON FOR CONSULT: Discoloration of left foot  HPI: Gail Duncan is a 57 y.o. female, with no significant medical history that presents for evaluation of discoloration to her left foot.  She has been referred by [facility].  She had a mechanical fall in the fall 2024 down stairs.  Looks like she has had a displaced great toe distal phalanx intra-articular fracture and also found to have a Morton's neuroma on the left foot.  States since about March she has noticed discoloration to the bottom of her foot at the plantar surface.  This has been very painful.  Has trouble walking on the plantar surface and walks on the heel.  Tearful today.  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   GERD (gastroesophageal reflux disease)    Hearing loss in right ear    Unknown cause, had full work up with Specialist in [place]   History of chicken pox    as child per pt   Pneumonia    20  yrs ago per pt on 08-09-2020   Retinal detachment    right eye 07-2017 per pt   Wears glasses     Past Surgical History:  Procedure Laterality Date   ABDOMINAL EXPLORATION SURGERY     20 yrs ago per pt on 08-09-2020   ABLATION     uterine, 1=9 to 10 yrs ago per pt on 08-09-2020   ANAL FISTULOTOMY N/A 08/15/2020   Procedure: FISTULOTOMY;  Surgeon: Joyce Nixon, MD;  Location: [facility];  Service: General;  Laterality: N/A;   CATARACT EXTRACTION Right 11/23/2017   Dr. Lasandra Points   CATARACT EXTRACTION Left 01/2020   Dr. Lasandra Points   DENTAL SURGERY     root canal 2020, tooth extraction 17 yrs ago as of 08-09-2020   EYE SURGERY Right 11/23/2017   Cat Sx - Dr. Lasandra Points   EYE SURGERY Left 01/2020   Cat Sx - Dr. Lasandra Points   EYE SURGERY Right 08/09/2017   SBP+PPV - Dr. Ronelle Coffee   GAS INSERTION Right 08/09/2017   Procedure: INSERTION OF GAS;  Surgeon: Ronelle Coffee, MD;  Location: [facility] OR;  Service: Ophthalmology;   Laterality: Right;   LASER PHOTO ABLATION Left 08/09/2017   Procedure: LASER PHOTO ABLATION;  Surgeon: Ronelle Coffee, MD;  Location: [facility];  Service: Ophthalmology;  Laterality: Left;   miscarriage     210 yrs ago per on on 08-09-2020   RECTAL EXAM UNDER ANESTHESIA N/A 08/15/2020   Procedure: EXAM UNDER ANESTHESIA;  Surgeon: Joyce Nixon, MD;  Location: [facility];  Service: General;  Laterality: N/A;   RETINAL DETACHMENT SURGERY Right 08/09/2017   SBP+PPV - Dr. Ronelle Coffee   SCLERAL BUCKLE WITH POSSIBLE 25 GAUGE PARS PLANA VITRECTOMY Right 08/09/2017   Procedure: SCLERAL BUCKLE WITH 25 GAUGE PARS PLANA VITRECTOMY AND ENDOLASER;  Surgeon: Ronelle Coffee, MD;  Location: [facility] OR;  Service: Ophthalmology;  Laterality: Right;   YAG LASER APPLICATION Right 06/14/2019    Family History  Problem Relation Age of Onset   Diabetes Mother    Prostate cancer Father    Hypertension Father    Retinal detachment Father    Lung cancer Paternal Grandfather    Amblyopia Neg Hx    Blindness Neg Hx    Cataracts Neg Hx    Glaucoma Neg Hx    Macular degeneration Neg Hx    Strabismus Neg Hx  Retinitis pigmentosa Neg Hx     SOCIAL HISTORY: Social History   Socioeconomic History   Marital status: Married    Spouse name: Not on file   Number of children: Not on file   Years of education: Not on file   Highest education level: Not on file  Occupational History   Not on file  Tobacco Use   Smoking status: Never   Smokeless tobacco: Never  Vaping Use   Vaping status: Never Used  Substance and Sexual Activity   Alcohol use: Yes    Comment: rare monthly   Drug use: No   Sexual activity: Yes  Other Topics Concern   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 Bachelor's degree    Moved to [place], from [place]  in July 2016.    EtOH use occasional,, nonsmoker, denies recreational drugs.   Drinks caffeinated beverages, uses herbal  remedies, takes daily vitamins   Patient wears her seatbelt, wears a bike,, exercises at least 3 times a week. There is a smoke alarm in her home.   Social Drivers of Corporate investment banker Strain: Not on file  Food Insecurity: Not on file  Transportation Needs: Not on file  Physical Activity: Not on file  Stress: Not on file  Social Connections: Not on file  Intimate Partner Violence: Not on file    Allergies  Allergen Reactions   Asa [Aspirin] Other (See Comments)    Contraindicated due to hearing loss    Current Outpatient Medications  Medication Sig Dispense Refill   BIOTIN PO 8 mg daily.     Cholecalciferol (VITAMIN D3) 125 MCG (5000 UT) CAPS Take 1 capsule (5,000 Units total) by mouth 3 (three) times a week. 30 capsule    Efinaconazole  10 % SOLN Apply 1 drop topically daily. 4 mL 11   Efinaconazole  10 % SOLN Apply 1 drop topically daily. 8 mL 11   l-methylfolate-B6-B12 (METANX) 3-35-2 MG TABS tablet Take 1 tablet by mouth once a week. 2 times a week     Magnesium Citrate (CITROMA PO) 3 x week     OVER THE COUNTER MEDICATION Neoceol  collagen liquid .05 fluid ounce per 1 tablespoon takes 5-6 x week     pantoprazole (PROTONIX) 20 MG tablet Take 1 tablet by mouth once a week. 1-2 times a week     Phytonadione (MEPHYTON PO) vitamin K     No current facility-administered medications for this visit.    REVIEW OF SYSTEMS:  [X]  denotes positive finding, [ ]  denotes negative finding Cardiac  Comments:  Chest pain or chest pressure:    Shortness of breath upon exertion:    Short of breath when lying flat:    Irregular heart rhythm:        Vascular    Pain in calf, thigh, or hip brought on by ambulation:    Pain in feet at night that wakes you up from your sleep:     Blood clot in your veins:    Leg swelling:         Pulmonary    Oxygen at home:    Productive cough:     Wheezing:         Neurologic    Sudden weakness in arms or legs:     Sudden numbness in arms  or legs:     Sudden onset of difficulty speaking or slurred speech:    Temporary loss of vision  in one eye:     Problems with dizziness:         Gastrointestinal    Blood in stool:     Vomited blood:         Genitourinary    Burning when urinating:     Blood in urine:        Psychiatric    Major depression:         Hematologic    Bleeding problems:    Problems with blood clotting too easily:        Skin    Rashes or ulcers:        Constitutional    Fever or chills:      PHYSICAL EXAM: There were no vitals filed for this visit.  GENERAL: The patient is a well-nourished female, in no acute distress. The vital signs are documented above. CARDIAC: There is a regular rate and rhythm.  VASCULAR:  Bilateral femoral pulses palpable Bilateral DP pulses palpable Purple hue to the bottom of the left plantar surface PULMONARY: No respiratory distress. ABDOMEN: Soft and non-tender. MUSCULOSKELETAL: There are no major deformities or cyanosis. NEUROLOGIC: No focal weakness or paresthesias are detected. SKIN: There are no ulcers or rashes noted. PSYCHIATRIC: The patient has a normal affect.  DATA:   ABIs today are 0.99 on the right triphasic and 0.9 on the left triphasic with a toe pressure of 105 on the right and 83 on the left  Assessment/Plan:  58 y.o. female, with no significant medical history that presents for evaluation of discoloration to her left foot.  She has been referred by [facility].  She had a mechanical fall in the fall 2024 down stairs.  Looks like she has had a displaced great toe distal phalanx intra-articular fracture and also found to have a Morton's neuroma on the left foot.  States since about March she has noticed discoloration to the bottom of her foot that she states it painful.  Her ABIs are essentially normal on the left and she has a palpable pulse at the ankle with a triphasic waveform.  The only abnormality she does have a slightly blunted toe  tracing.  Discussed we could certainly work her up for an atheroembolic event and I have discussed a CTA abdomen pelvis with runoff.  This could also be related to small vessel disease.  If this is normal I suspect she likely has some chronic underlying venous disease that may describe the discoloration as she has some swelling in her leg as well and could all be related to venous HTN.   Young Hensen, MD [facility] [place] Office: [phone]

## 2023-08-17 ENCOUNTER — Ambulatory Visit (INDEPENDENT_AMBULATORY_CARE_PROVIDER_SITE_OTHER): Payer: Worker's Compensation | Admitting: Vascular Surgery

## 2023-08-17 ENCOUNTER — Ambulatory Visit
Admission: RE | Admit: 2023-08-17 | Discharge: 2023-08-17 | Disposition: A | Discharge status: Home / Self Care | Payer: Worker's Compensation | Source: Ambulatory Visit | Attending: Vascular Surgery | Admitting: Vascular Surgery

## 2023-08-17 ENCOUNTER — Encounter: Payer: Self-pay | Admitting: Vascular Surgery

## 2023-08-17 ENCOUNTER — Other Ambulatory Visit: Payer: Self-pay

## 2023-08-17 VITALS — BP 147/87 | HR 80 | Temp 97.9°F | Resp 20 | Ht 63.0 in | Wt 175.5 lb

## 2023-08-17 DIAGNOSIS — L819 Disorder of pigmentation, unspecified: Secondary | ICD-10-CM

## 2023-08-17 LAB — VAS US ABI WITH/WO TBI
Left ABI: 0.9
Right ABI: 0.99

## 2023-08-18 ENCOUNTER — Encounter (INDEPENDENT_AMBULATORY_CARE_PROVIDER_SITE_OTHER): Admitting: Ophthalmology

## 2023-08-27 ENCOUNTER — Ambulatory Visit: Admitting: Podiatry

## 2023-09-10 DIAGNOSIS — F419 Anxiety disorder, unspecified: Secondary | ICD-10-CM | POA: Insufficient documentation

## 2023-09-21 ENCOUNTER — Ambulatory Visit: Admitting: Podiatry

## 2023-10-27 ENCOUNTER — Other Ambulatory Visit: Payer: Self-pay

## 2023-10-27 DIAGNOSIS — L819 Disorder of pigmentation, unspecified: Secondary | ICD-10-CM

## 2023-11-10 NOTE — Progress Notes (Shared)
 [facility] - Clinic Note  11/17/2023     CHIEF COMPLAINT Patient presents for No chief complaint on file.   HISTORY OF PRESENT ILLNESS: Gail Duncan is a 58 y.o. female who presents to the clinic today for:   Pt states she had Yag OS, she states the distortion in the right eye is better than it was last time she was here   Referring physician: Catherine Fuller A, DO [address] [place],  [place] [address]  HISTORICAL INFORMATION:   Selected notes from the MEDICAL RECORD NUMBER Referred by ED for possible RD   CURRENT MEDICATIONS: No current outpatient medications on file. (Ophthalmic Drugs)   No current facility-administered medications for this visit. (Ophthalmic Drugs)   Current Outpatient Medications (Other)  Medication Sig   BIOTIN PO 8 mg daily.   Cholecalciferol (VITAMIN D3) 125 MCG (5000 UT) CAPS Take 1 capsule (5,000 Units total) by mouth 3 (three) times a week.   gabapentin (NEURONTIN) 100 MG capsule Take 100 mg by mouth at bedtime.   l-methylfolate-B6-B12 (METANX) 3-35-2 MG TABS tablet Take 1 tablet by mouth once a week. 2 times a week   Magnesium Citrate (CITROMA PO) 3 x week   OVER THE COUNTER MEDICATION Neoceol  collagen liquid .05 fluid ounce per 1 tablespoon takes 5-6 x week   pantoprazole (PROTONIX) 20 MG tablet Take 1 tablet by mouth once a week. 1-2 times a week   Phytonadione (MEPHYTON PO) vitamin K   No current facility-administered medications for this visit. 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   GERD (gastroesophageal reflux disease)    Hearing loss in right ear    Unknown cause, had full work up with Specialist [place]   History of chicken pox    as child per pt   Pneumonia    20  yrs ago per pt on 08-09-2020   Retinal  detachment    right eye 07-2017 per pt   Wears glasses    Past Surgical History:  Procedure Laterality Date   ABDOMINAL EXPLORATION SURGERY     20 yrs ago per pt on 08-09-2020   ABLATION     uterine, 1=9 to 10 yrs ago per pt on 08-09-2020   ANAL FISTULOTOMY N/A 08/15/2020   Procedure: FISTULOTOMY;  Surgeon: Debby Hila, MD;  Location: [facility];  Service: General;  Laterality: N/A;   CATARACT EXTRACTION Right 11/23/2017   Dr. Cleatus   CATARACT EXTRACTION Left 01/2020   Dr. Cleatus   DENTAL SURGERY     root canal 2020, tooth extraction 17 yrs ago as of 08-09-2020   EYE SURGERY Right 11/23/2017   Cat Sx - Dr. Cleatus   EYE SURGERY Left 01/2020   Cat Sx - Dr. Cleatus   EYE SURGERY Right 08/09/2017   SBP+PPV - Dr. Redell Hans   GAS INSERTION Right 08/09/2017   Procedure: INSERTION OF GAS;  Surgeon: Hans Redell, MD;  Location: [facility] OR;  Service: Ophthalmology;  Laterality: Right;   LASER PHOTO ABLATION Left 08/09/2017   Procedure: LASER PHOTO ABLATION;  Surgeon: Hans Redell, MD;  Location: [facility] OR;  Service: Ophthalmology;  Laterality: Left;   miscarriage     210 yrs ago per on on 08-09-2020  RECTAL EXAM UNDER ANESTHESIA N/A 08/15/2020   Procedure: EXAM UNDER ANESTHESIA;  Surgeon: Debby Hila, MD;  Location: [facility];  Service: General;  Laterality: N/A;   RETINAL DETACHMENT SURGERY Right 08/09/2017   SBP+PPV - Dr. Redell Hans   SCLERAL BUCKLE WITH POSSIBLE 25 GAUGE PARS PLANA VITRECTOMY Right 08/09/2017   Procedure: SCLERAL BUCKLE WITH 25 GAUGE PARS PLANA VITRECTOMY AND ENDOLASER;  Surgeon: Hans Redell, MD;  Location: [facility] OR;  Service: Ophthalmology;  Laterality: Right;   YAG LASER APPLICATION Right 06/14/2019   FAMILY HISTORY Family History  Problem Relation Age of Onset   Diabetes Mother    Prostate cancer Father    Hypertension Father    Retinal detachment Father    Lung cancer Paternal Grandfather    Amblyopia Neg Hx    Blindness Neg  Hx    Cataracts Neg Hx    Glaucoma Neg Hx    Macular degeneration Neg Hx    Strabismus Neg Hx    Retinitis pigmentosa Neg Hx    SOCIAL HISTORY Social History   Tobacco Use   Smoking status: Never   Smokeless tobacco: Never  Vaping Use   Vaping status: Never Used  Substance Use Topics   Alcohol use: Yes    Comment: rare monthly   Drug use: No       OPHTHALMIC EXAM:  Not recorded    IMAGING AND PROCEDURES  Imaging and Procedures for @TODAY @          ASSESSMENT/PLAN:  No diagnosis found.   1. Rhegmatogenous retinal detachment, RIGHT eye  - bullous superotemporal mac on detachment, onset of foveal involvement Saturday, 01/30/17 by history  - detached from 1030-1200 oclock, large horseshoe tear from 1030-1100 and linear tear at 1130 within bed of lattice  - s/p SBP + PPV/EL/FAX/14% C3F8 OD, 06.10.19             - retina attached and in good position -- good buckle height and laser around breaks  - BCVA 20/20             - IOP OK today at 15  2.  ERM OD  - ERM w/ pucker and central thickening OD  - OCT shows mild progression of central thickening  - asymptomatic, no metamorphopsia  - BCVA 20/20 OD  - no indication for surgery at this time, patient agrees.   - monitor for now   - f/u in 6 months, sooner prn, DFE, OCT  3,4. Lattice degeneration OU;  w/ atrophic breaks left eye  - OD - superotemporal lattice at site of breaks and detachment  - OS - patches of lattice at 12 and 430 with a single atrophic breaks at each site  - s/p laser retinopexy OS at time of RD surgery (6.10.19)  - good laser in place   - no new RT/RD or lattice OU  - monitor  5. PVD OS  **pt presented acutely on 2.23.22 for persistent flashes of light and floaters OS x1 day**   - hemorrhagic PVD OS  - today symptoms stably improved -- decreased floaters, no photopsias  - BCVA 20/20 OS -- stable  - Discussed findings and prognosis  - No RT or RD on 360 scleral depressed exam  - Reviewed  s/s of RT/RD   - Strict return precautions for any such RT/RD signs/symptoms  6. Pseudophakia OU  - cataract OD progressively worsened following PPV  - s/p CE/IOL OU by expert surgeon Dr. Cleatus (OD 09.24.19;  OS 12.2021)             - s/p yag cap OD (04.14.21)  - s/p yag cap OS (2024)  - BCVA 20/20 OU - monitor  Ophthalmic Meds Ordered this visit:  No orders of the defined types were placed in this encounter.    No follow-ups on file.  There are no Patient Instructions on file for this visit.  Explained the diagnoses, plan, and follow up with the patient and they expressed understanding.  Patient expressed understanding of the importance of proper follow up care.   This document serves as a record of services personally performed by Redell JUDITHANN Hans, MD, PhD. It was created on their behalf by Almetta Pesa, an ophthalmic technician. The creation of this record is the provider's dictation and/or activities during the visit.    Electronically signed by: Almetta Pesa, OA, 11/10/23  9:43 AM   Redell JUDITHANN Hans, M.D., Ph.D. Diseases & Surgery of the Retina and Vitreous [facility]    Abbreviations: M myopia (nearsighted); A astigmatism; H hyperopia (farsighted); P presbyopia; Mrx spectacle prescription;  CTL contact lenses; OD right eye; OS left eye; OU both eyes  XT exotropia; ET esotropia; PEK punctate epithelial keratitis; PEE punctate epithelial erosions; DES dry eye syndrome; MGD meibomian gland dysfunction; ATs artificial tears; PFAT's preservative free artificial tears; NSC nuclear sclerotic cataract; PSC posterior subcapsular cataract; ERM epi-retinal membrane; PVD posterior vitreous detachment; RD retinal detachment; DM diabetes mellitus; DR diabetic retinopathy; NPDR non-proliferative diabetic retinopathy; PDR proliferative diabetic retinopathy; CSME clinically significant macular edema; DME diabetic macular edema; dbh dot blot hemorrhages; CWS cotton  wool spot; POAG primary open angle glaucoma; C/D cup-to-disc ratio; HVF humphrey visual field; GVF goldmann visual field; OCT optical coherence tomography; IOP intraocular pressure; BRVO Branch retinal vein occlusion; CRVO central retinal vein occlusion; CRAO central retinal artery occlusion; BRAO branch retinal artery occlusion; RT retinal tear; SB scleral buckle; PPV pars plana vitrectomy; VH Vitreous hemorrhage; PRP panretinal laser photocoagulation; IVK intravitreal kenalog ; VMT vitreomacular traction; MH Macular hole;  NVD neovascularization of the disc; NVE neovascularization elsewhere; AREDS age related eye disease study; ARMD age related macular degeneration; POAG primary open angle glaucoma; EBMD epithelial/anterior basement membrane dystrophy; ACIOL anterior chamber intraocular lens; IOL intraocular lens; PCIOL posterior chamber intraocular lens; Phaco/IOL phacoemulsification with intraocular lens placement; PRK photorefractive keratectomy; LASIK laser assisted in situ keratomileusis; HTN hypertension; DM diabetes mellitus; COPD chronic obstructive pulmonary disease

## 2023-11-17 ENCOUNTER — Encounter (INDEPENDENT_AMBULATORY_CARE_PROVIDER_SITE_OTHER): Admitting: Ophthalmology

## 2023-11-17 DIAGNOSIS — H33332 Multiple defects of retina without detachment, left eye: Secondary | ICD-10-CM

## 2023-11-17 DIAGNOSIS — Z961 Presence of intraocular lens: Secondary | ICD-10-CM

## 2023-11-17 DIAGNOSIS — H35371 Puckering of macula, right eye: Secondary | ICD-10-CM

## 2023-11-17 DIAGNOSIS — H43812 Vitreous degeneration, left eye: Secondary | ICD-10-CM

## 2023-11-17 DIAGNOSIS — H35413 Lattice degeneration of retina, bilateral: Secondary | ICD-10-CM

## 2023-11-17 DIAGNOSIS — H3321 Serous retinal detachment, right eye: Secondary | ICD-10-CM

## 2023-11-30 ENCOUNTER — Ambulatory Visit (HOSPITAL_COMMUNITY)
Admission: RE | Admit: 2023-11-30 | Discharge: 2023-11-30 | Disposition: A | Source: Ambulatory Visit | Attending: Cardiovascular Disease | Admitting: Cardiovascular Disease

## 2023-11-30 DIAGNOSIS — L819 Disorder of pigmentation, unspecified: Secondary | ICD-10-CM | POA: Insufficient documentation

## 2023-11-30 MED ORDER — IOHEXOL 350 MG/ML SOLN
100.0000 mL | Freq: Once | INTRAVENOUS | Status: AC | PRN
  Administered 2023-11-30: 100 mL via INTRAVENOUS

## 2023-12-07 ENCOUNTER — Ambulatory Visit: Admitting: Vascular Surgery

## 2024-01-10 ENCOUNTER — Ambulatory Visit: Attending: Surgery | Admitting: Surgery

## 2024-01-10 ENCOUNTER — Encounter: Payer: Self-pay | Admitting: Surgery

## 2024-01-10 VITALS — BP 127/85 | HR 84 | Temp 98.2°F | Ht 63.0 in | Wt 175.0 lb

## 2024-01-10 DIAGNOSIS — L819 Disorder of pigmentation, unspecified: Secondary | ICD-10-CM | POA: Diagnosis not present

## 2024-01-10 NOTE — Progress Notes (Signed)
 [facility]  Patient name: Gail Duncan MRN: [id number] DOB: [birth year] Sex: female   REASON FOR VISIT:    Follow-up  HISOTRY OF PRESENT ILLNESS:    Gail Duncan is a 58 y.o. female who was recently seen by my partner Dr. Gretta in June 2025 for evaluation of discoloration to her left foot.  She was referred by orthopedics.  She had a fall downstairs in 2024 and has had a displaced great toe and fracture.  She was also found to have a Morton's neuroma on the left foot.  Beginning in about March she noticed some color changes to the bottom of her foot which has been very painful.  She has had trouble walking because of the pain.  She had normal ABIs and toe pressures, however her toe pressures were slightly blunted.  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   GERD (gastroesophageal reflux disease)    Hearing loss in right ear    Unknown cause, had full work up with Specialist [place]   History of chicken pox    as child per pt   Pneumonia    20  yrs ago per pt on 08-09-2020   Retinal detachment    right eye 07-2017 per pt   Wears glasses      FAMILY HISTORY:   Family History  Problem Relation Age of Onset   Diabetes Mother    Prostate cancer Father    Hypertension Father    Retinal detachment Father    Lung cancer Paternal Grandfather    Amblyopia Neg Hx    Blindness Neg Hx    Cataracts Neg Hx    Glaucoma Neg Hx    Macular degeneration Neg Hx    Strabismus Neg Hx    Retinitis pigmentosa Neg Hx     SOCIAL HISTORY:   Social History   Tobacco Use   Smoking status: Never   Smokeless tobacco: Never  Substance Use Topics   Alcohol use: Yes    Comment: rare monthly     ALLERGIES:   Allergies  Allergen Reactions   Asa [Aspirin] Other (See Comments)     Contraindicated due to hearing loss     CURRENT MEDICATIONS:   Current Outpatient Medications  Medication Sig Dispense Refill   BIOTIN PO 8 mg daily.     Cholecalciferol (VITAMIN D3) 125 MCG (5000 UT) CAPS Take 1 capsule (5,000 Units total) by mouth 3 (three) times a week. 30 capsule    gabapentin (NEURONTIN) 100 MG capsule Take 100 mg by mouth at bedtime.     l-methylfolate-B6-B12 (METANX) 3-35-2 MG TABS tablet Take 1 tablet by mouth once a week. 2 times a week     Magnesium Citrate (CITROMA PO) 3 x week     OVER THE COUNTER MEDICATION Neoceol  collagen liquid .05 fluid ounce per 1 tablespoon takes 5-6 x week     pantoprazole (PROTONIX) 20 MG tablet Take 1 tablet by mouth once a week. 1-2 times a week     Phytonadione (MEPHYTON PO) vitamin K     No current facility-administered medications for this visit.    REVIEW OF SYSTEMS:   [X]  denotes positive finding, [ ]  denotes negative finding Cardiac  Comments:  Chest pain or chest pressure:    Shortness of breath upon  exertion:    Short of breath when lying flat:    Irregular heart rhythm:        Vascular    Pain in calf, thigh, or hip brought on by ambulation:    Pain in feet at night that wakes you up from your sleep:     Blood clot in your veins:    Leg swelling:         Pulmonary    Oxygen at home:    Productive cough:     Wheezing:         Neurologic    Sudden weakness in arms or legs:     Sudden numbness in arms or legs:     Sudden onset of difficulty speaking or slurred speech:    Temporary loss of vision in one eye:     Problems with dizziness:         Gastrointestinal    Blood in stool:     Vomited blood:         Genitourinary    Burning when urinating:     Blood in urine:        Psychiatric    Major depression:         Hematologic    Bleeding problems:    Problems with blood clotting too easily:        Skin    Rashes or ulcers:        Constitutional    Fever or chills:      PHYSICAL EXAM:    Vitals:   01/10/24 1004  BP: 127/85  Pulse: 84  Temp: 98.2 F (36.8 C)  SpO2: 97%  Weight: 175 lb (79.4 kg)  Height: 5' 3 (1.6 m)    GENERAL: The patient is a well-nourished female, in no acute distress. The vital signs are documented above. CARDIAC: There is a regular rate and rhythm.  VASCULAR: Palpable left dorsalis pedis pulse PULMONARY: Non-labored respirations MUSCULOSKELETAL: There are no major deformities or cyanosis. NEUROLOGIC: No focal weakness or paresthesias are detected. SKIN: There are no ulcers or rashes noted. PSYCHIATRIC: The patient has a normal affect.  STUDIES:   I have reviewed the following CTA: 1. No significant atherosclerotic vascular disease or flow-limiting stenosis identified within the major arteries. 2. The most distal tibial arteries are not evaluated, likely secondary to technical reasons. If additional imaging evaluation of these areas (the posterior tibial and dorsalis pedis arteries) is clinically indicated, consideration can be given toward further evaluation by arterial duplex ultrasound. MEDICAL ISSUES:   -After reviewing all of her studies including CT scan and ABIs and physical exam, I do not think there is a vascular component contributing to her symptoms.  -Because of her complaints, this sounds a lot like a complex regional pain syndrome secondary to her fall.  She says that she is scheduled to see a pain specialist in the near future which I think will be helpful  -She was quite tearful today and clearly is having some challenges with her left leg that began after her fall.  It would not be unreasonable to have her evaluated by psychiatry    Malvina Serene CLORE, MD, FACS [facility] [phone] Pager [phone]

## 2024-01-18 NOTE — Telephone Encounter (Signed)
 Pt lvm on triage line asking if we have any specific doctors who specialize in CRPS (hand and foot). Her current insurance is [employer]. Call back number is [phone].

## 2024-03-03 ENCOUNTER — Ambulatory Visit (INDEPENDENT_AMBULATORY_CARE_PROVIDER_SITE_OTHER): Admitting: Family Medicine

## 2024-03-03 ENCOUNTER — Encounter: Payer: Self-pay | Admitting: Family Medicine

## 2024-03-03 VITALS — BP 126/76 | HR 78 | Temp 98.1°F | Ht 63.0 in | Wt 177.0 lb

## 2024-03-03 DIAGNOSIS — M79642 Pain in left hand: Secondary | ICD-10-CM

## 2024-03-03 DIAGNOSIS — Z0000 Encounter for general adult medical examination without abnormal findings: Secondary | ICD-10-CM | POA: Diagnosis not present

## 2024-03-03 DIAGNOSIS — F4320 Adjustment disorder, unspecified: Secondary | ICD-10-CM | POA: Diagnosis not present

## 2024-03-03 DIAGNOSIS — Z1231 Encounter for screening mammogram for malignant neoplasm of breast: Secondary | ICD-10-CM | POA: Diagnosis not present

## 2024-03-03 DIAGNOSIS — M79672 Pain in left foot: Secondary | ICD-10-CM | POA: Diagnosis not present

## 2024-03-03 DIAGNOSIS — E663 Overweight: Secondary | ICD-10-CM

## 2024-03-03 DIAGNOSIS — Z131 Encounter for screening for diabetes mellitus: Secondary | ICD-10-CM

## 2024-03-03 DIAGNOSIS — Z23 Encounter for immunization: Secondary | ICD-10-CM | POA: Diagnosis not present

## 2024-03-03 DIAGNOSIS — Z13220 Encounter for screening for lipoid disorders: Secondary | ICD-10-CM

## 2024-03-03 DIAGNOSIS — K219 Gastro-esophageal reflux disease without esophagitis: Secondary | ICD-10-CM | POA: Diagnosis not present

## 2024-03-03 LAB — COMPREHENSIVE METABOLIC PANEL WITH GFR
ALT: 38 U/L — ABNORMAL HIGH (ref 3–35)
AST: 25 U/L (ref 5–37)
Albumin: 4.8 g/dL (ref 3.5–5.2)
Alkaline Phosphatase: 101 U/L (ref 39–117)
BUN: 16 mg/dL (ref 6–23)
CO2: 28 meq/L (ref 19–32)
Calcium: 9.9 mg/dL (ref 8.4–10.5)
Chloride: 102 meq/L (ref 96–112)
Creatinine, Ser: 0.64 mg/dL (ref 0.40–1.20)
GFR: 97.86 mL/min
Glucose, Bld: 83 mg/dL (ref 70–99)
Potassium: 4.3 meq/L (ref 3.5–5.1)
Sodium: 140 meq/L (ref 135–145)
Total Bilirubin: 0.6 mg/dL (ref 0.2–1.2)
Total Protein: 7.6 g/dL (ref 6.0–8.3)

## 2024-03-03 LAB — LIPID PANEL
Cholesterol: 292 mg/dL — ABNORMAL HIGH (ref 28–200)
HDL: 108.4 mg/dL
LDL Cholesterol: 163 mg/dL — ABNORMAL HIGH (ref 10–99)
NonHDL: 183.54
Total CHOL/HDL Ratio: 3
Triglycerides: 103 mg/dL (ref 10.0–149.0)
VLDL: 20.6 mg/dL (ref 0.0–40.0)

## 2024-03-03 LAB — CBC
HCT: 39.9 % (ref 36.0–46.0)
Hemoglobin: 13.5 g/dL (ref 12.0–15.0)
MCHC: 33.8 g/dL (ref 30.0–36.0)
MCV: 87.5 fl (ref 78.0–100.0)
Platelets: 248 K/uL (ref 150.0–400.0)
RBC: 4.56 Mil/uL (ref 3.87–5.11)
RDW: 13.4 % (ref 11.5–15.5)
WBC: 5.7 K/uL (ref 4.0–10.5)

## 2024-03-03 LAB — TSH: TSH: 1.29 u[IU]/mL (ref 0.35–5.50)

## 2024-03-03 LAB — HEMOGLOBIN A1C: Hgb A1c MFr Bld: 4.9 % (ref 4.6–6.5)

## 2024-03-03 MED ORDER — PANTOPRAZOLE SODIUM 20 MG PO TBEC: 20.0000 mg | DELAYED_RELEASE_TABLET | ORAL | 3 refills | Status: AC

## 2024-03-03 NOTE — Progress Notes (Signed)
 "    Patient ID: Gail Duncan, female  DOB: [birth year], 59 y.o.   MRN: [id number] Patient Care Team    Relationship Specialty Notifications Start End  Catherine Charlies LABOR, DO PCP - General Family Medicine  09/07/16   Latisha Medford, MD Consulting Physician Obstetrics and Gynecology  05/13/17   Carolee Lynwood JINNY DOUGLAS, MD Consulting Physician Orthopedic Surgery  04/12/19   Gastroenterology, [facility]    03/03/24     Chief Complaint  Patient presents with   Annual Exam    Pt is not fasting.     Subjective:  Gail Duncan is a 59 y.o.  Female  present for CPE. All past medical history, surgical history, allergies, family history, immunizations, medications and social history were updated in the electronic medical record today. All recent labs, ED visits and hospitalizations within the last year were reviewed.  Health maintenance:  Colonoscopy: 06/2017-  GI at [facility]. 5 yr per pt, but told every 10 yr  Mammogram: 10/24/2022 > ordered [facility] today Cervical cancer screening:  UTD 10/2022(Dr. Latisha)- records requested Immunizations: Tdap UTD 04/2017, declined flu shot. Shingrix declined, Prevnar declined,hepatitis B declined Infectious disease screening: HIV completed with pregnancy. Hep c completed Assistive device: none Oxygen [id number] Patient has a Dental home. Hospitalizations/ED visits: Reviewed  GERD: Patient has a history of reflux and has been prescribed pantoprazole 20 mg daily by GI team.  She is wondering if she can have refills for this today.  Anxiety and stress secondary to work-related injury over a year ago.  Patient reports she has not been able to get back to her normal activity level.  She had been completely independent in the past and enjoyed exercising and working out the yard , neither which she can do at this time secondary to work-related injury of her left arm and left foot.  She has been struggling with [employer]. Patient reports she never had any injury or problem with her  left hand or left foot in the past.  This provider has not seen her for any injuries or pain to her left foot or left hand in the past, other than a small bone cyst of the left middle finger 03/2019. Patient is desperately desiring to get back to work at all quality of life and activity level.  She reports she believes she needs to see a pain specialist since she still has ongoing pain and is not getting close to being back to her baseline.  Reports she has seen orthopedics recommended by her Worker's Comp. pain.  She also was evaluated by a pain specialist.  She reports Worker's [employer]. was going to pay for the pain specialist, but recently stated that they would not therefore she will need a referral placed for her to a specialist through on insurance.  She states she has an appointment with [facility] pain specialist next week.        03/03/2024    9:49 AM 02/04/2023   10:55 AM 05/13/2022   11:25 AM 10/01/2020    4:08 PM 09/30/2018    8:10 AM  Depression screen PHQ 2/9  Decreased Interest 2 0 0 0 0  Duncan, Depressed, Hopeless 2 0 0 0 0  PHQ - 2 Score 4 0 0 0 0  Altered sleeping 2      Tired, decreased energy 1      Change in appetite 1      Feeling bad or failure about yourself  2      Trouble  concentrating 1      Moving slowly or fidgety/restless 0      Suicidal thoughts 0      PHQ-9 Score 11      Difficult doing work/chores Somewhat difficult          08/15/2020    6:13 AM 05/21/2022    7:46 AM 02/04/2023   10:56 AM 08/05/2023   10:55 AM 03/03/2024    9:49 AM  Fall Risk  Falls in the past year?  0 0 0 0  Was there an injury with Fall?  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   Bad breath 10/01/2020   Bone cyst 03/24/2019   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   Closed fracture of distal phalanx of left great toe 12/04/2022   GERD (gastroesophageal reflux disease)    Hearing loss in right ear    Unknown cause, had full work up with Specialist in [place]   History of chicken pox    as child per pt   Pneumonia    20  yrs ago per pt on 08-09-2020   Retinal detachment    right eye 07-2017 per pt   Wears glasses    Allergies  Allergen Reactions   Asa [Aspirin] Other (See Comments)    Contraindicated due to hearing loss   Ascriptin Other (See Comments)    Other Reaction(s): other    Ascriptin    Contraindicated due to medical condition  Ascriptin   Past Surgical History:  Procedure Laterality Date   ABDOMINAL EXPLORATION SURGERY     20 yrs ago per pt on 08-09-2020   ABLATION     uterine, 1=9 to 10 yrs ago per pt on 08-09-2020   ANAL FISTULOTOMY N/A 08/15/2020   Procedure: FISTULOTOMY;  Surgeon: Debby Hila, MD;  Location: [facility];  Service: General;  Laterality: N/A;   CATARACT EXTRACTION Right 11/23/2017   Dr. Cleatus   CATARACT EXTRACTION Left 01/2020   Dr. Cleatus   DENTAL SURGERY     root canal 2020, tooth extraction 17 yrs ago as of 08-09-2020   EYE SURGERY Right 11/23/2017   Cat Sx - Dr. Cleatus   EYE SURGERY Left 01/2020   Cat Sx - Dr. Cleatus   EYE SURGERY Right 08/09/2017   SBP+PPV - Dr. Redell Hans   GAS INSERTION Right 08/09/2017   Procedure: INSERTION OF GAS;  Surgeon: Hans Redell, MD;  Location: [facility] OR;  Service: Ophthalmology;  Laterality: Right;   LASER PHOTO ABLATION Left 08/09/2017   Procedure: LASER PHOTO ABLATION;  Surgeon: Hans Redell, MD;  Location[facility];  Service: Ophthalmology;   Laterality: Left;   miscarriage     210 yrs ago per on on 08-09-2020   RECTAL EXAM UNDER ANESTHESIA N/A 08/15/2020   Procedure: EXAM UNDER ANESTHESIA;  Surgeon: Debby Hila, MD;  Location: [facility];  Service: General;  Laterality: N/A;   RETINAL DETACHMENT SURGERY Right 08/09/2017   SBP+PPV - Dr. Redell Hans   SCLERAL BUCKLE WITH POSSIBLE 25 GAUGE PARS PLANA VITRECTOMY Right 08/09/2017   Procedure: SCLERAL BUCKLE WITH 25 GAUGE PARS PLANA VITRECTOMY AND ENDOLASER;  Surgeon: Hans Redell, MD;  Location: [facility] OR;  Service: Ophthalmology;  Laterality: Right;   YAG LASER APPLICATION Right 06/14/2019   Family History  Problem Relation Age of Onset   Diabetes Mother    Prostate cancer Father    Hypertension Father    Retinal detachment Father    Lung cancer Paternal Grandfather    Amblyopia Neg Hx    Blindness Neg Hx    Cataracts Neg Hx    Glaucoma Neg Hx    Macular degeneration Neg Hx    Strabismus Neg Hx    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 Bachelor's degree    Moved to [place], from [place]  in July 2016.    EtOH use occasional,, nonsmoker, denies recreational drugs.   Drinks caffeinated beverages, uses herbal remedies, takes daily vitamins   Patient wears her seatbelt, wears a bike,, exercises at least 3 times a week. There is a smoke alarm in her home.    Allergies as of 03/03/2024       Reactions   Asa [aspirin] Other (See Comments)   Contraindicated due to hearing loss   Ascriptin Other (See Comments)   Other Reaction(s): other    Ascriptin    Contraindicated due to medical condition Ascriptin        Medication List        Accurate as of March 03, 2024  4:00 PM. If you have any questions, ask your nurse or doctor.          BIOTIN PO 8 mg daily.   CITROMA PO 3 x week   gabapentin 100 MG capsule Commonly known as: NEURONTIN Take 100 mg by mouth at  bedtime.   l-methylfolate-B6-B12 3-35-2 MG Tabs tablet Commonly known as: METANX Take 1 tablet by mouth once a week. 2 times a week   meloxicam 7.5 MG tablet Commonly known as: MOBIC TAKE 1 TABLET BY MOUTH TWICE DAILY FOR 2 WEEKS THEN AS NEEDED   MEPHYTON PO vitamin K   OVER THE COUNTER MEDICATION Neoceol  collagen liquid .05 fluid ounce per 1 tablespoon takes 5-6 x week   pantoprazole 20 MG tablet Commonly known as: PROTONIX Take 1 tablet (20 mg total) by mouth once a week. 1-2 times a week   Vitamin D3 125 MCG (5000 UT) Caps Take 1 capsule (5,000 Units total) by mouth 3 (three) times a week.        All past medical history, surgical history, allergies, family history, immunizations andmedications were updated in the EMR today and reviewed under the history and medication portions of their EMR.     No results found for this or any previous visit (from the past 2160 hours).   ROS: 14 pt review of systems performed and negative (unless mentioned in an HPI)  Objective: BP 126/76   Pulse 78   Temp 98.1 F (36.7 C)   Ht 5' 3 (1.6 m)   Wt 177 lb (80.3 kg)   LMP 12/22/2014   SpO2 98%   BMI 31.35 kg/m  Physical Exam Vitals and nursing note reviewed.  Constitutional:      General:  She is not in acute distress.    Appearance: Normal appearance. She is not ill-appearing or toxic-appearing.  HENT:     Head: Normocephalic and atraumatic.     Right Ear: Tympanic membrane, ear canal and external ear normal. There is no impacted cerumen.     Left Ear: Tympanic membrane, ear canal and external ear normal. There is no impacted cerumen.     Nose: No congestion or rhinorrhea.     Mouth/Throat:     Mouth: Mucous membranes are moist.     Pharynx: Oropharynx is clear. No oropharyngeal exudate or posterior oropharyngeal erythema.  Eyes:     General: No scleral icterus.       Right eye: No discharge.        Left eye: No discharge.     Extraocular Movements: Extraocular  movements intact.     Conjunctiva/sclera: Conjunctivae normal.     Pupils: Pupils are equal, round, and reactive to light.  Cardiovascular:     Rate and Rhythm: Normal rate and regular rhythm.     Pulses: Normal pulses.     Heart sounds: Normal heart sounds. No murmur heard.    No friction rub. No gallop.  Pulmonary:     Effort: Pulmonary effort is normal. No respiratory distress.     Breath sounds: Normal breath sounds. No stridor. No wheezing, rhonchi or rales.  Chest:     Chest wall: No tenderness.  Abdominal:     General: Abdomen is flat. Bowel sounds are normal. There is no distension.     Palpations: Abdomen is soft. There is no mass.     Tenderness: There is no abdominal tenderness. There is no right CVA tenderness, left CVA tenderness, guarding or rebound.     Hernia: No hernia is present.  Musculoskeletal:        General: No swelling, tenderness or deformity. Normal range of motion.     Cervical back: Normal range of motion and neck supple. No rigidity or tenderness.     Right lower leg: No edema.     Left lower leg: No edema.  Lymphadenopathy:     Cervical: No cervical adenopathy.  Skin:    General: Skin is warm and dry.     Coloration: Skin is not jaundiced or pale.     Findings: No bruising, erythema, lesion or rash.  Neurological:     General: No focal deficit present.     Mental Status: She is alert and oriented to person, place, and time. Mental status is at baseline.     Cranial Nerves: No cranial nerve deficit.     Sensory: No sensory deficit.     Motor: No weakness.     Coordination: Coordination normal.     Gait: Gait normal.     Deep Tendon Reflexes: Reflexes normal.  Psychiatric:        Mood and Affect: Mood normal.        Behavior: Behavior normal.        Thought Content: Thought content normal.        Judgment: Judgment normal.    No results found.  Assessment/plan: Anabela Crayton is a 59 y.o. female present for CPE Encounter for preventive health  examination Patient was encouraged to exercise greater than 150 minutes a week. Patient was encouraged to choose a diet filled with fresh fruits and vegetables, and lean meats. AVS provided to patient today for education/recommendation on gender specific health and safety maintenance. Colonoscopy: 06/2017-  GI at  [facility]. 5 yr per pt, but told every 10 yr  Mammogram: 10/24/2022 > ordered [facility] today Cervical cancer screening:  UTD 10/2022(Dr. Latisha)- records requested Immunizations: Tdap UTD 04/2017, declined flu shot. Shingrix declined, Prevnar declined,hepatitis B declined Infectious disease screening: HIV completed with pregnancy. Hep c completed  Need for vaccination for pneumococcus Declined Breast cancer screening by mammogram - MM 3D SCREENING MAMMOGRAM BILATERAL BREAST; Future Lipid screening/E66.3 - Lipid panel Diabetes mellitus screening/E66.3 - Hemoglobin A1c  Adjustment disorder, unspecified type - Ambulatory referral to Psychology  Gastroesophageal reflux disease without esophagitis Stable. Protonix 20 mg daily refilled for her  Left hand pain/left foot pain She has almost appointment with  pain specialist next week - Ambulatory referral to Pain Clinic   Return in about 1 year (around 03/04/2025) for cpe (20 min).  Electronically signed by: Charlies Bellini, DO [facility] "

## 2024-03-03 NOTE — Patient Instructions (Addendum)

## 2024-03-06 ENCOUNTER — Ambulatory Visit: Payer: Self-pay | Admitting: Family Medicine

## 2024-03-06 DIAGNOSIS — R7401 Elevation of levels of liver transaminase levels: Secondary | ICD-10-CM

## 2024-04-03 ENCOUNTER — Other Ambulatory Visit

## 2024-04-04 ENCOUNTER — Ambulatory Visit: Payer: Self-pay | Admitting: Family Medicine

## 2024-04-04 ENCOUNTER — Ambulatory Visit: Admitting: Psychology

## 2024-04-04 ENCOUNTER — Other Ambulatory Visit

## 2024-04-04 DIAGNOSIS — F4323 Adjustment disorder with mixed anxiety and depressed mood: Secondary | ICD-10-CM

## 2024-04-04 DIAGNOSIS — R7401 Elevation of levels of liver transaminase levels: Secondary | ICD-10-CM

## 2024-04-04 LAB — HEPATIC FUNCTION PANEL
ALT: 17 U/L (ref 3–35)
AST: 16 U/L (ref 5–37)
Albumin: 4.6 g/dL (ref 3.5–5.2)
Alkaline Phosphatase: 82 U/L (ref 39–117)
Bilirubin, Direct: 0.1 mg/dL (ref 0.1–0.3)
Total Bilirubin: 0.4 mg/dL (ref 0.2–1.2)
Total Protein: 7.3 g/dL (ref 6.0–8.3)

## 2024-04-18 ENCOUNTER — Ambulatory Visit: Admitting: Psychology

## 2024-11-07 ENCOUNTER — Ambulatory Visit

## 2025-03-26 ENCOUNTER — Encounter: Admitting: Family Medicine
